# Patient Record
Sex: Male | Born: 1965 | Race: Black or African American | Hispanic: No | Marital: Married | Smoking: Former smoker
Health system: Southern US, Community
[De-identification: ages and names within clinical notes are randomized; demographics above are authoritative.]

## PROBLEM LIST (undated history)

## (undated) DIAGNOSIS — T7840XA Allergy, unspecified, initial encounter: Secondary | ICD-10-CM

## (undated) DIAGNOSIS — B351 Tinea unguium: Secondary | ICD-10-CM

## (undated) DIAGNOSIS — R220 Localized swelling, mass and lump, head: Secondary | ICD-10-CM

## (undated) DIAGNOSIS — E119 Type 2 diabetes mellitus without complications: Secondary | ICD-10-CM

## (undated) DIAGNOSIS — I10 Essential (primary) hypertension: Secondary | ICD-10-CM

## (undated) DIAGNOSIS — J309 Allergic rhinitis, unspecified: Secondary | ICD-10-CM

## (undated) DIAGNOSIS — K219 Gastro-esophageal reflux disease without esophagitis: Secondary | ICD-10-CM

## (undated) DIAGNOSIS — E785 Hyperlipidemia, unspecified: Secondary | ICD-10-CM

## (undated) HISTORY — PX: HERNIA REPAIR: SHX51

## (undated) HISTORY — DX: Type 2 diabetes mellitus without complications: E11.9

## (undated) HISTORY — DX: Tinea unguium: B35.1

## (undated) HISTORY — DX: Gastro-esophageal reflux disease without esophagitis: K21.9

## (undated) HISTORY — DX: Essential (primary) hypertension: I10

## (undated) HISTORY — DX: Hyperlipidemia, unspecified: E78.5

## (undated) HISTORY — DX: Allergic rhinitis, unspecified: J30.9

## (undated) HISTORY — DX: Localized swelling, mass and lump, head: R22.0

## (undated) HISTORY — DX: Allergy, unspecified, initial encounter: T78.40XA

---

## 2000-06-02 ENCOUNTER — Encounter: Admission: RE | Admit: 2000-06-02 | Discharge: 2000-06-02 | Payer: Self-pay | Admitting: Family Medicine

## 2000-06-02 ENCOUNTER — Encounter: Admission: RE | Admit: 2000-06-02 | Discharge: 2000-08-31 | Payer: Self-pay | Admitting: *Deleted

## 2000-07-08 ENCOUNTER — Encounter: Admission: RE | Admit: 2000-07-08 | Discharge: 2000-07-08 | Payer: Self-pay | Admitting: Sports Medicine

## 2000-07-22 ENCOUNTER — Encounter: Admission: RE | Admit: 2000-07-22 | Discharge: 2000-07-22 | Payer: Self-pay | Admitting: Family Medicine

## 2000-12-19 ENCOUNTER — Encounter: Admission: RE | Admit: 2000-12-19 | Discharge: 2000-12-19 | Payer: Self-pay | Admitting: Family Medicine

## 2001-03-23 ENCOUNTER — Encounter: Admission: RE | Admit: 2001-03-23 | Discharge: 2001-03-23 | Payer: Self-pay | Admitting: Family Medicine

## 2001-07-22 ENCOUNTER — Encounter: Admission: RE | Admit: 2001-07-22 | Discharge: 2001-07-22 | Payer: Self-pay | Admitting: Family Medicine

## 2001-07-31 ENCOUNTER — Encounter: Admission: RE | Admit: 2001-07-31 | Discharge: 2001-07-31 | Payer: Self-pay | Admitting: Family Medicine

## 2001-08-04 ENCOUNTER — Encounter: Admission: RE | Admit: 2001-08-04 | Discharge: 2001-08-04 | Payer: Self-pay | Admitting: Family Medicine

## 2002-02-08 ENCOUNTER — Encounter: Admission: RE | Admit: 2002-02-08 | Discharge: 2002-02-08 | Payer: Self-pay | Admitting: Family Medicine

## 2002-03-11 ENCOUNTER — Encounter: Admission: RE | Admit: 2002-03-11 | Discharge: 2002-03-11 | Payer: Self-pay | Admitting: Family Medicine

## 2002-03-24 ENCOUNTER — Encounter: Admission: RE | Admit: 2002-03-24 | Discharge: 2002-03-24 | Payer: Self-pay | Admitting: Family Medicine

## 2002-05-07 ENCOUNTER — Encounter: Admission: RE | Admit: 2002-05-07 | Discharge: 2002-05-07 | Payer: Self-pay | Admitting: Family Medicine

## 2002-05-10 ENCOUNTER — Encounter: Admission: RE | Admit: 2002-05-10 | Discharge: 2002-05-10 | Payer: Self-pay | Admitting: Family Medicine

## 2002-06-14 ENCOUNTER — Encounter: Admission: RE | Admit: 2002-06-14 | Discharge: 2002-06-14 | Payer: Self-pay | Admitting: Family Medicine

## 2002-10-21 ENCOUNTER — Encounter: Admission: RE | Admit: 2002-10-21 | Discharge: 2002-10-21 | Payer: Self-pay | Admitting: Family Medicine

## 2002-10-26 ENCOUNTER — Encounter: Admission: RE | Admit: 2002-10-26 | Discharge: 2002-10-26 | Payer: Self-pay | Admitting: Family Medicine

## 2003-03-02 ENCOUNTER — Encounter: Admission: RE | Admit: 2003-03-02 | Discharge: 2003-03-02 | Payer: Self-pay | Admitting: Family Medicine

## 2003-03-09 ENCOUNTER — Encounter: Admission: RE | Admit: 2003-03-09 | Discharge: 2003-03-09 | Payer: Self-pay | Admitting: Family Medicine

## 2003-03-28 ENCOUNTER — Encounter: Admission: RE | Admit: 2003-03-28 | Discharge: 2003-03-28 | Payer: Self-pay | Admitting: Family Medicine

## 2003-04-11 ENCOUNTER — Encounter: Admission: RE | Admit: 2003-04-11 | Discharge: 2003-04-11 | Payer: Self-pay | Admitting: Family Medicine

## 2003-07-21 ENCOUNTER — Encounter: Admission: RE | Admit: 2003-07-21 | Discharge: 2003-07-21 | Payer: Self-pay | Admitting: Family Medicine

## 2004-01-26 ENCOUNTER — Ambulatory Visit: Payer: Self-pay | Admitting: Family Medicine

## 2005-03-18 ENCOUNTER — Ambulatory Visit: Payer: Self-pay | Admitting: Sports Medicine

## 2005-04-29 ENCOUNTER — Ambulatory Visit: Payer: Self-pay | Admitting: Sports Medicine

## 2005-08-01 ENCOUNTER — Ambulatory Visit: Payer: Self-pay | Admitting: Family Medicine

## 2006-06-05 DIAGNOSIS — E669 Obesity, unspecified: Secondary | ICD-10-CM | POA: Insufficient documentation

## 2006-06-05 DIAGNOSIS — E7800 Pure hypercholesterolemia, unspecified: Secondary | ICD-10-CM

## 2006-06-05 DIAGNOSIS — E119 Type 2 diabetes mellitus without complications: Secondary | ICD-10-CM

## 2006-06-05 DIAGNOSIS — I10 Essential (primary) hypertension: Secondary | ICD-10-CM | POA: Insufficient documentation

## 2006-06-05 DIAGNOSIS — E66811 Obesity, class 1: Secondary | ICD-10-CM | POA: Insufficient documentation

## 2006-06-05 DIAGNOSIS — E1169 Type 2 diabetes mellitus with other specified complication: Secondary | ICD-10-CM | POA: Insufficient documentation

## 2008-03-08 ENCOUNTER — Encounter (INDEPENDENT_AMBULATORY_CARE_PROVIDER_SITE_OTHER): Payer: Self-pay | Admitting: *Deleted

## 2008-04-13 ENCOUNTER — Telehealth: Payer: Self-pay | Admitting: *Deleted

## 2008-04-13 ENCOUNTER — Encounter: Payer: Self-pay | Admitting: Family Medicine

## 2008-04-13 ENCOUNTER — Ambulatory Visit: Payer: Self-pay | Admitting: Family Medicine

## 2008-04-13 DIAGNOSIS — B351 Tinea unguium: Secondary | ICD-10-CM | POA: Insufficient documentation

## 2008-04-13 LAB — CONVERTED CEMR LAB
AST: 20 units/L (ref 0–37)
Albumin: 4.6 g/dL (ref 3.5–5.2)
Alkaline Phosphatase: 64 units/L (ref 39–117)
Hgb A1c MFr Bld: 6.1 %
LDL Cholesterol: 145 mg/dL — ABNORMAL HIGH (ref 0–99)
Potassium: 4.1 meq/L (ref 3.5–5.3)
Sodium: 143 meq/L (ref 135–145)
Total Protein: 7.4 g/dL (ref 6.0–8.3)
VLDL: 22 mg/dL (ref 0–40)

## 2008-05-10 ENCOUNTER — Telehealth: Payer: Self-pay | Admitting: *Deleted

## 2008-06-10 ENCOUNTER — Ambulatory Visit: Payer: Self-pay | Admitting: Family Medicine

## 2008-08-29 ENCOUNTER — Encounter: Payer: Self-pay | Admitting: *Deleted

## 2008-09-19 ENCOUNTER — Ambulatory Visit: Payer: Self-pay | Admitting: Family Medicine

## 2008-09-19 ENCOUNTER — Encounter: Payer: Self-pay | Admitting: Family Medicine

## 2008-09-19 LAB — CONVERTED CEMR LAB
ALT: 31 units/L (ref 0–53)
AST: 19 units/L (ref 0–37)
CO2: 22 meq/L (ref 19–32)
Calcium: 9.9 mg/dL (ref 8.4–10.5)
Chloride: 109 meq/L (ref 96–112)
Direct LDL: 70 mg/dL
Sodium: 140 meq/L (ref 135–145)
Total Bilirubin: 0.4 mg/dL (ref 0.3–1.2)
Total Protein: 6.6 g/dL (ref 6.0–8.3)

## 2009-01-02 ENCOUNTER — Ambulatory Visit: Payer: Self-pay | Admitting: Family Medicine

## 2009-06-13 ENCOUNTER — Encounter: Payer: Self-pay | Admitting: Family Medicine

## 2009-09-13 ENCOUNTER — Encounter: Payer: Self-pay | Admitting: Family Medicine

## 2009-11-07 ENCOUNTER — Telehealth: Payer: Self-pay | Admitting: Family Medicine

## 2009-11-07 ENCOUNTER — Ambulatory Visit: Payer: Self-pay | Admitting: Family Medicine

## 2009-11-07 DIAGNOSIS — R358 Other polyuria: Secondary | ICD-10-CM

## 2009-11-07 LAB — CONVERTED CEMR LAB
Ketones, urine, test strip: NEGATIVE
Nitrite: NEGATIVE
Urobilinogen, UA: 0.2

## 2009-11-09 ENCOUNTER — Encounter (INDEPENDENT_AMBULATORY_CARE_PROVIDER_SITE_OTHER): Payer: Self-pay | Admitting: *Deleted

## 2009-11-09 ENCOUNTER — Ambulatory Visit: Payer: Self-pay | Admitting: Family Medicine

## 2009-11-09 LAB — CONVERTED CEMR LAB
BUN: 23 mg/dL (ref 6–23)
Bilirubin, Direct: 0.1 mg/dL (ref 0.0–0.3)
Calcium: 10.4 mg/dL (ref 8.4–10.5)
Chloride: 105 meq/L (ref 96–112)
Cholesterol: 191 mg/dL (ref 0–200)
Creatinine, Ser: 1.1 mg/dL (ref 0.4–1.5)
Creatinine,U: 200.6 mg/dL
GFR calc non Af Amer: 97.58 mL/min (ref >60)
Hgb A1c MFr Bld: 6.1 % (ref 4.6–6.5)
LDL Cholesterol: 126 mg/dL — ABNORMAL HIGH (ref 0–99)
Microalb Creat Ratio: 0.3 mg/g (ref 0.0–30.0)
Microalb, Ur: 0.6 mg/dL (ref 0.0–1.9)
Total Bilirubin: 0.7 mg/dL (ref 0.3–1.2)
Total CHOL/HDL Ratio: 4
Triglycerides: 103 mg/dL (ref 0.0–149.0)
VLDL: 20.6 mg/dL (ref 0.0–40.0)

## 2009-12-12 ENCOUNTER — Ambulatory Visit: Payer: Self-pay | Admitting: Internal Medicine

## 2009-12-26 ENCOUNTER — Ambulatory Visit: Payer: Self-pay | Admitting: Internal Medicine

## 2010-02-07 LAB — CONVERTED CEMR LAB
Calcium: 10.3 mg/dL (ref 8.4–10.5)
GFR calc non Af Amer: 113.25 mL/min (ref >60)
Glucose, Bld: 91 mg/dL (ref 70–99)
PSA: 0.94 ng/mL (ref 0.10–4.00)
Potassium: 4.2 meq/L (ref 3.5–5.1)
Sodium: 136 meq/L (ref 135–145)

## 2010-05-08 NOTE — Progress Notes (Signed)
Summary: Medication update  Phone Note Outgoing Call   Summary of Call: I restarted patient on both lisinopril and amlodipine for blood pressure control and lipitor for chol.  can we call patient and ask him to only restart LISINOPRIL, not AMLIDOPINE for blood pressure?  and restart LIPITOR.  If he's able to keep track of BPs, if staying high after 1 wk (>140/90), to restart Amlodipine as well.  I just don't want him to drop too much (given he did have a 5lb weight loss since last visit) Initial call taken by: Eustaquio Boyden  MD,  November 07, 2009 3:08 PM  Follow-up for Phone Call        Patient notified and will restart Lisinopril and Lipitor. He will keep track of B/P's and restart Amplodipine if B/P remains higher than 140/90.  Follow-up by: Janee Morn CMA Duncan Dull),  November 07, 2009 3:18 PM

## 2010-05-08 NOTE — Letter (Signed)
Summary: Generic Letter  [facility]  [address]   [place], [place] [address]   Phone: [phone]  Fax: [phone]    09/13/2009  [name] [address] [place], [place]  [address]  Dear Mr. Boisselle,    We have not seen you in clinic since 2010.  It is important to receive regular medical care, especially with your diabetes and hypertension.  I will be leaving the practice at the end of this month.  Please call us for an appointment either with me or to meet your new doctor.    Call us with questions.    Sincerely,   Eustaquio Boyden  MD  Appended Document: Generic Letter letter mailed.

## 2010-05-08 NOTE — Miscellaneous (Signed)
Summary: prior auth lipitor   Clinical Lists Changes prior auth for Lipitor done & to pcp to sign.[place] RN  June 13, 2009 11:25 AM  have filled out to start coverage review by Vic Blackbird  MD  June 14, 2009 12:32 PM   Appended Document: prior auth lipitor [facility] faxed back that prior auth not needed. faxed to his pharmacy

## 2010-05-08 NOTE — Assessment & Plan Note (Signed)
Summary: TRANSFER FROM [facility]/CPX/CLE   Vital Signs:  Patient profile:   45 year old male Height:      74 inches Weight:      247 pounds BMI:     31.83 Temp:     98.9 degrees F oral Pulse rate:   74 / minute Pulse rhythm:   regular BP sitting:   152 / 90  (left arm) Cuff size:   large  Vitals Entered By: Selena Batten Dance CMA Duncan Dull) (November 07, 2009 2:06 PM) CC: CPx   History of Present Illness: CC: CPE and re establish  1. polyuria - increased frequency over last week.  No dysuria, urgency, no hesitancy or trouble with weak stream.  + increase in water.  No fevers, chills, n/v/ abd pain, flank pain.  has been drinking cranberry juice in case infection.  2. DM - last night check cbg was 124.  Last A1c 6.1%.  Previously on insulin and metformin.  No paresthesias.  Not currently on meds ( diet controlled)  3. HTN - Off meds x 3 wks (last sent 1 month scripts 09/2008 with 3 refills).  no HA, vision changes, chest pain, tightness, LE swelling.  was on amlodipine and lisinopril.  4. HLD - on lipitor.  No myalgias.  5. weight - working outside in lawn.  Stays away from salt.    6. preventative - 24 yo father with prostate issues, unclear exactly what it is because father private about health issues.  Wife with family with prostate cancer, so she wants him checked out today.  Not taking meds regularly.  Eating fried/greasy foods.  Current Medications (verified): 1)  Lisinopril 20 Mg Tabs (Lisinopril) .... Take One By Mouth Daily. 2)  Amlodipine Besylate 10 Mg Tabs (Amlodipine Besylate) .... Take One By Mouth Daily 3)  Cbg Test Strips .... Use As Needed 4)  Lipitor 40 Mg Tabs (Atorvastatin Calcium) .... Take One By Mouth Daily 5)  Claritin 10 Mg Tabs (Loratadine) .Marland Kitchen.. 1 Once Daily As Needed  Allergies (verified): No Known Drug Allergies  Past History:  Past medical, surgical, family and social histories (including risk factors) reviewed, and no changes noted (except as noted  below).  Past Medical History: Reviewed history from 04/13/2008 and no changes required. Allergies (numerous)- used to receive shots weekly Onychomycosis HTN DM HLD  Past Surgical History: Reviewed history from 04/13/2008 and no changes required. Allergy shots q week (Dr. Beaulah Dinning) - 10/21/2002 (not anymore, but interested in resuming)  Family History: Reviewed history from 06/10/2008 and no changes required. 3 children that are healthy (son with asthma), father 73-healthy as of `24, Mother 67-healthy as of `11,  Strong family history of DM 2, aunt on mother side and sister. Also HTN.  no FmHx of CA, heart dz, MI, CVA Thinks 78yo father with prostate issues but unsure what, uncle with bowel surgery 2/2 cancer.  Social History: Reviewed history from 01/02/2009 and no changes required. No tobacco or EtOH. Smokes cigars very ocassionally. Not illicit drug use. 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  To get AA degree.  Lives with wife and 3 children (16, 45 and 4(12/2008)). no pets. Younger son with some trouble with hyperactivity. Tries to exercise and follow a diabetic diet regularly.  Review of Systems  The patient denies anorexia, fever, weight loss, weight gain, hoarseness, chest pain,  syncope, dyspnea on exertion, peripheral edema, prolonged cough, headaches, hemoptysis, abdominal pain, melena, hematuria, incontinence, muscle weakness, and difficulty walking.         o/w per HPI  Physical Exam  General:  Well-developed,well-nourished,in no acute distress; alert,appropriate and cooperative throughout examination Eyes:  No corneal or conjunctival inflammation noted. EOMI. Perrla. Mouth:  Oral mucosa and oropharynx without lesions or exudates.  Teeth in good repair. Neck:  No deformities, masses, or tenderness noted. Lungs:  Normal  respiratory effort, chest expands symmetrically. Lungs are clear to auscultation, no crackles or wheezes. Heart:  Normal rate and regular rhythm. S1 and S2 normal without gallop, murmur, click, rub or other extra sounds. Rectal:  No external abnormalities noted. Normal sphincter tone. No rectal masses or tenderness.  small ext hemorrhoids Prostate:  Prostate gland firm and smooth, no enlargement, nodularity, tenderness, mass, asymmetry or induration. Pulses:  2+ periph pulses Extremities:  no edema Skin:  Intact without suspicious lesions or rashes  Diabetes Management Exam:    Foot Exam (with socks and/or shoes not present):       Sensory-Pinprick/Light touch:          Left medial foot (L-4): normal          Left dorsal foot (L-5): normal          Left lateral foot (S-1): normal          Right medial foot (L-4): normal          Right dorsal foot (L-5): normal          Right lateral foot (S-1): normal       Sensory-Monofilament:          Left foot: normal          Right foot: normal       Inspection:          Left foot: normal          Right foot: normal       Nails:          Left foot: thickened          Right foot: thickened   Impression & Recommendations:  Problem # 1:  POLYURIA (ICD-788.42) UA WNL, no sxs of infection or of BPH or of other systemic issue.  Will monitor for now, likely from increased fluid intake.  To return if red flags.  await blood work.  trace prot, but spgr >1.030  Problem # 2:  HYPERTENSION, BENIGN SYSTEMIC (ICD-401.1) check blood work this week (not today because not fasting).  restart meds today.  His updated medication list for this problem includes:    Lisinopril 20 Mg Tabs (Lisinopril) .Marland Kitchen... Take one by mouth daily.    Amlodipine Besylate 10 Mg Tabs (Amlodipine besylate) .Marland Kitchen... Take one by mouth daily  BP today: 152/90 Prior BP: 151/84 (01/02/2009)  Labs Reviewed: K+: 4.8 (09/19/2008) Creat: : 0.87 (09/19/2008)   Chol: 211 (04/13/2008)   HDL:  44 (04/13/2008)   LDL: 145 (04/13/2008)   TG: 110 (04/13/2008)  Problem # 3:  HYPERCHOLESTEROLEMIA (ICD-272.0) previously well controlled on lipitor, off for weeks.  restart.  check CMP, FLP.  His updated medication list for this problem includes:    Lipitor 40 Mg Tabs (Atorvastatin calcium) .Marland Kitchen... Take one by mouth daily  Labs Reviewed: SGOT: 19 (09/19/2008)   SGPT: 31 (09/19/2008)   HDL:44 (04/13/2008)  LDL:145 (04/13/2008)  Chol:211 (04/13/2008)  Trig:110 (04/13/2008)  Problem # 4:  DIABETES MELLITUS II, UNCOMPLICATED (ICD-250.00) always  been diet controlled.  5lb weight loss since last year.  encouraged to keep it up, watch diet closely, more fruits/vegetables, less fatty greasy and carbs.  check A1c when returns for blood work.  His updated medication list for this problem includes:    Lisinopril 20 Mg Tabs (Lisinopril) .Marland Kitchen... Take one by mouth daily.  Labs Reviewed: Creat: 0.87 (09/19/2008)    Reviewed HgBA1c results: 6.1 (01/02/2009)  6.1  (09/19/2008)  Problem # 5:  OBESITY, NOS (ICD-278.00)  see #4, discussed weight loss interventions including diet changes and increased activity.  Ht: 74 (11/07/2009)   Wt: 247 (11/07/2009)   BMI: 31.83 (11/07/2009)  Complete Medication List: 1)  Lisinopril 20 Mg Tabs (Lisinopril) .... Take one by mouth daily. 2)  Amlodipine Besylate 10 Mg Tabs (Amlodipine besylate) .... Take one by mouth daily 3)  Cbg Test Strips  .... Use as needed 4)  Lipitor 40 Mg Tabs (Atorvastatin calcium) .... Take one by mouth daily 5)  Claritin 10 Mg Tabs (Loratadine) .Marland Kitchen.. 1 once daily as needed  Patient Instructions: 1)  Please return in 1 month for blood pressure f/u. 2)  Please return fasting in the next week in the am for blood work (we open at 8am). 3)  [blood work: FLP (272.0), CMP (272.0), microalb (250.0), A1c (250.0)]. 4)  Blood work today.  Your urine looked normal today.  The increase in urination is probably from increased water intake.  If you  start having fevers, or burning when you void, or your sugars start going up, please return to be seen sooner. 5)  Restart lisinopirl and amlodipine for blood pressure control.  Restart lipitor daily for cholesterol. 6)  Keep watching diet. Prescriptions: LIPITOR 40 MG TABS (ATORVASTATIN CALCIUM) take one by mouth daily  #90 x 3   Entered and Authorized by:   Eustaquio Boyden  MD   Signed by:   Eustaquio Boyden  MD on 11/07/2009   Method used:   Electronically to        [facility] [phone]* (retail)       [address]       [place], [place]  [address]       Ph: [phone]       Fax: [phone]   RxID:   [id number] AMLODIPINE BESYLATE 10 MG TABS (AMLODIPINE BESYLATE) take one by mouth daily  #90 x 3   Entered and Authorized by:   Eustaquio Boyden  MD   Signed by:   Eustaquio Boyden  MD on 11/07/2009   Method used:   Electronically to        [facility] [phone]* (retail)       [address]       [place], [place]  [address]       Ph: [phone]       Fax: [phone]   RxID:   [id number] LISINOPRIL 20 MG TABS (LISINOPRIL) take one by mouth daily.  #90 x 3   Entered and Authorized by:   Eustaquio Boyden  MD   Signed by:   Eustaquio Boyden  MD on 11/07/2009   Method used:   Electronically to        [facility] [phone]* (retail)       [address]       [place], [place]  [address]       Ph: [phone]       Fax: [phone]   RxID:   [id number]   Current Allergies (reviewed  today): No known allergies   Laboratory Results   Urine Tests  Date/Time Received: November 07, 2009 2:38 PM  Date/Time Reported: November 07, 2009 2:38 PM   Routine Urinalysis   Color: yellow Appearance: Clear Glucose: negative   (Normal Range: Negative) Bilirubin: negative   (Normal Range: Negative) Ketone: negative   (Normal Range: Negative) Spec. Gravity: >=1.030   (Normal Range: 1.003-1.035) Blood: negative   (Normal Range: Negative) pH: 6.0   (Normal  Range: 5.0-8.0) Protein: trace   (Normal Range: Negative) Urobilinogen: 0.2   (Normal Range: 0-1) Nitrite: negative   (Normal Range: Negative) Leukocyte Esterace: negative   (Normal Range: Negative)        Prevention & Chronic Care Immunizations   Influenza vaccine: Not documented    Tetanus booster: 03/08/2001: Done.   Tetanus booster due: 03/09/2011    Pneumococcal vaccine: Done.  (03/08/2005)   Pneumococcal vaccine due: None  Other Screening   Smoking status: quit  (01/02/2009)  Diabetes Mellitus   HgbA1C: 6.1  (01/02/2009)   Hemoglobin A1C due: 07/12/2008    Eye exam: Not documented    Foot exam: yes  (11/07/2009)   High risk foot: Not documented   Foot care education: Not documented    Urine microalbumin/creatinine ratio: Not documented    Diabetes flowsheet reviewed?: Yes   Progress toward A1C goal: At goal  Lipids   Total Cholesterol: 211  (04/13/2008)   LDL: 145  (04/13/2008)   LDL Direct: 70  (09/19/2008)   HDL: 44  (04/13/2008)   Triglycerides: 110  (04/13/2008)   Lipid panel due: 04/13/2009    SGOT (AST): 19  (09/19/2008)   SGPT (ALT): 31  (09/19/2008)   Alkaline phosphatase: 51  (09/19/2008)   Total bilirubin: 0.4  (09/19/2008)   Liver panel due: 03/21/2009    Lipid flowsheet reviewed?: Yes   Progress toward LDL goal: At goal  Hypertension   Last Blood Pressure: 152 / 90  (11/07/2009)   Serum creatinine: 0.87  (09/19/2008)   Serum potassium 4.8  (09/19/2008)    Hypertension flowsheet reviewed?: Yes   Progress toward BP goal: Unchanged  Self-Management Support :   Personal Goals (by the next clinic visit) :     Personal A1C goal: 7  (01/02/2009)     Personal blood pressure goal: 130/80  (11/07/2009)     Personal LDL goal: 100  (01/02/2009)    Diabetes self-management support: CBG self-monitoring log, Written self-care plan  (01/02/2009)    Hypertension self-management support: BP self-monitoring log, Written self-care plan,  Education handout  (01/02/2009)    Lipid self-management support: Written self-care plan  (01/02/2009)

## 2010-05-08 NOTE — Assessment & Plan Note (Signed)
Summary: ONE MTH F/U FOR BP CHECK / LFW   Vital Signs:  Patient profile:   45 year old male Weight:      250.50 pounds Temp:     98.7 degrees F oral Pulse rate:   60 / minute Pulse rhythm:   regular BP sitting:   158 / 100  (right arm) Cuff size:   large  Vitals Entered By: Selena Batten Dance CMA (AAMA) (December 12, 2009 8:10 AM) CC: 1 month follow up   History of Present Illness: CC: f/u HTN  1. HTN - no HA, vision changes, chest pain, tightness, urinary changes, LE swelling.  Restarted lisinopril on 3d ago.  Hasn't restarted amlodipine yet.  unable to check BP at home, could go to store.  2. DM borderline - checks sugars sporadically.  diet controlled.  3. HLD - on lipitor 40mg  daily.  4. weight - 3lb up since last visit.  planning on meal changing.  has friend who runs gym, pt planning on going M and W.  5. prostate - discussed last visit.  Father in law passed away last year from Pr CA, father with some prostate issues and catheter currently.  Allergies: No Known Drug Allergies  Past History:  Past Medical History: Allergies (numerous)- used to receive shots weekly Onychomycosis HTN borderline DM HLD PMH-FH-SH reviewed for relevance  Physical Exam  General:  Well-developed,well-nourished,in no acute distress; alert,appropriate and cooperative throughout examination Lungs:  Normal respiratory effort, chest expands symmetrically. Lungs are clear to auscultation, no crackles or wheezes. Heart:  Normal rate and regular rhythm. S1 and S2 normal without gallop, murmur, click, rub or other extra sounds. Extremities:  no edema Skin:  Intact without suspicious lesions or rashes   Impression & Recommendations:  Problem # 1:  HYPERTENSION, BENIGN SYSTEMIC (ICD-401.1) restart amlodipine today.  likely will need it for optimal control.  encourage weight loss and exercising.  check Cr in 2-3 wks. The following medications were removed from the medication list:    Amlodipine  Besylate 10 Mg Tabs (Amlodipine besylate) .Marland Kitchen... Take one by mouth daily His updated medication list for this problem includes:    Amlodipine Besylate 10 Mg Tabs (Amlodipine besylate) ..... One by mouth daily    Lisinopril 20 Mg Tabs (Lisinopril) .Marland Kitchen... Take one by mouth daily.  BP today: 158/100 Prior BP: 152/90 (11/07/2009)  Labs Reviewed: K+: 4.2 (11/09/2009) Creat: : 1.1 (11/09/2009)   Chol: 191 (11/09/2009)   HDL: 44.20 (11/09/2009)   LDL: 126 (11/09/2009)   TG: 103.0 (11/09/2009)  Problem # 2:  SPECIAL SCREENING MALIGNANT NEOPLASM OF PROSTATE (ICD-V76.44) check PSA next visit given family history.  DRE reassuring last visit.  Problem # 3:  OBESITY, NOS (ICD-278.00) encouraged weight loss, discussed plan for increased exercising. Ht: 74 (11/07/2009)   Wt: 250.50 (12/12/2009)   BMI: 31.83 (11/07/2009)  Problem # 4:  HYPERCHOLESTEROLEMIA (ICD-272.0) LDL too high - just restarted lipitor.  recheck chol next time. His updated medication list for this problem includes:    Lipitor 40 Mg Tabs (Atorvastatin calcium) .Marland Kitchen... Take one by mouth daily  Labs Reviewed: SGOT: 24 (11/09/2009)   SGPT: 32 (11/09/2009)   HDL:44.20 (11/09/2009), 44 (04/13/2008)  LDL:126 (11/09/2009), 145 (04/13/2008)  Chol:191 (11/09/2009), 211 (04/13/2008)  Trig:103.0 (11/09/2009), 110 (04/13/2008)  Problem # 5:  DIABETES MELLITUS II, UNCOMPLICATED (ICD-250.00) diet controlled. His updated medication list for this problem includes:    Lisinopril 20 Mg Tabs (Lisinopril) .Marland Kitchen... Take one by mouth daily.  Labs Reviewed: Creat: 1.1 (  11/09/2009)    Reviewed HgBA1c results: 6.1 (11/09/2009)  6.1 (01/02/2009)  Complete Medication List: 1)  Amlodipine Besylate 10 Mg Tabs (Amlodipine besylate) .... One by mouth daily 2)  Lisinopril 20 Mg Tabs (Lisinopril) .... Take one by mouth daily. 3)  Cbg Test Strips  .... Use as needed 4)  Lipitor 40 Mg Tabs (Atorvastatin calcium) .... Take one by mouth daily 5)  Claritin 10 Mg  Tabs (Loratadine) .Marland Kitchen.. 1 once daily as needed  Patient Instructions: 1)  return in 2 weeks for blood work [PSA and BMP, 401.1, V76.44] 2)  Keep an eye on your blood pressure, if staying above 135/85, return in 1 month.  If doing well, return in 2-3 months.   3)  Continue to work on diet changes and exercise changes. 4)  Call clinic with questions. Prescriptions: AMLODIPINE BESYLATE 10 MG TABS (AMLODIPINE BESYLATE) one by mouth daily  #30 x 3   Entered and Authorized by:   Eustaquio Boyden  MD   Signed by:   Eustaquio Boyden  MD on 12/12/2009   Method used:   Electronically to        [facility] [phone]* (retail)       [address]       [place], [place]  [address]       Ph: [phone]       Fax: [phone]   RxID:   [id number]   Current Allergies (reviewed today): No known allergies    Prevention & Chronic Care Immunizations   Influenza vaccine: Not documented    Tetanus booster: 03/08/2001: Done.   Tetanus booster due: 03/09/2011    Pneumococcal vaccine: Done.  (03/08/2005)   Pneumococcal vaccine due: None  Other Screening   Smoking status: quit  (01/02/2009)  Diabetes Mellitus   HgbA1C: 6.1  (11/09/2009)   Hemoglobin A1C due: 07/12/2008    Eye exam: Not documented    Foot exam: yes  (11/07/2009)   High risk foot: Not documented   Foot care education: Not documented    Urine microalbumin/creatinine ratio: 0.3  (11/09/2009)  Lipids   Total Cholesterol: 191  (11/09/2009)   LDL: 126  (11/09/2009)   LDL Direct: 70  (09/19/2008)   HDL: 44.20  (11/09/2009)   Triglycerides: 103.0  (11/09/2009)   Lipid panel due: 04/13/2009    SGOT (AST): 24  (11/09/2009)   SGPT (ALT): 32  (11/09/2009)   Alkaline phosphatase: 57  (11/09/2009)   Total bilirubin: 0.7  (11/09/2009)   Liver panel due: 03/21/2009  Hypertension   Last Blood Pressure: 158 / 100  (12/12/2009)   Serum creatinine: 1.1  (11/09/2009)   Serum potassium 4.2   (11/09/2009)  Self-Management Support :   Personal Goals (by the next clinic visit) :     Personal A1C goal: 7  (01/02/2009)     Personal blood pressure goal: 130/80  (11/07/2009)     Personal LDL goal: 100  (01/02/2009)    Diabetes self-management support: CBG self-monitoring log, Written self-care plan  (01/02/2009)    Hypertension self-management support: BP self-monitoring log, Written self-care plan, Education handout  (01/02/2009)    Lipid self-management support: Written self-care plan  (01/02/2009)

## 2010-05-08 NOTE — Letter (Signed)
Summary: Generic Letter   at [facility]  [address] [place], [place] [address]   Phone: [phone]  Fax: [phone]    11/09/2009  Joseph Galloway [address] [place], [place]  [address]  Dear Joseph Galloway,   Dr. Sharen Hones asked me to inform you that overall your labs looked good. Your A1c (diabetes test) was 6.1%, your kidneys looked okay and so did your liver. Your LDL (bad cholesterol) is 126 which is too high for diabetics, but you just recently restarted the Lipitor, so hopefully with continued use these readings will come down by your next check. Please continue all medications as prescribed.   I have included the lab results for your review and records.Please don't hesitate to call should you have any questions or concerns.        Sincerely,      Selena Batten Dance CMA (AAMA)

## 2010-11-16 ENCOUNTER — Other Ambulatory Visit: Payer: Self-pay | Admitting: *Deleted

## 2010-11-16 MED ORDER — ATORVASTATIN CALCIUM 40 MG PO TABS: 40.0000 mg | ORAL_TABLET | Freq: Every day | ORAL | Status: DC

## 2011-07-18 ENCOUNTER — Other Ambulatory Visit: Payer: Self-pay | Admitting: Family Medicine

## 2011-07-18 DIAGNOSIS — E7800 Pure hypercholesterolemia, unspecified: Secondary | ICD-10-CM

## 2011-07-18 DIAGNOSIS — I10 Essential (primary) hypertension: Secondary | ICD-10-CM

## 2011-07-18 DIAGNOSIS — E119 Type 2 diabetes mellitus without complications: Secondary | ICD-10-CM

## 2011-07-22 ENCOUNTER — Encounter: Payer: Self-pay | Admitting: Family Medicine

## 2011-07-23 ENCOUNTER — Encounter: Payer: Self-pay | Admitting: Family Medicine

## 2011-07-23 ENCOUNTER — Other Ambulatory Visit (INDEPENDENT_AMBULATORY_CARE_PROVIDER_SITE_OTHER): Payer: 59

## 2011-07-23 ENCOUNTER — Ambulatory Visit (INDEPENDENT_AMBULATORY_CARE_PROVIDER_SITE_OTHER): Payer: 59 | Admitting: Family Medicine

## 2011-07-23 VITALS — BP 170/108 | HR 80 | Temp 99.0°F | Ht 73.25 in | Wt 259.8 lb

## 2011-07-23 DIAGNOSIS — E119 Type 2 diabetes mellitus without complications: Secondary | ICD-10-CM

## 2011-07-23 DIAGNOSIS — I10 Essential (primary) hypertension: Secondary | ICD-10-CM

## 2011-07-23 DIAGNOSIS — E7800 Pure hypercholesterolemia, unspecified: Secondary | ICD-10-CM

## 2011-07-23 DIAGNOSIS — Z0000 Encounter for general adult medical examination without abnormal findings: Secondary | ICD-10-CM

## 2011-07-23 LAB — COMPREHENSIVE METABOLIC PANEL
ALT: 53 U/L (ref 0–53)
AST: 27 U/L (ref 0–37)
Albumin: 4.3 g/dL (ref 3.5–5.2)
Calcium: 9.7 mg/dL (ref 8.4–10.5)
Chloride: 105 mEq/L (ref 96–112)
Creatinine, Ser: 1.1 mg/dL (ref 0.4–1.5)
Potassium: 4 mEq/L (ref 3.5–5.1)

## 2011-07-23 LAB — LIPID PANEL
LDL Cholesterol: 119 mg/dL — ABNORMAL HIGH (ref 0–99)
Total CHOL/HDL Ratio: 4

## 2011-07-23 LAB — HEMOGLOBIN A1C: Hgb A1c MFr Bld: 7.7 % — ABNORMAL HIGH (ref 4.6–6.5)

## 2011-07-23 LAB — MICROALBUMIN / CREATININE URINE RATIO: Creatinine,U: 106.8 mg/dL

## 2011-07-23 LAB — TSH: TSH: 2.11 u[IU]/mL (ref 0.35–5.50)

## 2011-07-23 MED ORDER — ATORVASTATIN CALCIUM 40 MG PO TABS: 40.0000 mg | ORAL_TABLET | Freq: Every day | ORAL | Status: DC

## 2011-07-23 MED ORDER — ONETOUCH ULTRA SYSTEM W/DEVICE KIT: 1.0000 | PACK | Freq: Once | Status: DC

## 2011-07-23 MED ORDER — LISINOPRIL-HYDROCHLOROTHIAZIDE 20-12.5 MG PO TABS: 1.0000 | ORAL_TABLET | Freq: Every day | ORAL | Status: DC

## 2011-07-23 MED ORDER — GLUCOSE BLOOD VI STRP: ORAL_STRIP | Status: DC

## 2011-07-23 NOTE — Assessment & Plan Note (Signed)
Some ED to Amlodipine. Start combo pill ACEI/HCTZ. rtc 2 wks for blood work, 1-2 mo for OV. pending blood work rec buy cuff.

## 2011-07-23 NOTE — Assessment & Plan Note (Signed)
Preventative protocols reviewed and updated unless pt declined. Requests prostate exam/PSA next year, deferred this year.

## 2011-07-23 NOTE — Patient Instructions (Signed)
Keep eye on blood pressure.  Goal blood pressure is <130/80.   Start lipitor daily for cholesterol. Start combo pill for blood pressure (lisinopril HCTZ). Return in 2 weeks for labwork, in 1-2 months for blood pressure follow up. Good to see you today, call us with questions.

## 2011-07-23 NOTE — Assessment & Plan Note (Signed)
rec restart lipitor - sent 1 yr supply to pharmacy.

## 2011-07-23 NOTE — Progress Notes (Signed)
Subjective:    Patient ID: Joseph Galloway, male    DOB: 09-29-1965, 46 y.o.   MRN: [id number]  HPI CC: CPE  Off HTN, HLD meds for last several months to year.  Side effect to amlodipine - caused some ED.  Wants different one.  No HA, vision changes, CP/tightness, SOB, leg swelling.   HLD - wants to restart lipitor.  Legs hurting some, swelling some.  "feel different".  Works in Aeronautical engineer.  Lab Results  Component Value Date   HGBA1C 6.1 11/09/2009    Preventative: Last cpe 12/2009. Colon - too young. Prostate - father with prostate cancer at age 64s.  Would like prostate exam but wants to postpone.  Caffeine: rare. Lives with wife and 3 kids; no pets; youngest son has some hyperactivity issues Occupation: Self employed, Youth worker. Edu: AA from [employer], applications) Activity: active at work. Diet: watches carbs and starches, lots of water throughout the day, some fruits/vegetables.  Medications and allergies reviewed and updated in chart.  Past histories reviewed and updated if relevant as below. Patient Active Problem List  Diagnoses  . ONYCHOMYCOSIS, TOENAILS  . DIABETES MELLITUS II, UNCOMPLICATED  . HYPERCHOLESTEROLEMIA  . OBESITY, NOS  . HYPERTENSION, BENIGN SYSTEMIC  . POLYURIA   Past Medical History  Diagnosis Date  . Multiple allergies     Used to receive weekly injections  . Onychomycosis   . HTN (hypertension)   . HLD (hyperlipidemia)   . Diabetes mellitus     borderline   No past surgical history on file. History  Substance Use Topics  . Smoking status: Former Smoker    Types: Cigars  . Smokeless tobacco: Never Used   Comment: Very occasional cigar/had not smoked one in 10 years  . Alcohol Use: No   Family History  Problem Relation Age of Onset  . Asthma Son   . Healthy Father     ? prostate issue  . Healthy Mother   . Diabetes Maternal Aunt   . Diabetes Sister   . Hypertension      family history  . Colon cancer      uncle   No  Known Allergies Current Outpatient Prescriptions on File Prior to Visit  Medication Sig Dispense Refill  . loratadine (CLARITIN) 10 MG tablet Take 10 mg by mouth daily.      Marland Kitchen amLODipine (NORVASC) 10 MG tablet Take 10 mg by mouth daily.      Marland Kitchen atorvastatin (LIPITOR) 40 MG tablet Take 1 tablet (40 mg total) by mouth daily.  30 tablet  0  . lisinopril (PRINIVIL,ZESTRIL) 20 MG tablet Take 20 mg by mouth daily.         Review of Systems  Constitutional: Negative for fever, chills, activity change, appetite change, fatigue and unexpected weight change.  HENT: Negative for hearing loss and neck pain.   Eyes: Negative for visual disturbance.  Respiratory: Positive for cough. Negative for chest tightness, shortness of breath and wheezing.   Cardiovascular: Negative for chest pain, palpitations and leg swelling.  Gastrointestinal: Negative for nausea, vomiting, abdominal pain, diarrhea, constipation, blood in stool and abdominal distention.  Genitourinary: Negative for hematuria and difficulty urinating.  Musculoskeletal: Negative for myalgias and arthralgias.  Skin: Negative for rash.  Neurological: Negative for dizziness, seizures, syncope and headaches.  Hematological: Does not bruise/bleed easily.  Psychiatric/Behavioral: Negative for dysphoric mood. The patient is not nervous/anxious.        Objective:   Physical Exam  Nursing note and  vitals reviewed. Constitutional: He is oriented to person, place, and time. He appears well-developed and well-nourished. No distress.  HENT:  Head: Normocephalic and atraumatic.  Right Ear: External ear normal.  Left Ear: External ear normal.  Nose: Nose normal.  Mouth/Throat: Oropharynx is clear and moist. No oropharyngeal exudate.  Eyes: Conjunctivae and EOM are normal. Pupils are equal, round, and reactive to light. No scleral icterus.  Neck: Normal range of motion. Neck supple. Carotid bruit is not present.  Cardiovascular: Normal rate, regular  rhythm, normal heart sounds and intact distal pulses.   No murmur heard. Pulses:      Radial pulses are 2+ on the right side, and 2+ on the left side.       Dorsalis pedis pulses are 2+ on the right side, and 2+ on the left side.  Pulmonary/Chest: Effort normal and breath sounds normal. No respiratory distress. He has no wheezes. He has no rales.  Abdominal: Soft. Bowel sounds are normal. He exhibits no distension and no mass. There is no tenderness. There is no rebound and no guarding.  Musculoskeletal: Normal range of motion. He exhibits no edema.  Lymphadenopathy:    He has no cervical adenopathy.  Neurological: He is alert and oriented to person, place, and time.       CN grossly intact, station and gait intact  Skin: Skin is warm and dry. No rash noted.  Psychiatric: He has a normal mood and affect. His behavior is normal. Judgment and thought content normal.      Assessment & Plan:

## 2011-07-23 NOTE — Assessment & Plan Note (Signed)
Checking blood work today.  H/o borderline DM in past, always diet controlled.

## 2011-07-31 ENCOUNTER — Encounter: Payer: Self-pay | Admitting: Family Medicine

## 2011-08-04 ENCOUNTER — Other Ambulatory Visit: Payer: Self-pay | Admitting: Family Medicine

## 2011-08-04 DIAGNOSIS — I10 Essential (primary) hypertension: Secondary | ICD-10-CM

## 2011-08-04 DIAGNOSIS — E119 Type 2 diabetes mellitus without complications: Secondary | ICD-10-CM

## 2011-08-06 ENCOUNTER — Other Ambulatory Visit (INDEPENDENT_AMBULATORY_CARE_PROVIDER_SITE_OTHER): Payer: 59

## 2011-08-06 DIAGNOSIS — E119 Type 2 diabetes mellitus without complications: Secondary | ICD-10-CM

## 2011-08-06 DIAGNOSIS — I10 Essential (primary) hypertension: Secondary | ICD-10-CM

## 2011-08-06 LAB — BASIC METABOLIC PANEL
BUN: 21 mg/dL (ref 6–23)
CO2: 28 mEq/L (ref 19–32)
Chloride: 104 mEq/L (ref 96–112)
Creatinine, Ser: 1.1 mg/dL (ref 0.4–1.5)

## 2011-08-27 ENCOUNTER — Telehealth: Payer: Self-pay

## 2011-08-27 ENCOUNTER — Ambulatory Visit (INDEPENDENT_AMBULATORY_CARE_PROVIDER_SITE_OTHER): Payer: 59 | Admitting: Family Medicine

## 2011-08-27 ENCOUNTER — Encounter: Payer: Self-pay | Admitting: Family Medicine

## 2011-08-27 VITALS — BP 132/96 | HR 74 | Temp 98.4°F | Wt 258.0 lb

## 2011-08-27 DIAGNOSIS — I10 Essential (primary) hypertension: Secondary | ICD-10-CM

## 2011-08-27 DIAGNOSIS — B351 Tinea unguium: Secondary | ICD-10-CM

## 2011-08-27 DIAGNOSIS — Z23 Encounter for immunization: Secondary | ICD-10-CM

## 2011-08-27 DIAGNOSIS — E1165 Type 2 diabetes mellitus with hyperglycemia: Secondary | ICD-10-CM

## 2011-08-27 DIAGNOSIS — E7800 Pure hypercholesterolemia, unspecified: Secondary | ICD-10-CM

## 2011-08-27 NOTE — Patient Instructions (Signed)
Good to see you today, call us with uqestions. Return in 2 months for next visit, prior fasting for blood work. Keep an eye on your fasting sugars and let me know next visit how they are running.  If staying elevated, we will start you on diabetes medicines.

## 2011-08-27 NOTE — Telephone Encounter (Signed)
Yes I forgot to provide.  May come in at his convenience.  Routed immunization orderset to [facility].

## 2011-08-27 NOTE — Assessment & Plan Note (Signed)
Chronic, improved control but not yet at goal. rec diet changes, weight loss - 2 mo trial of this. If bp staying elevated, will increase HCTZ in combo pill. H/o ED to amlodipine. Not checking sugars at home.

## 2011-08-27 NOTE — Progress Notes (Signed)
  Subjective:    Patient ID: Joseph Galloway, male    DOB: 10-Jul-1965, 46 y.o.   MRN: [id number]  HPI CC: 1 mo f/u  Seen here last month with uncontrolled HTN, HLD, DM.  Had been off meds.  Stated motivated to restart.  HTN - No HA, vision changes, CP/tightness, SOB, leg swelling.  doesnt check at home.  bp improved.  Compliant and tolerating combo pill well (lisinopril hctz 20/12.5).    DM - not checking sugars.  No meter at home, to pick up at store today.  Vision exam 03/2011.  Foot exam today.  No paresthesias.  HLD - tolerating lipitor nightly, no myalgias.  Issue with toenail fungus bilateral great toes.  Not tender.  Requests Tdap.  Wt Readings from Last 3 Encounters:  08/27/11 258 lb (117.028 kg)  07/23/11 259 lb 12 oz (117.822 kg)  12/12/09 250 lb 8 oz (113.626 kg)    BP Readings from Last 3 Encounters:  08/27/11 132/96  07/23/11 170/108  12/12/09 158/100    Review of Systems Per HPI    Objective:   Physical Exam  Nursing note and vitals reviewed. Constitutional: He appears well-developed and well-nourished. No distress.  HENT:  Head: Normocephalic and atraumatic.  Nose: Nose normal.  Mouth/Throat: Oropharynx is clear and moist. No oropharyngeal exudate.  Neck: Normal range of motion. Neck supple. Carotid bruit is not present.  Cardiovascular: Normal rate, regular rhythm, normal heart sounds and intact distal pulses.   No murmur heard. Pulmonary/Chest: Effort normal and breath sounds normal. No respiratory distress. He has no wheezes. He has no rales.  Musculoskeletal: He exhibits no edema.       Diabetic foot exam: Normal inspection No skin breakdown No calluses  Normal DP/PT pulses Normal sensation to light touch and monofilament Mild onychomycosis bilateral great toes  Lymphadenopathy:    He has no cervical adenopathy.  Skin: Skin is warm and dry. No rash noted.  Psychiatric: He has a normal mood and affect.      Assessment & Plan:

## 2011-08-27 NOTE — Telephone Encounter (Signed)
Pt seen today and thought was going to get Tdap. Last Td 03/08/2001. Please advise.Pt can be reached [phone].

## 2011-08-27 NOTE — Telephone Encounter (Signed)
Patient notified. He will come in tomorrow morning and ask to see me for the injection.

## 2011-08-27 NOTE — Assessment & Plan Note (Signed)
Chronic. Reviewed A1c - deteriorated.  States not compliant with diabetic diet. Encouraged compliance. Will return in 2 mo for recheck after stricter adherence.   If A1c remains elevated, start metformin. Pt agrees with plan.

## 2011-08-27 NOTE — Assessment & Plan Note (Signed)
Chronic, anticipate improvement on lipitor. Recheck in 5-6 mo.

## 2011-08-27 NOTE — Assessment & Plan Note (Signed)
Discussed options. Pt would like to try funginail. If not improved, treat with oral antifungal (terbinafine)

## 2011-08-28 NOTE — Progress Notes (Signed)
Addended by: Josph Macho A on: 08/28/2011 09:46 AM   Modules accepted: Orders

## 2011-10-28 ENCOUNTER — Ambulatory Visit (INDEPENDENT_AMBULATORY_CARE_PROVIDER_SITE_OTHER): Payer: 59 | Admitting: Family Medicine

## 2011-10-28 ENCOUNTER — Other Ambulatory Visit: Payer: Self-pay | Admitting: Family Medicine

## 2011-10-28 ENCOUNTER — Encounter: Payer: Self-pay | Admitting: Family Medicine

## 2011-10-28 ENCOUNTER — Telehealth: Payer: Self-pay

## 2011-10-28 VITALS — BP 140/90 | HR 49 | Temp 98.4°F | Ht 73.0 in | Wt 255.0 lb

## 2011-10-28 DIAGNOSIS — E1165 Type 2 diabetes mellitus with hyperglycemia: Secondary | ICD-10-CM

## 2011-10-28 DIAGNOSIS — E7800 Pure hypercholesterolemia, unspecified: Secondary | ICD-10-CM

## 2011-10-28 DIAGNOSIS — I10 Essential (primary) hypertension: Secondary | ICD-10-CM

## 2011-10-28 MED ORDER — ONETOUCH ULTRA SYSTEM W/DEVICE KIT: 1.0000 | PACK | Freq: Once | Status: DC

## 2011-10-28 MED ORDER — METFORMIN HCL 500 MG PO TABS: 500.0000 mg | ORAL_TABLET | Freq: Every day | ORAL | Status: DC

## 2011-10-28 NOTE — Patient Instructions (Addendum)
Blood work today.  If A1c >7%, we will start new medicine called metformin.   Continue to keep an eye on blood sugars and blood pressure. Use tylenol for muscle strain of neck.  Let me know if worsening.

## 2011-10-28 NOTE — Telephone Encounter (Signed)
[facility] [place] said there is more than one type of one touch ultra glucometer. When pt arrives pharmacist will discuss with pt and then decide on meter.

## 2011-10-28 NOTE — Assessment & Plan Note (Signed)
Compliant with lipitor, consider rechecking FLP in 3 mo.

## 2011-10-28 NOTE — Assessment & Plan Note (Signed)
Chronic, overall stable.  A bit high for h/o DM. Recheck next visit, if remaining elevated, will increase ACEI/HCTZ combo.

## 2011-10-28 NOTE — Progress Notes (Signed)
  Subjective:    Patient ID: Joseph Galloway, male    DOB: October 02, 1965, 46 y.o.   MRN: [id number]  HPI CC: 2 mo f/u  Seen here 07/2011 with uncontrolled HTN, HLD, DM. Had been off meds.  Has restarted.  HTN - bp slightly elevated but improved.  Somewhat bradycardic today as well - but not on any meds to lower rate.  More stressed over last 3 days, attributes elevated bp to that today.  Doesn't check at home. BP Readings from Last 3 Encounters:  10/28/11 140/90  08/27/11 132/96  07/23/11 170/108    DM - has started checking sugars more, yesterday fasting 109, today 120.  No lows.  No paresthesias.  Vision screen was earlier this year.  Foot exam 08/27/2011. Lab Results  Component Value Date   HGBA1C 7.7* 07/23/2011    HLD - compliant with lipitor, no myalgias.  Wt Readings from Last 3 Encounters:  10/28/11 255 lb (115.667 kg)  08/27/11 258 lb (117.028 kg)  07/23/11 259 lb 12 oz (117.822 kg)   weight down 3 lbs since last visit.  Review of Systems Per HPI    Objective:   Physical Exam  Nursing note and vitals reviewed. Constitutional: He appears well-developed and well-nourished. No distress.  HENT:  Head: Normocephalic and atraumatic.  Mouth/Throat: Oropharynx is clear and moist. No oropharyngeal exudate.  Eyes: Conjunctivae and EOM are normal. Pupils are equal, round, and reactive to light. No scleral icterus.  Cardiovascular: Normal rate, regular rhythm, normal heart sounds and intact distal pulses.   No murmur heard. Pulmonary/Chest: Effort normal and breath sounds normal. No respiratory distress. He has no wheezes. He has no rales.  Skin: Skin is warm and dry. No rash noted.  Psychiatric: He has a normal mood and affect.       Assessment & Plan:

## 2011-10-28 NOTE — Assessment & Plan Note (Signed)
Recheck A1c today.  Anticipate improved control, however did discuss if A1c remains >7%, will start metformin.  Discussed common side effects of med. rtc 3 mo.

## 2012-01-28 ENCOUNTER — Ambulatory Visit (INDEPENDENT_AMBULATORY_CARE_PROVIDER_SITE_OTHER): Payer: 59 | Admitting: Family Medicine

## 2012-01-28 ENCOUNTER — Encounter: Payer: Self-pay | Admitting: Family Medicine

## 2012-01-28 VITALS — BP 138/96 | HR 60 | Temp 98.3°F | Ht 73.5 in | Wt 255.8 lb

## 2012-01-28 DIAGNOSIS — I10 Essential (primary) hypertension: Secondary | ICD-10-CM

## 2012-01-28 DIAGNOSIS — E7800 Pure hypercholesterolemia, unspecified: Secondary | ICD-10-CM

## 2012-01-28 DIAGNOSIS — E1165 Type 2 diabetes mellitus with hyperglycemia: Secondary | ICD-10-CM

## 2012-01-28 DIAGNOSIS — E669 Obesity, unspecified: Secondary | ICD-10-CM

## 2012-01-28 LAB — BASIC METABOLIC PANEL
CO2: 31 mEq/L (ref 19–32)
Chloride: 103 mEq/L (ref 96–112)
Creatinine, Ser: 1.1 mg/dL (ref 0.4–1.5)
Potassium: 4.2 mEq/L (ref 3.5–5.1)
Sodium: 137 mEq/L (ref 135–145)

## 2012-01-28 MED ORDER — GLUCOSE BLOOD VI STRP: ORAL_STRIP | Status: DC

## 2012-01-28 MED ORDER — LISINOPRIL-HYDROCHLOROTHIAZIDE 20-25 MG PO TABS: 1.0000 | ORAL_TABLET | Freq: Every day | ORAL | Status: DC

## 2012-01-28 MED ORDER — HYDROCHLOROTHIAZIDE 12.5 MG PO CAPS: 12.5000 mg | ORAL_CAPSULE | Freq: Every day | ORAL | Status: DC

## 2012-01-28 MED ORDER — ONETOUCH ULTRASOFT LANCETS MISC: Status: DC

## 2012-01-28 NOTE — Progress Notes (Signed)
  Subjective:    Patient ID: Joseph Galloway, male    DOB: 10-Aug-1965, 46 y.o.   MRN: [id number]  HPI CC: 79mo f/u HTN, DM  Prior uncontrolled chronic issues, had been off meds.  Restarted on meds earlier this year, improved control noted.  DM - doesn't check sugars regularly.  This am was 95.  No lows. No paresthesias. Vision screen was earlier this year. Foot exam 08/27/2011.  Last visit metformin started. Lab Results  Component Value Date   HGBA1C 7.3* 10/28/2011    HTN - No HA, vision changes, CP/tightness, SOB, leg swelling.  Compliant with lisinopril/hctz 20/12.5mg  daily. BP Readings from Last 3 Encounters:  01/28/12 138/96  10/28/11 140/90  08/27/11 132/96    HLD - compliant with lipitor, no myalgias. Wt Readings from Last 3 Encounters:  01/28/12 255 lb 12 oz (116.007 kg)  10/28/11 255 lb (115.667 kg)  08/27/11 258 lb (117.028 kg)  wife on diet, has lost 22 lbs.  Pt starting to eat better.  Review of Systems Per HPI    Objective:   Physical Exam  Nursing note and vitals reviewed. Constitutional: He appears well-developed and well-nourished. No distress.  HENT:  Head: Normocephalic and atraumatic.  Right Ear: External ear normal.  Left Ear: External ear normal.  Nose: Nose normal.  Mouth/Throat: Oropharynx is clear and moist. No oropharyngeal exudate.  Eyes: Conjunctivae normal and EOM are normal. Pupils are equal, round, and reactive to light. No scleral icterus.  Neck: Normal range of motion. Neck supple.  Cardiovascular: Normal rate, regular rhythm, normal heart sounds and intact distal pulses.   No murmur heard. Pulmonary/Chest: Effort normal and breath sounds normal. No respiratory distress. He has no wheezes. He has no rales.  Musculoskeletal: He exhibits no edema.       Diabetic foot exam: Normal inspection No skin breakdown No calluses  Normal DP/PT pulses Normal sensation to light tough and monofilament Nails normal  Lymphadenopathy:    He has no  cervical adenopathy.  Skin: Skin is warm and dry. No rash noted.  Psychiatric: He has a normal mood and affect.       Assessment & Plan:

## 2012-01-28 NOTE — Assessment & Plan Note (Signed)
Chronic, stable. Continue lipitor.  

## 2012-01-28 NOTE — Patient Instructions (Signed)
Good to see you. Increased hctz component to 25mg  total (take 12.5mg  daily for 3 months then fill new combo pill prescription for 20/25mg ). Call us with questions. Flu shot today. Keep working on weight loss.

## 2012-01-28 NOTE — Assessment & Plan Note (Signed)
Chronic, stable. Increased hctz component today.

## 2012-01-28 NOTE — Assessment & Plan Note (Signed)
Body mass index is 33.28 kg/(m^2). Encouraged weight loss by increased activity and watching diet.

## 2012-01-28 NOTE — Assessment & Plan Note (Signed)
Chronic, anticipate improved control. Sent in test strips and lancets. Tolerating metformin well.

## 2012-01-28 NOTE — Addendum Note (Signed)
Addended by: Eustaquio Boyden on: 01/28/2012 08:29 AM   Modules accepted: Orders

## 2012-07-28 ENCOUNTER — Ambulatory Visit: Payer: 59 | Admitting: Family Medicine

## 2012-07-29 ENCOUNTER — Other Ambulatory Visit: Payer: Self-pay | Admitting: Family Medicine

## 2012-07-29 ENCOUNTER — Encounter: Payer: Self-pay | Admitting: Family Medicine

## 2012-07-29 ENCOUNTER — Ambulatory Visit (INDEPENDENT_AMBULATORY_CARE_PROVIDER_SITE_OTHER): Payer: 59 | Admitting: Family Medicine

## 2012-07-29 VITALS — BP 126/82 | HR 60 | Temp 98.3°F | Wt 263.5 lb

## 2012-07-29 DIAGNOSIS — R0789 Other chest pain: Secondary | ICD-10-CM

## 2012-07-29 DIAGNOSIS — I10 Essential (primary) hypertension: Secondary | ICD-10-CM

## 2012-07-29 DIAGNOSIS — J309 Allergic rhinitis, unspecified: Secondary | ICD-10-CM

## 2012-07-29 DIAGNOSIS — E7800 Pure hypercholesterolemia, unspecified: Secondary | ICD-10-CM

## 2012-07-29 DIAGNOSIS — E119 Type 2 diabetes mellitus without complications: Secondary | ICD-10-CM

## 2012-07-29 LAB — HEMOGLOBIN A1C: Hgb A1c MFr Bld: 8 % — ABNORMAL HIGH (ref 4.6–6.5)

## 2012-07-29 LAB — BASIC METABOLIC PANEL
CO2: 28 mEq/L (ref 19–32)
Chloride: 101 mEq/L (ref 96–112)
Glucose, Bld: 136 mg/dL — ABNORMAL HIGH (ref 70–99)
Potassium: 4.2 mEq/L (ref 3.5–5.1)
Sodium: 136 mEq/L (ref 135–145)

## 2012-07-29 MED ORDER — GLUCOSE BLOOD VI STRP: ORAL_STRIP | Status: DC

## 2012-07-29 MED ORDER — METFORMIN HCL 500 MG PO TABS: 500.0000 mg | ORAL_TABLET | Freq: Two times a day (BID) | ORAL | Status: DC

## 2012-07-29 MED ORDER — ONETOUCH ULTRASOFT LANCETS MISC: Status: DC

## 2012-07-29 MED ORDER — FLUTICASONE PROPIONATE 50 MCG/ACT NA SUSP: 2.0000 | Freq: Every day | NASAL | Status: DC

## 2012-07-29 NOTE — Assessment & Plan Note (Signed)
Sounds atypical, ?GERD related.  Trial of prilosec - samples provided today.  Recommended start zantac or pepcid otc.  If persistent or exertional component, to return for further eval vs referral to cards for risk stratification given risk factors. Baseline EKG today.

## 2012-07-29 NOTE — Assessment & Plan Note (Signed)
Chronic, stable. Continue med. 

## 2012-07-29 NOTE — Assessment & Plan Note (Signed)
Chronic, stable. Recheck today. If stable, rtc 6 mo for physical.

## 2012-07-29 NOTE — Progress Notes (Signed)
  Subjective:    Patient ID: Joseph Galloway, male    DOB: 24-Aug-1965, 47 y.o.   MRN: [id number]  HPI CC: 6 mo f/u  DM - sugars slightly elevated.  Not checking regularly.  This morning fasting 121.  Foot exam today. Lab Results  Component Value Date   HGBA1C 6.4 01/28/2012     HTN - No HA, vision changes, tightness, SOB, leg swelling. Compliant with lisinopril hctz daily.  Some ED.  Allergies - acting up.  Congestion, rhinorrhea. Alternates zyrtec and claritin.  Has never tried INS.  Uses nasal saline.  Endorses occasional chest discomfort, thought due to heartburn.  Describes as burning ache in mid chest and left chest worse with certain foods but unsure which.  No jaw pain, nausea, diaphoresis or dyspnea, not exertional, not relieved with rest.  Not reproducible.  BP Readings from Last 3 Encounters:  07/29/12 126/82  01/28/12 138/96  10/28/11 140/90     Past Medical History  Diagnosis Date  . Multiple allergies     Used to receive weekly injections  . Onychomycosis   . HTN (hypertension)   . HLD (hyperlipidemia)   . Diabetes mellitus     diet controlled   Family History  Problem Relation Age of Onset  . Asthma Son   . Healthy Father     ? prostate issue  . Healthy Mother   . Diabetes Maternal Aunt   . Diabetes Sister   . Hypertension      family history  . Colon cancer      uncle    Review of Systems Per HPI    Objective:   Physical Exam  Nursing note and vitals reviewed. Constitutional: He appears well-developed and well-nourished. No distress.  HENT:  Head: Normocephalic and atraumatic.  Nose: Nose normal.  Mouth/Throat: Oropharynx is clear and moist. No oropharyngeal exudate.  Eyes: Conjunctivae and EOM are normal. Pupils are equal, round, and reactive to light. No scleral icterus.  Neck: Normal range of motion. Neck supple. Carotid bruit is not present.  Cardiovascular: Normal rate, regular rhythm, normal heart sounds and intact distal pulses.   No  murmur heard. Pulmonary/Chest: Effort normal and breath sounds normal. No respiratory distress. He has no wheezes. He has no rales. He exhibits no tenderness.  Musculoskeletal: He exhibits no edema.  Diabetic foot exam: Normal inspection No skin breakdown No calluses  Normal DP/PT pulses Normal sensation to light touch and monofilament Nails normal  Skin: Skin is warm and dry. No rash noted.  Psychiatric: He has a normal mood and affect.       Assessment & Plan:

## 2012-07-29 NOTE — Assessment & Plan Note (Signed)
Chronic, stable. Continue lipitor.  

## 2012-07-29 NOTE — Patient Instructions (Signed)
Continue meds as up to now. I've sent in flonase for allergies - 2 sprays into each nostril daily.  If nosebleeds, stop Chest discomfort sounds like heartburn - try over the counter zantac or pepcid (prilosec samples provided today as well). If persistent, return to see me. EKG today. Return in 6 months for physical, prior fasting for blood work.

## 2012-07-29 NOTE — Assessment & Plan Note (Signed)
Alternates zyrtec/claritin. Start flonase. Continue nasal saline.

## 2012-07-30 ENCOUNTER — Other Ambulatory Visit: Payer: Self-pay | Admitting: *Deleted

## 2012-07-30 ENCOUNTER — Encounter: Payer: Self-pay | Admitting: *Deleted

## 2012-07-30 MED ORDER — METFORMIN HCL 500 MG PO TABS: 500.0000 mg | ORAL_TABLET | Freq: Two times a day (BID) | ORAL | Status: DC

## 2012-08-03 ENCOUNTER — Other Ambulatory Visit: Payer: Self-pay | Admitting: *Deleted

## 2012-08-03 MED ORDER — ONETOUCH DELICA LANCETS 33G MISC: 1.0000 | Freq: Every day | Status: DC

## 2012-08-07 ENCOUNTER — Other Ambulatory Visit: Payer: Self-pay | Admitting: Family Medicine

## 2012-10-15 ENCOUNTER — Other Ambulatory Visit: Payer: Self-pay

## 2012-10-26 ENCOUNTER — Ambulatory Visit
Admission: RE | Admit: 2012-10-26 | Discharge: 2012-10-26 | Disposition: A | Discharge status: Home / Self Care | Payer: 59 | Source: Ambulatory Visit | Attending: Family Medicine | Admitting: Family Medicine

## 2012-10-26 ENCOUNTER — Ambulatory Visit (INDEPENDENT_AMBULATORY_CARE_PROVIDER_SITE_OTHER): Payer: 59 | Admitting: Family Medicine

## 2012-10-26 DIAGNOSIS — R590 Localized enlarged lymph nodes: Secondary | ICD-10-CM

## 2012-10-26 DIAGNOSIS — R599 Enlarged lymph nodes, unspecified: Secondary | ICD-10-CM

## 2012-10-26 DIAGNOSIS — R221 Localized swelling, mass and lump, neck: Secondary | ICD-10-CM | POA: Insufficient documentation

## 2012-10-26 DIAGNOSIS — E1165 Type 2 diabetes mellitus with hyperglycemia: Secondary | ICD-10-CM

## 2012-10-26 DIAGNOSIS — I10 Essential (primary) hypertension: Secondary | ICD-10-CM

## 2012-10-26 DIAGNOSIS — K219 Gastro-esophageal reflux disease without esophagitis: Secondary | ICD-10-CM

## 2012-10-26 DIAGNOSIS — E119 Type 2 diabetes mellitus without complications: Secondary | ICD-10-CM

## 2012-10-26 NOTE — Progress Notes (Signed)
See scanned document.

## 2012-11-02 ENCOUNTER — Other Ambulatory Visit (INDEPENDENT_AMBULATORY_CARE_PROVIDER_SITE_OTHER): Payer: 59

## 2012-11-02 DIAGNOSIS — R590 Localized enlarged lymph nodes: Secondary | ICD-10-CM

## 2012-11-02 DIAGNOSIS — E119 Type 2 diabetes mellitus without complications: Secondary | ICD-10-CM

## 2012-11-02 DIAGNOSIS — R599 Enlarged lymph nodes, unspecified: Secondary | ICD-10-CM

## 2012-11-02 LAB — CBC WITH DIFFERENTIAL/PLATELET
Basophils Absolute: 0 10*3/uL (ref 0.0–0.1)
Basophils Relative: 0.5 % (ref 0.0–3.0)
Eosinophils Relative: 3.4 % (ref 0.0–5.0)
Hemoglobin: 13.5 g/dL (ref 13.0–17.0)
Lymphocytes Relative: 29.4 % (ref 12.0–46.0)
Monocytes Relative: 9.3 % (ref 3.0–12.0)
Neutro Abs: 4 10*3/uL (ref 1.4–7.7)
RBC: 4.16 Mil/uL — ABNORMAL LOW (ref 4.22–5.81)
WBC: 7.1 10*3/uL (ref 4.5–10.5)

## 2012-11-02 LAB — BASIC METABOLIC PANEL
Calcium: 10.2 mg/dL (ref 8.4–10.5)
GFR: 95.2 mL/min (ref >60.00)
Potassium: 4.1 mEq/L (ref 3.5–5.1)
Sodium: 138 mEq/L (ref 135–145)

## 2012-11-02 LAB — HEMOGLOBIN A1C: Hgb A1c MFr Bld: 7.3 % — ABNORMAL HIGH (ref 4.6–6.5)

## 2012-11-03 ENCOUNTER — Other Ambulatory Visit: Payer: Self-pay | Admitting: Family Medicine

## 2012-11-03 MED ORDER — METFORMIN HCL 500 MG PO TABS: ORAL_TABLET | ORAL | Status: DC

## 2012-11-06 DIAGNOSIS — R220 Localized swelling, mass and lump, head: Secondary | ICD-10-CM

## 2012-11-06 HISTORY — DX: Localized swelling, mass and lump, head: R22.0

## 2012-11-30 ENCOUNTER — Ambulatory Visit (INDEPENDENT_AMBULATORY_CARE_PROVIDER_SITE_OTHER): Payer: 59 | Admitting: Family Medicine

## 2012-11-30 ENCOUNTER — Encounter: Payer: Self-pay | Admitting: Family Medicine

## 2012-11-30 VITALS — BP 138/70 | HR 56 | Temp 98.1°F | Wt 256.8 lb

## 2012-11-30 DIAGNOSIS — R599 Enlarged lymph nodes, unspecified: Secondary | ICD-10-CM

## 2012-11-30 DIAGNOSIS — R590 Localized enlarged lymph nodes: Secondary | ICD-10-CM

## 2012-11-30 NOTE — Assessment & Plan Note (Signed)
Persistent isolated R AC LAD.  Normal CBC, no constitutional sxs.  Normal neck US from 10/2012. Will refer to ENT for further evaluation. Pt agrees with plan.

## 2012-11-30 NOTE — Progress Notes (Signed)
  Subjective:    Patient ID: Joseph Galloway, male    DOB: 1966-02-21, 47 y.o.   MRN: [id number]  HPI CC: f/u throat  Seen here 7/21 (see scanned document) with isolated R AC LAD.  Asked to return in 1 mo for f/u - to recheck neck.  Remains persistent.  Denies significant viral URI sxs.  Some nasal congestion over the last week attributed to allergies. Neck ultrasound 10/2012: THYROID ULTRASOUND  Technique: Ultrasound examination of the thyroid gland and adjacent soft tissues was performed.  Comparison: None.  Findings:  Right thyroid lobe: 48 x 19 x 25 mm, homogeneous echotexture  Left thyroid lobe: 46 x 14 x 19 mm  Isthmus: 2 mm thickness  Focal nodules: None  Lymphadenopathy: None visualized. Bilateral sub centimeter cervical nodes are incidentally noted.  IMPRESSION:  1. Unremarkable thyroid, without focal lesion.  Denies night sweats, fevers,chills, weight loss, fatigue. Increasing heartburn.  Taking OTC nexium which helps control sxs. Wt Readings from Last 3 Encounters:  11/30/12 256 lb 12 oz (116.461 kg)  07/29/12 263 lb 8 oz (119.523 kg)  01/28/12 255 lb 12 oz (116.007 kg)    Lab Results  Component Value Date   WBC 7.1 11/02/2012   HGB 13.5 11/02/2012   HCT 40.1 11/02/2012   MCV 96.3 11/02/2012   PLT 260.0 11/02/2012    Past Medical History  Diagnosis Date  . Allergic rhinitis     Used to receive weekly injections  . Onychomycosis   . HTN (hypertension)   . HLD (hyperlipidemia)   . Diabetes type 2, controlled     Review of Systems   per HPI  Objective:   Physical Exam  Nursing note and vitals reviewed. Constitutional: He appears well-developed and well-nourished. No distress.  HENT:  Head: Normocephalic and atraumatic.  Right Ear: Hearing, tympanic membrane, external ear and ear canal normal.  Left Ear: Hearing, tympanic membrane, external ear and ear canal normal.  Nose: Nose normal. Right sinus exhibits no maxillary sinus tenderness and no frontal sinus  tenderness. Left sinus exhibits no maxillary sinus tenderness and no frontal sinus tenderness.  Mouth/Throat: Uvula is midline, oropharynx is clear and moist and mucous membranes are normal. No oropharyngeal exudate, posterior oropharyngeal edema, posterior oropharyngeal erythema or tonsillar abscesses.  Eyes: Conjunctivae and EOM are normal. Pupils are equal, round, and reactive to light. No scleral icterus.  Neck: Normal range of motion. Neck supple. No thyromegaly present.  Lymphadenopathy:    He has cervical adenopathy (R AC nontender firm LAD about 1.5cm).       Assessment & Plan:

## 2012-11-30 NOTE — Patient Instructions (Signed)
Pass by Marion's office for referral to ENT to further evaluate swollen gland on right side of neck.

## 2012-12-02 ENCOUNTER — Encounter: Payer: Self-pay | Admitting: Family Medicine

## 2013-01-17 ENCOUNTER — Other Ambulatory Visit: Payer: Self-pay | Admitting: Family Medicine

## 2013-01-17 DIAGNOSIS — I10 Essential (primary) hypertension: Secondary | ICD-10-CM

## 2013-01-17 DIAGNOSIS — E119 Type 2 diabetes mellitus without complications: Secondary | ICD-10-CM

## 2013-01-17 DIAGNOSIS — E7800 Pure hypercholesterolemia, unspecified: Secondary | ICD-10-CM

## 2013-01-25 ENCOUNTER — Other Ambulatory Visit (INDEPENDENT_AMBULATORY_CARE_PROVIDER_SITE_OTHER): Payer: 59

## 2013-01-25 DIAGNOSIS — E119 Type 2 diabetes mellitus without complications: Secondary | ICD-10-CM

## 2013-01-25 DIAGNOSIS — I10 Essential (primary) hypertension: Secondary | ICD-10-CM

## 2013-01-25 DIAGNOSIS — E7800 Pure hypercholesterolemia, unspecified: Secondary | ICD-10-CM

## 2013-01-25 LAB — BASIC METABOLIC PANEL
BUN: 21 mg/dL (ref 6–23)
CO2: 31 mEq/L (ref 19–32)
Chloride: 104 mEq/L (ref 96–112)
Glucose, Bld: 107 mg/dL — ABNORMAL HIGH (ref 70–99)
Potassium: 4.2 mEq/L (ref 3.5–5.1)

## 2013-01-25 LAB — LIPID PANEL: VLDL: 17.2 mg/dL (ref 0.0–40.0)

## 2013-01-25 LAB — MICROALBUMIN / CREATININE URINE RATIO
Creatinine,U: 152.3 mg/dL
Microalb, Ur: 0.8 mg/dL (ref 0.0–1.9)

## 2013-02-01 ENCOUNTER — Encounter: Payer: Self-pay | Admitting: Family Medicine

## 2013-02-01 ENCOUNTER — Other Ambulatory Visit: Payer: Self-pay | Admitting: Family Medicine

## 2013-02-01 ENCOUNTER — Ambulatory Visit (INDEPENDENT_AMBULATORY_CARE_PROVIDER_SITE_OTHER): Payer: 59 | Admitting: Family Medicine

## 2013-02-01 VITALS — BP 152/100 | HR 60 | Temp 98.5°F | Ht 73.25 in | Wt 261.5 lb

## 2013-02-01 DIAGNOSIS — R590 Localized enlarged lymph nodes: Secondary | ICD-10-CM

## 2013-02-01 DIAGNOSIS — Z0000 Encounter for general adult medical examination without abnormal findings: Secondary | ICD-10-CM

## 2013-02-01 DIAGNOSIS — E669 Obesity, unspecified: Secondary | ICD-10-CM

## 2013-02-01 DIAGNOSIS — I10 Essential (primary) hypertension: Secondary | ICD-10-CM

## 2013-02-01 DIAGNOSIS — Z23 Encounter for immunization: Secondary | ICD-10-CM

## 2013-02-01 DIAGNOSIS — E119 Type 2 diabetes mellitus without complications: Secondary | ICD-10-CM

## 2013-02-01 DIAGNOSIS — E7800 Pure hypercholesterolemia, unspecified: Secondary | ICD-10-CM

## 2013-02-01 MED ORDER — BLOOD PRESSURE MONITOR/L CUFF MISC: Status: AC

## 2013-02-01 NOTE — Addendum Note (Signed)
Addended by: Josph Macho A on: 02/01/2013 03:47 PM   Modules accepted: Orders

## 2013-02-01 NOTE — Assessment & Plan Note (Signed)
Preventative protocols reviewed and updated unless pt declined. Discussed healthy diet and lifestyle.  

## 2013-02-01 NOTE — Patient Instructions (Addendum)
Flu shot today. Start checking blood pressure at home and let me know if consistently <140/90 Good to see you today, call us with questions. Return in 4 months for follow up, sooner if needed. We will call you with appointment for nutritionist  DASH Diet The DASH diet stands for "Dietary Approaches to Stop Hypertension." It is a healthy eating plan that has been shown to reduce high blood pressure (hypertension) in as little as 14 days, while also possibly providing other significant health benefits. These other health benefits include reducing the risk of breast cancer after menopause and reducing the risk of type 2 diabetes, heart disease, colon cancer, and stroke. Health benefits also include weight loss and slowing kidney failure in patients with chronic kidney disease.  DIET GUIDELINES  Limit salt (sodium). Your diet should contain less than 1500 mg of sodium daily.  Limit refined or processed carbohydrates. Your diet should include mostly whole grains. Desserts and added sugars should be used sparingly.  Include small amounts of heart-healthy fats. These types of fats include nuts, oils, and tub margarine. Limit saturated and trans fats. These fats have been shown to be harmful in the body. CHOOSING FOODS  The following food groups are based on a 2000 calorie diet. See your Registered Dietitian for individual calorie needs. Grains and Grain Products (6 to 8 servings daily)  Eat More Often: Whole-wheat bread, brown rice, whole-grain or wheat pasta, quinoa, popcorn without added fat or salt (air popped).  Eat Less Often: White bread, white pasta, white rice, cornbread. Vegetables (4 to 5 servings daily)  Eat More Often: Fresh, frozen, and canned vegetables. Vegetables may be raw, steamed, roasted, or grilled with a minimal amount of fat.  Eat Less Often/Avoid: Creamed or fried vegetables. Vegetables in a cheese sauce. Fruit (4 to 5 servings daily)  Eat More Often: All fresh, canned  (in natural juice), or frozen fruits. Dried fruits without added sugar. One hundred percent fruit juice ( cup [237 mL] daily).  Eat Less Often: Dried fruits with added sugar. Canned fruit in light or heavy syrup. [employer], Fish, and Poultry (2 servings or less daily. One serving is 3 to 4 oz [85-114 g]).  Eat More Often: Ninety percent or leaner ground beef, tenderloin, sirloin. Round cuts of beef, chicken breast, [place] breast. All fish. Grill, bake, or broil your meat. Nothing should be fried.  Eat Less Often/Avoid: Fatty cuts of meat, [place], or chicken leg, thigh, or wing. Fried cuts of meat or fish. Dairy (2 to 3 servings)  Eat More Often: Low-fat or fat-free milk, low-fat plain or light yogurt, reduced-fat or part-skim cheese.  Eat Less Often/Avoid: Milk (whole, 2%).Whole milk yogurt. Full-fat cheeses. Nuts, Seeds, and Legumes (4 to 5 servings per week)  Eat More Often: All without added salt.  Eat Less Often/Avoid: Salted nuts and seeds, canned beans with added salt. Fats and Sweets (limited)  Eat More Often: Vegetable oils, tub margarines without trans fats, sugar-free gelatin. Mayonnaise and salad dressings.  Eat Less Often/Avoid: Coconut oils, palm oils, butter, stick margarine, cream, half and half, cookies, candy, pie. FOR MORE INFORMATION The Dash Diet Eating Plan: www.dashdiet.org Document Released: 03/14/2011 Document Revised: 06/17/2011 Document Reviewed: 03/14/2011 [facility] Patient Information 2014 [place], [place].

## 2013-02-01 NOTE — Assessment & Plan Note (Signed)
Chronic - elevated today, but did not take meds.   Will monitor at home and notify me if persistently high for change.

## 2013-02-01 NOTE — Assessment & Plan Note (Signed)
Body mass index is 34.25 kg/(m^2).  Pt interested in nutritionist referral - placed today. Encouraged increased activity - incorporating into routine outside of work.

## 2013-02-01 NOTE — Progress Notes (Signed)
Subjective:    Patient ID: Joseph Galloway, male    DOB: 1965-08-07, 47 y.o.   MRN: [id number]  HPI CC: CPE  HTN - elevated BP today but has not taken meds yet.  CP at home not regularly checked.  Feels bp elevated - noticing leg swelling. BP Readings from Last 3 Encounters:  02/01/13 152/100  11/30/12 138/70  07/29/12 126/82    DM - tolerant of metformin .  Recent readings fasting 110s.  No low sugars.  Eye exam 10/2012.  Preventative: fmhx prostate cancer - will start screening next year. No nocturia, no changes in stream. Flu shot today. Pneumovax 2006.  Seat belt use discussed  Wt Readings from Last 3 Encounters:  02/01/13 261 lb 8 oz (118.616 kg)  11/30/12 256 lb 12 oz (116.461 kg)  07/29/12 263 lb 8 oz (119.523 kg)  Body mass index is 34.25 kg/(m^2).  Caffeine: rare. Lives with wife and 3 kids; no pets; youngest son has some hyperactivity issues Occupation: Self employed, Youth worker. Edu: AA from [employer] applications) Activity: active at work. Diet: watches carbs and starches, lots of water throughout the day, some fruits/vegetables  Medications and allergies reviewed and updated in chart.  Past histories reviewed and updated if relevant as below. Patient Active Problem List   Diagnosis Date Noted  . Lymphadenopathy of right cervical region 10/26/2012  . GERD (gastroesophageal reflux disease) 10/26/2012  . Chest discomfort 07/29/2012  . Allergic rhinitis   . Healthcare maintenance 07/23/2011  . ONYCHOMYCOSIS, TOENAILS 04/13/2008  . Diabetes type 2, controlled 06/05/2006  . HYPERCHOLESTEROLEMIA 06/05/2006  . OBESITY, NOS 06/05/2006  . HYPERTENSION, BENIGN SYSTEMIC 06/05/2006   Past Medical History  Diagnosis Date  . Allergic rhinitis     Used to receive weekly injections  . Onychomycosis   . HTN (hypertension)   . HLD (hyperlipidemia)   . Diabetes type 2, controlled   . Mass of right submandibular region 11/2012    eval by ENT - prominent R  submandibular gland, no further w/u   History reviewed. No pertinent past surgical history. History  Substance Use Topics  . Smoking status: Former Smoker    Types: Cigars  . Smokeless tobacco: Never Used     Comment: Very occasional cigar/had not smoked one in 10 years  . Alcohol Use: No   Family History  Problem Relation Age of Onset  . Asthma Son   . Healthy Mother   . Diabetes Maternal Aunt   . Diabetes Sister   . Hypertension      family history  . Cancer Father 13    prostate  . Cancer Paternal Uncle     prostate  . Stroke Neg Hx   . CAD Neg Hx    No Known Allergies Current Outpatient Prescriptions on File Prior to Visit  Medication Sig Dispense Refill  . Ascorbic Acid (VITAMIN C) 1000 MG tablet Take 1,000 mg by mouth daily.      Marland Kitchen atorvastatin (LIPITOR) 40 MG tablet TAKE ONE TABLET BY MOUTH EVERY DAY  90 tablet  1  . Blood Glucose Monitoring Suppl (ONE TOUCH ULTRA SYSTEM KIT) W/DEVICE KIT 1 kit by Does not apply route once.  1 each  11  . cetirizine (ZYRTEC) 10 MG tablet Take 10 mg by mouth daily.      . fluticasone (FLONASE) 50 MCG/ACT nasal spray Place 2 sprays into the nose daily.  16 g  3  . glucose blood test strip Use as instructed, to  check blood sugars daily. 250.00  100 each  3  . lisinopril-hydrochlorothiazide (PRINZIDE,ZESTORETIC) 20-25 MG per tablet Take 1 tablet by mouth daily.  90 tablet  3  . metFORMIN (GLUCOPHAGE) 500 MG tablet Take two pills in the morning and one pill at night  270 tablet  3  . ONETOUCH DELICA LANCETS 33G MISC 1 each by Does not apply route daily. 250.00  100 each  3  . loratadine (CLARITIN) 10 MG tablet Take 10 mg by mouth daily.      . Multiple Vitamin (MULTIVITAMIN) tablet Take 1 tablet by mouth daily.       No current facility-administered medications on file prior to visit.     Review of Systems  Constitutional: Negative for fever, chills, activity change, appetite change, fatigue and unexpected weight change.  HENT:  Negative for hearing loss.   Eyes: Negative for visual disturbance.  Respiratory: Negative for cough, chest tightness, shortness of breath and wheezing.   Cardiovascular: Negative for chest pain, palpitations and leg swelling.  Gastrointestinal: Negative for nausea, vomiting, abdominal pain, diarrhea, constipation, blood in stool and abdominal distention.  Genitourinary: Negative for hematuria and difficulty urinating.  Musculoskeletal: Negative for arthralgias, myalgias and neck pain.  Skin: Negative for rash.  Neurological: Negative for dizziness, seizures, syncope and headaches.  Hematological: Negative for adenopathy. Does not bruise/bleed easily.  Psychiatric/Behavioral: Negative for dysphoric mood. The patient is not nervous/anxious.        Objective:   Physical Exam  Nursing note and vitals reviewed. Constitutional: He is oriented to person, place, and time. He appears well-developed and well-nourished. No distress.  HENT:  Head: Normocephalic and atraumatic.  Right Ear: External ear normal.  Left Ear: External ear normal.  Nose: Nose normal.  Mouth/Throat: Oropharynx is clear and moist. No oropharyngeal exudate.  Eyes: Conjunctivae and EOM are normal. Pupils are equal, round, and reactive to light. No scleral icterus.  Neck: Normal range of motion. Neck supple. No thyromegaly present.  persistent swelling of R submandibular gland, stable  Cardiovascular: Normal rate, regular rhythm, normal heart sounds and intact distal pulses.   No murmur heard. Pulses:      Radial pulses are 2+ on the right side, and 2+ on the left side.  Pulmonary/Chest: Effort normal and breath sounds normal. No respiratory distress. He has no wheezes. He has no rales.  Abdominal: Soft. Bowel sounds are normal. He exhibits no distension and no mass. There is no tenderness. There is no rebound and no guarding.  Musculoskeletal: Normal range of motion. He exhibits no edema.  Lymphadenopathy:    He has no  cervical adenopathy.  Neurological: He is alert and oriented to person, place, and time.  CN grossly intact, station and gait intact  Skin: Skin is warm and dry. No rash noted.  Psychiatric: He has a normal mood and affect.       Assessment & Plan:

## 2013-02-01 NOTE — Assessment & Plan Note (Signed)
Chronic, stable. Great control of #s on lipitor. Continue.

## 2013-02-01 NOTE — Assessment & Plan Note (Signed)
Chronic, stable. Continue metformin.  rtc 4 mo f/u.

## 2013-02-01 NOTE — Assessment & Plan Note (Signed)
Per ENT eval - stable. Will continue to monitor.

## 2013-02-26 ENCOUNTER — Other Ambulatory Visit: Payer: Self-pay | Admitting: Family Medicine

## 2013-03-23 ENCOUNTER — Encounter: Payer: 59 | Attending: Family Medicine | Admitting: Dietician

## 2013-03-23 ENCOUNTER — Encounter: Payer: Self-pay | Admitting: Dietician

## 2013-03-23 VITALS — Ht 73.25 in | Wt 258.0 lb

## 2013-03-23 DIAGNOSIS — Z713 Dietary counseling and surveillance: Secondary | ICD-10-CM | POA: Insufficient documentation

## 2013-03-23 DIAGNOSIS — I10 Essential (primary) hypertension: Secondary | ICD-10-CM

## 2013-03-23 DIAGNOSIS — E119 Type 2 diabetes mellitus without complications: Secondary | ICD-10-CM | POA: Insufficient documentation

## 2013-03-23 NOTE — Progress Notes (Signed)
Appt start time: 0800 end time:  0900.  Assessment:  Patient was seen on 03/23/13 for individual diabetes education. Pt is more concerned about his BP at this point than his DM. He feels very comfortable controlling his BGs, and his HgbA1c reflects this. Also, his cholesterol profile is very good, with TC/HDL ratio of ~3.   Current HbA1c: 6.8  Preferred Learning Style:   Auditory  Visual  Learning Readiness:   Ready  MEDICATIONS: see list.  DIETARY INTAKE: Usual eating pattern includes 3 meals and 1-3 snacks per day. Everyday foods include fast food regularly.  Avoided foods include sodas.    24-hr recall:  B ( AM): [place] bacon, egg, and cheese biscuit. Or at home [place] bacon, eggs, grits (with some cheese and unsalted butter). Water. Sometimes a diet drink or cranberry or cherry juice. Very rarely OJ.  Snk ( AM): when at home, he might- pack of cracker or piece of fruit. Or yogurt. L ( PM): burger and fries when eating out. Lately he is trying to eat more in the way of meat, two veg with piece of cornbread. Water, diet drinks, or unsweet tea.  Snk ( PM): chips, crackers, fruit. D ( PM): chix, green beans, corn, broccoli, cabbage, sometimes rice, sometimes mac and cheese, avoids bred with dinner. He regularly has foods prepared to tastes of child- hot dogs, manwich, etc.  Snk ( PM): been cutting back on sweets and snacks. He is replacing these with fruits mostly.  Beverages: water, diet drinks, occasionally juice.   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 He wants to begin doing gym routines with a friend of his who also recently took a nutrition class.   Progress Towards Goal(s):  In progress.   Nutritional Diagnosis:  NB-1.1 Food and nutrition-related knowledge deficit As related to nutrition for HTN.  As evidenced by pt statements of same, regular intake of very high sodium foods.    Intervention:  Nutrition  counseling provided.  Discussed diabetes disease process and treatment options.  Discussed physiology of diabetes and role of obesity on insulin resistance.  Encouraged moderate weight reduction to improve glucose levels.  Discussed role of medications and diet in glucose control  Provided education on macronutrients on glucose levels.  Provided education on carb counting, importance of regularly scheduled meals/snacks, and meal planning  Discussed effects of physical activity on glucose levels and long-term glucose control.  Recommended 150 minutes of physical activity/week.  Reviewed patient medications.  Discussed role of medication on blood glucose and possible side effects  Discussed blood glucose monitoring and interpretation.  Discussed recommended target ranges and individual ranges.    Described short-term complications: hyper- and hypo-glycemia.  Discussed causes,symptoms, and treatment options.  Discussed prevention, detection, and treatment of long-term complications.  Discussed the role of prolonged elevated glucose levels on body systems.  Discussed role of stress on blood glucose levels and discussed strategies to manage psychosocial issues.  Discussed recommendations for long-term diabetes self-care.  Established checklist for medical, dental, and emotional self-care.  Pt requested brief refresher on DM type II nutrition ed. After covering much of that, RD emphasized aspects of the DASH diet, with particular emphasis on increases in heart healthful fats (fish, nuts, seeds, avocado, olive oil, etc.), reduction in animal fats (excluding fish), increases in dietary fiber by choosing more whole grain products and legumes as starch options plus nonstarchy vegetables. Additionally, RD highlighted foods in the pt's diet richest in  sodium, particularly fast foods, canned vegetables, and salty snack foods (chips, crackers).   RD encouraged pt to increase non-work physical activity, at  minimum of 150 minutes per week. Home workout provided at pt's request.   Teaching Method Utilized:  Visual Auditory  Handouts given during visit include:  Home Workout  Living Well with Diabetes  Carb Counting Handbook  The Plate Method  Barriers to learning/adherence to lifestyle change: Pt shows some resistance to making breakfast at home, packing lunches due to time concerns.   Diabetes self-care support plan:   [facility] support group  Demonstrated degree of understanding via:  Teach Back   Monitoring/Evaluation:  Dietary intake, exercise, portion control, and body weight. F/U in 3 Months.

## 2013-06-17 ENCOUNTER — Ambulatory Visit: Payer: 59 | Admitting: Dietician

## 2013-06-21 ENCOUNTER — Ambulatory Visit: Payer: 59 | Admitting: Dietician

## 2013-07-29 ENCOUNTER — Ambulatory Visit: Payer: 59 | Admitting: Dietician

## 2013-08-05 ENCOUNTER — Encounter: Payer: Self-pay | Admitting: Dietician

## 2013-08-05 ENCOUNTER — Encounter: Payer: 59 | Attending: Family Medicine | Admitting: Dietician

## 2013-08-05 VITALS — Wt 256.4 lb

## 2013-08-05 DIAGNOSIS — I10 Essential (primary) hypertension: Secondary | ICD-10-CM | POA: Insufficient documentation

## 2013-08-05 DIAGNOSIS — E119 Type 2 diabetes mellitus without complications: Secondary | ICD-10-CM | POA: Insufficient documentation

## 2013-08-05 DIAGNOSIS — Z713 Dietary counseling and surveillance: Secondary | ICD-10-CM | POA: Insufficient documentation

## 2013-08-05 NOTE — Progress Notes (Signed)
A: Pt reports for F/U on HTN and type 2 DM. He reports that his diet has been reasonably improved, but hampered but not having time to cook himself lately and his wife not cooking much.  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 Previously, he had been [facility] as the winter has coming to an end to keep his activity up.  For breakfast, pt has been having 2 eggs over easy with [place] bacon, and grits. For lunch, usually eats out at cafeteria to get some vegetables in. For dinner, he states he has been getting home late, and his wife has not been cooking much, so he has been eating out more than he would like. He has been snacking throughout the day on many bananas, 2-5 per day, and some reduced fat cheez-its.   He claims he also enjoys many nuts and seeds as a snack option, but simply hasn't thought to incorporate them much.  I: To fit pt's schedule, RD recommended incorporating some pre-made, calorie controlled meals like Healthy Choice, SmartOnes, and WellPointLean Cuisine. RD also recommended utilizing nuts and seeds as a snack option during the day, while reducing banana intake to no more than 2 per day. Because pt is not regularly eating fish, RD recommended a few potential options for fish oil, at a dose of 3000 mg per day (3 softgels).   M/E: Monitor diet, exercise, supplement and meds, labs, portion control. F/U PRN.

## 2013-10-18 ENCOUNTER — Ambulatory Visit (INDEPENDENT_AMBULATORY_CARE_PROVIDER_SITE_OTHER): Payer: 59 | Admitting: Family Medicine

## 2013-10-18 ENCOUNTER — Telehealth: Payer: Self-pay | Admitting: Family Medicine

## 2013-10-18 ENCOUNTER — Encounter: Payer: Self-pay | Admitting: Family Medicine

## 2013-10-18 ENCOUNTER — Encounter: Payer: Self-pay | Admitting: *Deleted

## 2013-10-18 ENCOUNTER — Encounter (INDEPENDENT_AMBULATORY_CARE_PROVIDER_SITE_OTHER): Payer: Self-pay

## 2013-10-18 VITALS — BP 130/90 | HR 68 | Temp 98.4°F | Wt 250.8 lb

## 2013-10-18 DIAGNOSIS — I10 Essential (primary) hypertension: Secondary | ICD-10-CM

## 2013-10-18 DIAGNOSIS — E119 Type 2 diabetes mellitus without complications: Secondary | ICD-10-CM

## 2013-10-18 DIAGNOSIS — M79609 Pain in unspecified limb: Secondary | ICD-10-CM | POA: Insufficient documentation

## 2013-10-18 DIAGNOSIS — E7800 Pure hypercholesterolemia, unspecified: Secondary | ICD-10-CM

## 2013-10-18 DIAGNOSIS — M79606 Pain in leg, unspecified: Secondary | ICD-10-CM

## 2013-10-18 LAB — HEMOGLOBIN A1C: Hgb A1c MFr Bld: 7 % — ABNORMAL HIGH (ref 4.6–6.5)

## 2013-10-18 LAB — BASIC METABOLIC PANEL
BUN: 27 mg/dL — AB (ref 6–23)
CALCIUM: 10.4 mg/dL (ref 8.4–10.5)
CO2: 28 mEq/L (ref 19–32)
CREATININE: 1.1 mg/dL (ref 0.4–1.5)
Chloride: 105 mEq/L (ref 96–112)
GFR: 90.89 mL/min (ref >60.00)
Glucose, Bld: 120 mg/dL — ABNORMAL HIGH (ref 70–99)
Potassium: 4.3 mEq/L (ref 3.5–5.1)
Sodium: 139 mEq/L (ref 135–145)

## 2013-10-18 LAB — CK: CK TOTAL: 166 U/L (ref 7–232)

## 2013-10-18 MED ORDER — ATORVASTATIN CALCIUM 40 MG PO TABS: ORAL_TABLET | ORAL | Status: DC

## 2013-10-18 MED ORDER — LISINOPRIL-HYDROCHLOROTHIAZIDE 20-25 MG PO TABS: ORAL_TABLET | ORAL | Status: DC

## 2013-10-18 MED ORDER — METFORMIN HCL 500 MG PO TABS: ORAL_TABLET | ORAL | Status: DC

## 2013-10-18 NOTE — Assessment & Plan Note (Signed)
Chronic, stable. Continue lipitor.  

## 2013-10-18 NOTE — Assessment & Plan Note (Signed)
Chronic, stable. Continue lisinopril hctz.  

## 2013-10-18 NOTE — Progress Notes (Signed)
BP 130/90  Pulse 68  Temp(Src) 98.4 F (36.9 C) (Oral)  Wt 250 lb 12 oz (113.739 kg)   CC: med refill visit  Subjective:    Patient ID: Joseph Galloway, male    DOB: Oct 11, 1965, 48 y.o.   MRN: [id number]  HPI: Joseph Galloway is a 48 y.o. male presenting on 10/18/2013 for Medication Refill   DM - regularly does check sugars 120 fasting.  Compliant with antihyperglycemic regimen which includes: metformin 1052m in am and 5052min pm.  Denies low sugars or hypoglycemic symptoms.  Denies paresthesias. Last diabetic eye exam 10/2012.  Pneumovax: 12/06.  Prevnar: not done.   HTN - Compliant with current antihypertensive regimen of lisinopril hctz once daily.  Does not check blood pressures at home.  No low blood pressure symptoms of dizziness/syncope.  Denies HA, vision changes, CP/tightness, SOB, leg swelling.    HLD - compliant with lipitor.  Some trouble noted with legs- endorses prominent veins mainly in R leg. Continues staying active with job.  Wt Readings from Last 3 Encounters:  10/18/13 250 lb 12 oz (113.739 kg)  08/05/13 256 lb 6.4 oz (116.302 kg)  03/23/13 258 lb (117.028 kg)  .Body mass index is 32.84 kg/(m^2).  Relevant past medical, surgical, family and social history reviewed and updated as indicated.  Allergies and medications reviewed and updated. Current Outpatient Prescriptions on File Prior to Visit  Medication Sig  . Blood Glucose Monitoring Suppl (ONE TOUCH ULTRA SYSTEM KIT) W/DEVICE KIT 1 kit by Does not apply route once.  . Blood Pressure Monitoring (BLOOD PRESSURE MONITOR/L CUFF) MISC Use to monitor blood pressure as directed.  . loratadine (CLARITIN) 10 MG tablet Take 10 mg by mouth daily.  . Glory RosebushELICA LANCETS 3373XISC 1 each by Does not apply route daily. 250.00  . Ascorbic Acid (VITAMIN C) 1000 MG tablet Take 1,000 mg by mouth daily.  . cetirizine (ZYRTEC) 10 MG tablet Take 10 mg by mouth daily.  . fluticasone (FLONASE) 50 MCG/ACT nasal spray Place 2  sprays into the nose daily.  . Multiple Vitamin (MULTIVITAMIN) tablet Take 1 tablet by mouth daily.   No current facility-administered medications on file prior to visit.    Review of Systems Per HPI unless specifically indicated above    Objective:    BP 130/90  Pulse 68  Temp(Src) 98.4 F (36.9 C) (Oral)  Wt 250 lb 12 oz (113.739 kg)  Physical Exam  Nursing note and vitals reviewed. Constitutional: He appears well-developed and well-nourished. No distress.  HENT:  Mouth/Throat: Oropharynx is clear and moist. No oropharyngeal exudate.  Cardiovascular: Normal rate, regular rhythm, normal heart sounds and intact distal pulses.   No murmur heard. Pulmonary/Chest: Effort normal and breath sounds normal. No respiratory distress. He has no wheezes. He has no rales.  Musculoskeletal: He exhibits no edema.  Diabetic foot exam: Normal inspection No skin breakdown No calluses  Normal DP pulses Normal sensation to light touch and monofilament Bilateral great toe nails onychomycotic R lat leg with varicose veins   Results for orders placed in visit on 02/01/13  [facility] DIABETES EYE EXAM      Result Value Ref Range   HM Diabetic Eye Exam per pt normal        Assessment & Plan:   Problem List Items Addressed This Visit   Pain in limb     Check CPK today. Discussed weight loss and elevation of legs. Declines compression for now.    Relevant  Orders      CK   HYPERTENSION, BENIGN SYSTEMIC     Chronic, stable. Continue lisinopril hctz.    Relevant Medications      atorvastatin (LIPITOR) tablet      lisinopril-hydrochlorothiazide (PRINZIDE,ZESTORETIC) 20-25 MG per tablet   HYPERCHOLESTEROLEMIA     Chronic, stable . Continue lipitor.    Relevant Medications      atorvastatin (LIPITOR) tablet      lisinopril-hydrochlorothiazide (PRINZIDE,ZESTORETIC) 20-25 MG per tablet   Diabetes type 2, controlled - Primary      Chronic, stable. Continue regimen. Check A1c today. Lab Results    Component Value Date   HGBA1C 6.8* 01/25/2013      Relevant Medications      atorvastatin (LIPITOR) tablet      lisinopril-hydrochlorothiazide (PRINZIDE,ZESTORETIC) 20-25 MG per tablet      metFORMIN (GLUCOPHAGE) tablet   Other Relevant Orders      [facility] DIABETES FOOT EXAM (Completed)      Hemoglobin A1c      Basic metabolic panel       Follow up plan: Return as needed, for previously scheduled appointment for physical.

## 2013-10-18 NOTE — Assessment & Plan Note (Signed)
Check CPK today. Discussed weight loss and elevation of legs. Declines compression for now.

## 2013-10-18 NOTE — Telephone Encounter (Signed)
Relevant patient education assigned to patient using Emmi. ° °

## 2013-10-18 NOTE — Progress Notes (Signed)
Pre visit review using our clinic review tool, if applicable. No additional management support is needed unless otherwise documented below in the visit note. 

## 2013-10-18 NOTE — Assessment & Plan Note (Signed)
Chronic, stable. Continue regimen. Check A1c today. Lab Results  Component Value Date   HGBA1C 6.8* 01/25/2013

## 2013-10-18 NOTE — Patient Instructions (Signed)
Blood work today. Good to see you today, call us with questions. Good job with weight loss! Keep it up. Elevate legs.

## 2013-11-09 ENCOUNTER — Other Ambulatory Visit: Payer: Self-pay | Admitting: *Deleted

## 2013-11-09 MED ORDER — METFORMIN HCL 500 MG PO TABS: ORAL_TABLET | ORAL | Status: DC

## 2013-12-21 ENCOUNTER — Telehealth: Payer: Self-pay | Admitting: *Deleted

## 2013-12-21 NOTE — Telephone Encounter (Signed)
Lm on pts vm requesting a call back to schedule BP check for DM bundle 

## 2014-01-12 ENCOUNTER — Encounter: Payer: Self-pay | Admitting: Family Medicine

## 2014-01-12 ENCOUNTER — Ambulatory Visit (INDEPENDENT_AMBULATORY_CARE_PROVIDER_SITE_OTHER): Payer: 59 | Admitting: Family Medicine

## 2014-01-12 VITALS — BP 124/82 | HR 68 | Temp 99.2°F | Wt 246.8 lb

## 2014-01-12 DIAGNOSIS — M25511 Pain in right shoulder: Secondary | ICD-10-CM | POA: Insufficient documentation

## 2014-01-12 DIAGNOSIS — J019 Acute sinusitis, unspecified: Secondary | ICD-10-CM | POA: Insufficient documentation

## 2014-01-12 MED ORDER — NAPROXEN 500 MG PO TABS: ORAL_TABLET | ORAL | Status: DC

## 2014-01-12 MED ORDER — AMOXICILLIN-POT CLAVULANATE 875-125 MG PO TABS: 1.0000 | ORAL_TABLET | Freq: Two times a day (BID) | ORAL | Status: AC

## 2014-01-12 NOTE — Assessment & Plan Note (Signed)
In h/o allergic rhinitis. rec restart flonase. Given duration and progression of mucous, will cover for sinusitis with augmentin course - rec take with food.

## 2014-01-12 NOTE — Progress Notes (Signed)
BP 124/82  Pulse 68  Temp(Src) 99.2 F (37.3 C) (Oral)  Wt 246 lb 12 oz (111.925 kg)   CC: chest wall pain, sinusitis?  Subjective:    Patient ID: Joseph Galloway, male    DOB: [birth year], 48 y.o.   MRN: [id number]  HPI: Joseph Galloway is a 48 y.o. male presenting on 01/12/2014 for Pulled muscle and Sinusitis   R shoulder and anterior chest wall pain - started after working 1 mo ago, pulling mower onto truck. It slipped and he caught it and since then starting to have progressively worsening R shoulder/chest wall pain. Trouble sleeping at night time. No dyspnea or chest pain otherwise. Started posterior shoulder, radiates to anterior chest. Hasn't tried anything for this other than heating pad which didn't help. + positional.   1 mo h/o nasal congestion, PNdrainage. Cough has resolved. Productive of mucous. Does smell mucous.  No fevers/chills, headache, ear or tooth pain.  + sick contacts at home. No smokers at home. No h/o asthma. + h/o allergies. Continues saline drops. Not currently taking flonase. Currently on claritin.  Wt Readings from Last 3 Encounters:  01/12/14 246 lb 12 oz (111.925 kg)  10/18/13 250 lb 12 oz (113.739 kg)  08/05/13 256 lb 6.4 oz (116.302 kg)  Body mass index is 32.32 kg/(m^2).  Relevant past medical, surgical, family and social history reviewed and updated as indicated.  Allergies and medications reviewed and updated. Current Outpatient Prescriptions on File Prior to Visit  Medication Sig  . Ascorbic Acid (VITAMIN C) 1000 MG tablet Take 1,000 mg by mouth daily.  Marland Kitchen atorvastatin (LIPITOR) 40 MG tablet TAKE ONE TABLET BY MOUTH ONCE DAILY  . Blood Glucose Monitoring Suppl (ONE TOUCH ULTRA SYSTEM KIT) W/DEVICE KIT 1 kit by Does not apply route once.  . Blood Pressure Monitoring (BLOOD PRESSURE MONITOR/L CUFF) MISC Use to monitor blood pressure as directed.  . cetirizine (ZYRTEC) 10 MG tablet Take 10 mg by mouth daily.  . fluticasone (FLONASE) 50 MCG/ACT  nasal spray Place 2 sprays into the nose daily.  Marland Kitchen lisinopril-hydrochlorothiazide (PRINZIDE,ZESTORETIC) 20-25 MG per tablet TAKE ONE TABLET BY MOUTH EVERY DAY  . loratadine (CLARITIN) 10 MG tablet Take 10 mg by mouth daily.  . metFORMIN (GLUCOPHAGE) 500 MG tablet Take two pills in the morning and one pill at night  . Multiple Vitamin (MULTIVITAMIN) tablet Take 1 tablet by mouth daily.  Glory Rosebush DELICA LANCETS 00F MISC 1 each by Does not apply route daily. 250.00   No current facility-administered medications on file prior to visit.    Review of Systems Per HPI unless specifically indicated above    Objective:    BP 124/82  Pulse 68  Temp(Src) 99.2 F (37.3 C) (Oral)  Wt 246 lb 12 oz (111.925 kg)  Physical Exam  Nursing note and vitals reviewed. Constitutional: He appears well-developed and well-nourished. No distress.  HENT:  Head: Normocephalic and atraumatic.  Right Ear: Hearing, tympanic membrane, external ear and ear canal normal.  Left Ear: Hearing, tympanic membrane, external ear and ear canal normal.  Nose: Mucosal edema present. No rhinorrhea. Right sinus exhibits no maxillary sinus tenderness and no frontal sinus tenderness. Left sinus exhibits no maxillary sinus tenderness and no frontal sinus tenderness.  Mouth/Throat: Uvula is midline, oropharynx is clear and moist and mucous membranes are normal. No oropharyngeal exudate, posterior oropharyngeal edema, posterior oropharyngeal erythema or tonsillar abscesses.  OP cobblestoning  Eyes: Conjunctivae and EOM are normal. Pupils are equal, round, and  reactive to light. No scleral icterus.  Neck: Normal range of motion. Neck supple.  Cardiovascular: Normal rate, regular rhythm, normal heart sounds and intact distal pulses.   No murmur heard. Pulmonary/Chest: Effort normal and breath sounds normal. No respiratory distress. He has no wheezes. He has no rales.  Musculoskeletal:  L shoulder WNL R Shoulder exam: No deformity  of shoulders on inspection. +pain with palpation of posterior shoulder FROM in abduction and forward flexion but with pain. + pain/weakness with testing SITS in ext rotation. + pain with empty can sign. + impingement.  Lymphadenopathy:    He has no cervical adenopathy.  Skin: Skin is warm and dry. No rash noted.   Results for orders placed in visit on 10/18/13  HEMOGLOBIN A1C      Result Value Ref Range   Hemoglobin A1C 7.0 (*) 4.6 - 6.5 %  BASIC METABOLIC PANEL      Result Value Ref Range   Sodium 139  135 - 145 mEq/L   Potassium 4.3  3.5 - 5.1 mEq/L   Chloride 105  96 - 112 mEq/L   CO2 28  19 - 32 mEq/L   Glucose, Bld 120 (*) 70 - 99 mg/dL   BUN 27 (*) 6 - 23 mg/dL   Creatinine, Ser 1.1  0.4 - 1.5 mg/dL   Calcium 10.4  8.4 - 10.5 mg/dL   GFR 90.89  >60.00 mL/min  CK      Result Value Ref Range   Total CK 166  7 - 232 U/L      Assessment & Plan:   Problem List Items Addressed This Visit   Right shoulder pain     Exam consistent with RTC injury , anticipate tendonitis and not tear given preserved ROM. Treat with naprosyn 525m bid, [facility] Update if not improved for ortho referral. Pt agrees with plan.    Acute sinusitis - Primary     In h/o allergic rhinitis. rec restart flonase. Given duration and progression of mucous, will cover for sinusitis with augmentin course - rec take with food.        Follow up plan: Return if symptoms worsen or fail to improve.

## 2014-01-12 NOTE — Assessment & Plan Note (Signed)
Exam consistent with RTC injury , anticipate tendonitis and not tear given preserved ROM. Treat with naprosyn 500mg  bid, [facility] Update if not improved for ortho referral. Pt agrees with plan.

## 2014-01-12 NOTE — Patient Instructions (Signed)
For sinuses - restart flonase. Antibiotic for 10 days (augmentin). Take with food, and start yogurt. For right shoulder - I think you have RTC injury - treat with naprosyn twice daily with food for 1 week then as needed , do rehab exercises provided today If not better with this, let us know for ortho referral. Good to see you today, call us with quesitons.

## 2014-01-12 NOTE — Progress Notes (Signed)
Pre visit review using our clinic review tool, if applicable. No additional management support is needed unless otherwise documented below in the visit note. 

## 2014-01-17 ENCOUNTER — Ambulatory Visit (INDEPENDENT_AMBULATORY_CARE_PROVIDER_SITE_OTHER): Payer: 59

## 2014-01-17 DIAGNOSIS — Z23 Encounter for immunization: Secondary | ICD-10-CM

## 2014-08-09 ENCOUNTER — Other Ambulatory Visit: Payer: Self-pay | Admitting: Family Medicine

## 2014-08-15 ENCOUNTER — Ambulatory Visit (INDEPENDENT_AMBULATORY_CARE_PROVIDER_SITE_OTHER): Payer: 59 | Admitting: Family Medicine

## 2014-08-15 ENCOUNTER — Encounter: Payer: Self-pay | Admitting: Family Medicine

## 2014-08-15 VITALS — BP 114/70 | HR 72 | Temp 98.5°F | Wt 245.2 lb

## 2014-08-15 DIAGNOSIS — S39012A Strain of muscle, fascia and tendon of lower back, initial encounter: Secondary | ICD-10-CM | POA: Insufficient documentation

## 2014-08-15 MED ORDER — METHOCARBAMOL 500 MG PO TABS: 500.0000 mg | ORAL_TABLET | Freq: Three times a day (TID) | ORAL | Status: DC | PRN

## 2014-08-15 MED ORDER — NAPROXEN 500 MG PO TABS: ORAL_TABLET | ORAL | Status: DC

## 2014-08-15 NOTE — Progress Notes (Signed)
Pre visit review using our clinic review tool, if applicable. No additional management support is needed unless otherwise documented below in the visit note. 

## 2014-08-15 NOTE — Assessment & Plan Note (Addendum)
Lumbar strain with spasm R>L - treat with robaxin, naprosyn, and lower back exercises provided today. Update if not improving as expected.

## 2014-08-15 NOTE — Patient Instructions (Addendum)
For back spasm and lumbar strain - treat with naprosyn anti inflammatory course + robaxin (methocarbamol) muscle relaxant (may make you sleepy). When feeling better start doing lower back strengthening. Stretching exercises provided today.

## 2014-08-15 NOTE — Progress Notes (Signed)
   BP 114/70 mmHg  Pulse 72  Temp(Src) 98.5 F (36.9 C) (Oral)  Wt 245 lb 4 oz (111.245 kg)   CC: back spasms Subjective:    Patient ID: Joseph Galloway, male    DOB: Aug 09, 1965, 49 y.o.   MRN: [id number]  HPI: Joseph Galloway is a 49 y.o. male presenting on 08/15/2014 for Back Pain   6 d h/o lower back spasm. No radiation. Did start after moving riding mower on Wednesday, next day helped put in water filter, under sink for several hours.   No numbness or weakness of legs, saddle anesthesia, bowel/bladder incontinence, fevers.  Self treated with tylenol 1031m which helped.  Went to work today - some trouble.   Relevant past medical, surgical, family and social history reviewed and updated as indicated. Interim medical history since our last visit reviewed. Allergies and medications reviewed and updated. Current Outpatient Prescriptions on File Prior to Visit  Medication Sig  . Ascorbic Acid (VITAMIN C) 1000 MG tablet Take 1,000 mg by mouth daily.  .Marland Kitchenatorvastatin (LIPITOR) 40 MG tablet TAKE ONE TABLET BY MOUTH ONCE DAILY  . Blood Glucose Monitoring Suppl (ONE TOUCH ULTRA SYSTEM KIT) W/DEVICE KIT 1 kit by Does not apply route once.  . Blood Pressure Monitoring (BLOOD PRESSURE MONITOR/L CUFF) MISC Use to monitor blood pressure as directed.  . cetirizine (ZYRTEC) 10 MG tablet Take 10 mg by mouth daily.  . fluticasone (FLONASE) 50 MCG/ACT nasal spray Place 2 sprays into the nose daily.  .Marland Kitchenlisinopril-hydrochlorothiazide (PRINZIDE,ZESTORETIC) 20-25 MG per tablet TAKE ONE TABLET BY MOUTH ONCE DAILY  . metFORMIN (GLUCOPHAGE) 500 MG tablet Take two pills in the morning and one pill at night  . Multiple Vitamin (MULTIVITAMIN) tablet Take 1 tablet by mouth daily.  .Glory RosebushDELICA LANCETS 389FMISC 1 each by Does not apply route daily. 250.00  . loratadine (CLARITIN) 10 MG tablet Take 10 mg by mouth daily.   No current facility-administered medications on file prior to visit.    Review of  Systems Per HPI unless specifically indicated above     Objective:    BP 114/70 mmHg  Pulse 72  Temp(Src) 98.5 F (36.9 C) (Oral)  Wt 245 lb 4 oz (111.245 kg)  Wt Readings from Last 3 Encounters:  08/15/14 245 lb 4 oz (111.245 kg)  01/12/14 246 lb 12 oz (111.925 kg)  10/18/13 250 lb 12 oz (113.739 kg)    Physical Exam  Constitutional: He is oriented to person, place, and time. He appears well-developed and well-nourished. No distress.  Musculoskeletal: He exhibits no edema.  + tender midline lower lumbar spine + paraspinous mm tenderness R>L Neg SLR bilaterally. Neg FABER. No pain at SIJ, GTB or sciatic notch bilaterally.  Neurological: He is alert and oriented to person, place, and time.  5/5 strength BLE  Nursing note and vitals reviewed.      Assessment & Plan:  Pt overdue for CPE - will return in 3 mo for this. Declined labwork today. Problem List Items Addressed This Visit    Lumbar strain - Primary    Lumbar strain with spasm R>L - treat with robaxin, naprosyn, and lower back exercises provided today. Update if not improving as expected.          Follow up plan: Return in about 3 months (around 11/15/2014), or if symptoms worsen or fail to improve, for annual exam, prior fasting for blood work.

## 2014-11-02 ENCOUNTER — Ambulatory Visit (INDEPENDENT_AMBULATORY_CARE_PROVIDER_SITE_OTHER)
Admission: RE | Admit: 2014-11-02 | Discharge: 2014-11-02 | Disposition: A | Discharge status: Home / Self Care | Payer: 59 | Source: Ambulatory Visit | Attending: Family Medicine | Admitting: Family Medicine

## 2014-11-02 ENCOUNTER — Ambulatory Visit (INDEPENDENT_AMBULATORY_CARE_PROVIDER_SITE_OTHER): Payer: 59 | Admitting: Family Medicine

## 2014-11-02 ENCOUNTER — Encounter: Payer: Self-pay | Admitting: Family Medicine

## 2014-11-02 VITALS — BP 126/78 | HR 76 | Temp 99.0°F | Ht 73.25 in | Wt 242.5 lb

## 2014-11-02 DIAGNOSIS — M5417 Radiculopathy, lumbosacral region: Secondary | ICD-10-CM

## 2014-11-02 DIAGNOSIS — M5416 Radiculopathy, lumbar region: Secondary | ICD-10-CM

## 2014-11-02 NOTE — Progress Notes (Signed)
Pre visit review using our clinic review tool, if applicable. No additional management support is needed unless otherwise documented below in the visit note. 

## 2014-11-02 NOTE — Patient Instructions (Signed)
I think your leg pain may be related to a back problem (most likely a spasm causing a pinched nerve)  Use caution with heavy lifting  Walking is good for back pain  Xray now  Use heat on your low back /buttock (painful area)  Continue naproxen as needed   Update if not starting to improve in a week or if worsening

## 2014-11-02 NOTE — Progress Notes (Signed)
Subjective:    Patient ID: Joseph Galloway, male    DOB: [birth year], 49 y.o.   MRN: [id number]  HPI Here for a week of L leg pain   Started in hip/lateral leg and now going down to ankle  Stiff in hip/buttock area  Dull ache   No tingling or numbness   Did have pain in hip/lateral leg when he lay on that side in bed   Some L low back /buttock pain (not in the middle)   Taking naproxen (had it from Dr Darnell Level) - it did not help last night   Patient Active Problem List   Diagnosis Date Noted  . Lumbar strain 08/15/2014  . Right shoulder pain 01/12/2014  . Pain in limb 10/18/2013  . Lymphadenopathy of right cervical region 10/26/2012  . GERD (gastroesophageal reflux disease) 10/26/2012  . Chest discomfort 07/29/2012  . Allergic rhinitis   . Healthcare maintenance 07/23/2011  . ONYCHOMYCOSIS, TOENAILS 04/13/2008  . Diabetes type 2, controlled 06/05/2006  . HYPERCHOLESTEROLEMIA 06/05/2006  . OBESITY, NOS 06/05/2006  . HYPERTENSION, BENIGN SYSTEMIC 06/05/2006   Past Medical History  Diagnosis Date  . Allergic rhinitis     Used to receive weekly injections  . Onychomycosis   . HTN (hypertension)   . HLD (hyperlipidemia)   . Diabetes type 2, controlled   . 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 History  Substance Use Topics  . Smoking status: Former Smoker    Types: Cigars  . Smokeless tobacco: Never Used     Comment: Very occasional cigar/had not smoked one in 10 years  . Alcohol Use: No   Family History  Problem Relation Age of Onset  . Asthma Son   . Healthy Mother   . Diabetes Maternal Aunt   . Diabetes Sister   . Hypertension      family history  . Cancer Father 24    prostate  . Cancer Paternal Uncle     prostate  . Stroke Neg Hx   . CAD Neg Hx    No Known Allergies Current Outpatient Prescriptions on File Prior to Visit  Medication Sig Dispense  Refill  . Ascorbic Acid (VITAMIN C) 1000 MG tablet Take 1,000 mg by mouth daily.    Marland Kitchen atorvastatin (LIPITOR) 40 MG tablet TAKE ONE TABLET BY MOUTH ONCE DAILY 90 tablet 3  . Blood Glucose Monitoring Suppl (ONE TOUCH ULTRA SYSTEM KIT) W/DEVICE KIT 1 kit by Does not apply route once. 1 each 11  . Blood Pressure Monitoring (BLOOD PRESSURE MONITOR/L CUFF) MISC Use to monitor blood pressure as directed. 1 each 0  . cetirizine (ZYRTEC) 10 MG tablet Take 10 mg by mouth daily.    . fluticasone (FLONASE) 50 MCG/ACT nasal spray Place 2 sprays into the nose daily. 16 g 3  . lisinopril-hydrochlorothiazide (PRINZIDE,ZESTORETIC) 20-25 MG per tablet TAKE ONE TABLET BY MOUTH ONCE DAILY 90 tablet 0  . loratadine (CLARITIN) 10 MG tablet Take 10 mg by mouth daily.    . metFORMIN (GLUCOPHAGE) 500 MG tablet Take two pills in the morning and one pill at night 270 tablet 3  . methocarbamol (ROBAXIN) 500 MG tablet Take 1-2 tablets (500-1,000 mg total) by mouth every 8 (eight) hours as needed for muscle spasms (sedation precautions). 40 tablet 0  . Multiple Vitamin (MULTIVITAMIN) tablet Take 1 tablet by mouth daily.    Marland Kitchen  naproxen (NAPROSYN) 500 MG tablet Take one po bid x 1 week then prn pain, take with food 60 tablet 0  . ONETOUCH DELICA LANCETS 59M MISC 1 each by Does not apply route daily. 250.00 100 each 3   No current facility-administered medications on file prior to visit.    Review of Systems Review of Systems  Constitutional: Negative for fever, appetite change, fatigue and unexpected weight change.  Eyes: Negative for pain and visual disturbance.  Respiratory: Negative for cough and shortness of breath.   Cardiovascular: Negative for cp or palpitations    Gastrointestinal: Negative for nausea, diarrhea and constipation.  Genitourinary: Negative for urgency and frequency.  Skin: Negative for pallor or rash   MSK pos for lateral L leg and buttock pain , neg for joint swelling  Neurological: Negative for  weakness, light-headedness, numbness and headaches.  Hematological: Negative for adenopathy. Does not bruise/bleed easily.  Psychiatric/Behavioral: Negative for dysphoric mood. The patient is not nervous/anxious.         Objective:   Physical Exam  Constitutional: He appears well-developed and well-nourished. No distress.  HENT:  Head: Normocephalic and atraumatic.  Neck: Normal range of motion. Neck supple.  Cardiovascular: Normal rate and regular rhythm.   Pulmonary/Chest: Effort normal and breath sounds normal.  Musculoskeletal: He exhibits tenderness. He exhibits no edema.  LS -no bony tenderness L lumbar muscular tenderness including piriformis tenderness  Nl rom hip  SLR pos for L buttock pain  LS flex- full, ext 10 deg with pain, nl lateral movement Nl gait w/o foot drop    Lymphadenopathy:    He has no cervical adenopathy.  Neurological: He is alert. He has normal reflexes. He displays no atrophy and no tremor. No cranial nerve deficit or sensory deficit. He exhibits normal muscle tone. Coordination normal.  3/5 L great toe dorsiflexion , otherwise nl neuro exam  Skin: Skin is warm and dry. No rash noted. No erythema. No pallor.  Psychiatric: He has a normal mood and affect.          Assessment & Plan:   Problem List Items Addressed This Visit    Lumbar back pain with radiculopathy affecting left lower extremity - Primary    XRay today of LS  Heat/stretches/walking Continue naproxen  Nl exam except some weakness of L great toe dorsiflexion - pt unsure if this is new  Plan from here       Relevant Orders   DG Lumbar Spine Complete (Completed)

## 2014-11-03 ENCOUNTER — Other Ambulatory Visit: Payer: Self-pay | Admitting: Family Medicine

## 2014-11-03 NOTE — Assessment & Plan Note (Signed)
XRay today of LS  Heat/stretches/walking Continue naproxen  Nl exam except some weakness of L great toe dorsiflexion - pt unsure if this is new  Plan from here

## 2014-11-04 ENCOUNTER — Telehealth: Payer: Self-pay | Admitting: Family Medicine

## 2014-11-04 MED ORDER — PREDNISONE 10 MG PO TABS: 10.0000 mg | ORAL_TABLET | Freq: Every day | ORAL | Status: DC

## 2014-11-04 NOTE — Telephone Encounter (Signed)
Addressed through result notes  

## 2014-11-04 NOTE — Telephone Encounter (Signed)
Spoke with pt's wife and advise her of Dr. Royden Purl instructions. Rx sent to pharmacy and pt will call back next week to schedule f/u

## 2014-11-04 NOTE — Telephone Encounter (Signed)
Let's try a prednisone taper  Stop naproxen while on this  It may make him hungry and hyper - warn please , it will also cause temporary increase in blood sugar - so he is aware  This should help inflammation and pain  F/u with Dr Reece Agar next week for a re check

## 2014-11-04 NOTE — Telephone Encounter (Signed)
Pt returned your call. Please call back at [phone]

## 2014-11-04 NOTE — Telephone Encounter (Signed)
-----   Message from Shon Millet, [place] sent at 11/04/2014  2:07 PM EDT ----- Pt notified of xray results and Dr. Royden Purl comments. Pt said his leg pain is worsening and it was so bad last night he couldn't sleep, pt wants to know what should he do, please advise

## 2014-11-15 ENCOUNTER — Other Ambulatory Visit: Payer: Self-pay | Admitting: Family Medicine

## 2014-11-15 DIAGNOSIS — E119 Type 2 diabetes mellitus without complications: Secondary | ICD-10-CM

## 2014-11-15 DIAGNOSIS — E7800 Pure hypercholesterolemia, unspecified: Secondary | ICD-10-CM

## 2014-11-15 DIAGNOSIS — I10 Essential (primary) hypertension: Secondary | ICD-10-CM

## 2014-11-16 ENCOUNTER — Other Ambulatory Visit (INDEPENDENT_AMBULATORY_CARE_PROVIDER_SITE_OTHER): Payer: 59

## 2014-11-16 ENCOUNTER — Other Ambulatory Visit: Payer: Self-pay

## 2014-11-16 DIAGNOSIS — E119 Type 2 diabetes mellitus without complications: Secondary | ICD-10-CM

## 2014-11-16 DIAGNOSIS — E7800 Pure hypercholesterolemia, unspecified: Secondary | ICD-10-CM

## 2014-11-16 LAB — BASIC METABOLIC PANEL
BUN: 27 mg/dL — ABNORMAL HIGH (ref 6–23)
CHLORIDE: 102 meq/L (ref 96–112)
CO2: 31 mEq/L (ref 19–32)
Calcium: 10.4 mg/dL (ref 8.4–10.5)
Creatinine, Ser: 1.18 mg/dL (ref 0.40–1.50)
GFR: 85.18 mL/min (ref >60.00)
Glucose, Bld: 123 mg/dL — ABNORMAL HIGH (ref 70–99)
POTASSIUM: 4.2 meq/L (ref 3.5–5.1)
SODIUM: 138 meq/L (ref 135–145)

## 2014-11-16 LAB — LIPID PANEL
CHOL/HDL RATIO: 4
Cholesterol: 123 mg/dL (ref 0–200)
HDL: 34.6 mg/dL — AB (ref >39.00)
LDL Cholesterol: 60 mg/dL (ref 0–99)
NonHDL: 88.49
Triglycerides: 142 mg/dL (ref 0.0–149.0)
VLDL: 28.4 mg/dL (ref 0.0–40.0)

## 2014-11-16 LAB — MICROALBUMIN / CREATININE URINE RATIO
CREATININE, U: 143.7 mg/dL
MICROALB/CREAT RATIO: 0.5 mg/g (ref 0.0–30.0)

## 2014-11-16 LAB — HEMOGLOBIN A1C: Hgb A1c MFr Bld: 6.4 % (ref 4.6–6.5)

## 2014-11-16 MED ORDER — METFORMIN HCL 500 MG PO TABS: ORAL_TABLET | ORAL | Status: DC

## 2014-11-16 NOTE — Telephone Encounter (Signed)
Pt left note requesting refill metformin to [facility] [place]. Pt had labs today and [facility] scheduled on 11/22/14. Left detailed message per DPR that one refill was done and can get further refill update at CPX on 11/22/14.

## 2014-11-22 ENCOUNTER — Ambulatory Visit (INDEPENDENT_AMBULATORY_CARE_PROVIDER_SITE_OTHER): Payer: 59 | Admitting: Family Medicine

## 2014-11-22 ENCOUNTER — Encounter: Payer: Self-pay | Admitting: Family Medicine

## 2014-11-22 VITALS — BP 112/80 | HR 62 | Temp 98.5°F | Ht 73.25 in | Wt 239.5 lb

## 2014-11-22 DIAGNOSIS — Z0000 Encounter for general adult medical examination without abnormal findings: Secondary | ICD-10-CM

## 2014-11-22 DIAGNOSIS — E669 Obesity, unspecified: Secondary | ICD-10-CM

## 2014-11-22 DIAGNOSIS — E7800 Pure hypercholesterolemia, unspecified: Secondary | ICD-10-CM

## 2014-11-22 DIAGNOSIS — I10 Essential (primary) hypertension: Secondary | ICD-10-CM

## 2014-11-22 DIAGNOSIS — E119 Type 2 diabetes mellitus without complications: Secondary | ICD-10-CM

## 2014-11-22 NOTE — Assessment & Plan Note (Signed)
Chronic, stable. Continue metformin. RTC 6 mo DM check.

## 2014-11-22 NOTE — Assessment & Plan Note (Signed)
Body mass index is 31.37 kg/(m^2).  Discussed healthy diet and lifestyle changes to affect sustainable weight loss

## 2014-11-22 NOTE — Patient Instructions (Addendum)
You are doing well today. Decrease added sugars, eliminate trans fats, increase fiber and limit alcohol.  All these changes together can drop triglycerides by almost 50%.  Return as needed or in 6 months for follow up diabetes.  Health Maintenance A healthy lifestyle and preventative care can promote health and wellness.  Maintain regular health, dental, and eye exams.  Eat a healthy diet. Foods like vegetables, fruits, whole grains, low-fat dairy products, and lean protein foods contain the nutrients you need and are low in calories. Decrease your intake of foods high in solid fats, added sugars, and salt. Get information about a proper diet from your health care provider, if necessary.  Regular physical exercise is one of the most important things you can do for your health. Most adults should get at least 150 minutes of moderate-intensity exercise (any activity that increases your heart rate and causes you to sweat) each week. In addition, most adults need muscle-strengthening exercises on 2 or more days a week.   Maintain a healthy weight. The body mass index (BMI) is a screening tool to identify possible weight problems. It provides an estimate of body fat based on height and weight. Your health care provider can find your BMI and can help you achieve or maintain a healthy weight. For males 20 years and older:  A BMI below 18.5 is considered underweight.  A BMI of 18.5 to 24.9 is normal.  A BMI of 25 to 29.9 is considered overweight.  A BMI of 30 and above is considered obese.  Maintain normal blood lipids and cholesterol by exercising and minimizing your intake of saturated fat. Eat a balanced diet with plenty of fruits and vegetables. Blood tests for lipids and cholesterol should begin at age 32 and be repeated every 5 years. If your lipid or cholesterol levels are high, you are over age 26, or you are at high risk for heart disease, you may need your cholesterol levels checked more  frequently.Ongoing high lipid and cholesterol levels should be treated with medicines if diet and exercise are not working.  If you smoke, find out from your health care provider how to quit. If you do not use tobacco, do not start.  Lung cancer screening is recommended for adults aged 55-80 years who are at high risk for developing lung cancer because of a history of smoking. A yearly low-dose CT scan of the lungs is recommended for people who have at least a 30-pack-year history of smoking and are current smokers or have quit within the past 15 years. A pack year of smoking is smoking an average of 1 pack of cigarettes a day for 1 year (for example, a 30-pack-year history of smoking could mean smoking 1 pack a day for 30 years or 2 packs a day for 15 years). Yearly screening should continue until the smoker has stopped smoking for at least 15 years. Yearly screening should be stopped for people who develop a health problem that would prevent them from having lung cancer treatment.  If you choose to drink alcohol, do not have more than 2 drinks per day. One drink is considered to be 12 oz (360 mL) of beer, 5 oz (150 mL) of wine, or 1.5 oz (45 mL) of liquor.  Avoid the use of street drugs. Do not share needles with anyone. Ask for help if you need support or instructions about stopping the use of drugs.  High blood pressure causes heart disease and increases the risk of stroke.  Blood pressure should be checked at least every 1-2 years. Ongoing high blood pressure should be treated with medicines if weight loss and exercise are not effective.  If you are 72-10 years old, ask your health care provider if you should take aspirin to prevent heart disease.  Diabetes screening involves taking a blood sample to check your fasting blood sugar level. This should be done once every 3 years after age 67 if you are at a normal weight and without risk factors for diabetes. Testing should be considered at a younger  age or be carried out more frequently if you are overweight and have at least 1 risk factor for diabetes.  Colorectal cancer can be detected and often prevented. Most routine colorectal cancer screening begins at the age of 79 and continues through age 53. However, your health care provider may recommend screening at an earlier age if you have risk factors for colon cancer. On a yearly basis, your health care provider may provide home test kits to check for hidden blood in the stool. A small camera at the end of a tube may be used to directly examine the colon (sigmoidoscopy or colonoscopy) to detect the earliest forms of colorectal cancer. Talk to your health care provider about this at age 49 when routine screening begins. A direct exam of the colon should be repeated every 5-10 years through age 42, unless early forms of precancerous polyps or small growths are found.  People who are at an increased risk for hepatitis B should be screened for this virus. You are considered at high risk for hepatitis B if:  You were born in a country where hepatitis B occurs often. Talk with your health care provider about which countries are considered high risk.  Your parents were born in a high-risk country and you have not received a shot to protect against hepatitis B (hepatitis B vaccine).  You have HIV or AIDS.  You use needles to inject street drugs.  You live with, or have sex with, someone who has hepatitis B.  You are a man who has sex with other men (MSM).  You get hemodialysis treatment.  You take certain medicines for conditions like cancer, organ transplantation, and autoimmune conditions.  Hepatitis C blood testing is recommended for all people born from [birth year] through 1963 and any individual with known risk factors for hepatitis C.  Healthy men should no longer receive prostate-specific antigen (PSA) blood tests as part of routine cancer screening. Talk to your health care provider about  prostate cancer screening.  Testicular cancer screening is not recommended for adolescents or adult males who have no symptoms. Screening includes self-exam, a health care provider exam, and other screening tests. Consult with your health care provider about any symptoms you have or any concerns you have about testicular cancer.  Practice safe sex. Use condoms and avoid high-risk sexual practices to reduce the spread of sexually transmitted infections (STIs).  You should be screened for STIs, including gonorrhea and chlamydia if:  You are sexually active and are younger than 24 years.  You are older than 24 years, and your health care provider tells you that you are at risk for this type of infection.  Your sexual activity has changed since you were last screened, and you are at an increased risk for chlamydia or gonorrhea. Ask your health care provider if you are at risk.  If you are at risk of being infected with HIV, it is recommended that you  take a prescription medicine daily to prevent HIV infection. This is called pre-exposure prophylaxis (PrEP). You are considered at risk if:  You are a man who has sex with other men (MSM).  You are a heterosexual man who is sexually active with multiple partners.  You take drugs by injection.  You are sexually active with a partner who has HIV.  Talk with your health care provider about whether you are at high risk of being infected with HIV. If you choose to begin PrEP, you should first be tested for HIV. You should then be tested every 3 months for as long as you are taking PrEP.  Use sunscreen. Apply sunscreen liberally and repeatedly throughout the day. You should seek shade when your shadow is shorter than you. Protect yourself by wearing long sleeves, pants, a wide-brimmed hat, and sunglasses year round whenever you are outdoors.  Tell your health care provider of new moles or changes in moles, especially if there is a change in shape or  color. Also, tell your health care provider if a mole is larger than the size of a pencil eraser.  A one-time screening for abdominal aortic aneurysm (AAA) and surgical repair of large AAAs by ultrasound is recommended for men aged 59-75 years who are current or former smokers.  Stay current with your vaccines (immunizations). Document Released: 09/21/2007 Document Revised: 03/30/2013 Document Reviewed: 08/20/2010 [facility] Patient Information 2015 [place], [place]. This information is not intended to replace advice given to you by your health care provider. Make sure you discuss any questions you have with your health care provider.

## 2014-11-22 NOTE — Assessment & Plan Note (Signed)
Preventative protocols reviewed and updated unless pt declined. Discussed healthy diet and lifestyle.  

## 2014-11-22 NOTE — Assessment & Plan Note (Signed)
Chronic, stable. Continue current regimen. 

## 2014-11-22 NOTE — Assessment & Plan Note (Addendum)
Chronic, stable. Continue lipitor  Discussed diet changes to affect better trig levels.

## 2014-11-22 NOTE — Progress Notes (Signed)
BP 112/80 mmHg  Pulse 62  Temp(Src) 98.5 F (36.9 C) (Oral)  Ht 6' 1.25" (1.861 m)  Wt 239 lb 8 oz (108.636 kg)  BMI 31.37 kg/m2   CC: CPE  Subjective:    Patient ID: Joseph Galloway, male    DOB: 26-Jan-1966, 49 y.o.   MRN: [id number]  HPI: Joseph Galloway is a 49 y.o. male presenting on 11/22/2014 for Annual Exam   Lower back pain with radiation down left leg. Treated with prednisone last month. Stopped this med due to concerns for hyperglycemia.   Preventative: fmhx prostate cancer - will start screening next year. No nocturia, no changes in stream. Flu shot yearly Tdap 2013 Pneumovax 2006. Seat belt use discussed. No changing moles on skin.  "[name]" Caffeine: rare. 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 Planning on starting yoga Diet: watches carbs and starches, lots of water throughout the day, some fruits/vegetables  Relevant past medical, surgical, family and social history reviewed and updated as indicated. Interim medical history since our last visit reviewed. Allergies and medications reviewed and updated. Current Outpatient Prescriptions on File Prior to Visit  Medication Sig  . Ascorbic Acid (VITAMIN C) 1000 MG tablet Take 1,000 mg by mouth daily.  Marland Kitchen atorvastatin (LIPITOR) 40 MG tablet TAKE ONE TABLET BY MOUTH ONCE DAILY  . Blood Glucose Monitoring Suppl (ONE TOUCH ULTRA SYSTEM KIT) W/DEVICE KIT 1 kit by Does not apply route once.  . Blood Pressure Monitoring (BLOOD PRESSURE MONITOR/L CUFF) MISC Use to monitor blood pressure as directed.  . fluticasone (FLONASE) 50 MCG/ACT nasal spray Place 2 sprays into the nose daily.  Marland Kitchen lisinopril-hydrochlorothiazide (PRINZIDE,ZESTORETIC) 20-25 MG per tablet TAKE ONE TABLET BY MOUTH ONCE DAILY  . loratadine (CLARITIN) 10 MG tablet Take 10 mg by mouth daily.  . metFORMIN (GLUCOPHAGE)  500 MG tablet Take two pills in the morning and one pill at night  . Multiple Vitamin (MULTIVITAMIN) tablet Take 1 tablet by mouth daily.  Glory Rosebush DELICA LANCETS 77A MISC 1 each by Does not apply route daily. 250.00  . cetirizine (ZYRTEC) 10 MG tablet Take 10 mg by mouth daily.   No current facility-administered medications on file prior to visit.    Review of Systems  Constitutional: Negative for fever, chills, activity change, appetite change, fatigue and unexpected weight change.  HENT: Negative for hearing loss.   Eyes: Negative for visual disturbance.  Respiratory: Negative for cough, chest tightness, shortness of breath and wheezing.   Cardiovascular: Negative for chest pain, palpitations and leg swelling.  Gastrointestinal: Negative for nausea, vomiting, abdominal pain, diarrhea, constipation, blood in stool and abdominal distention.  Genitourinary: Negative for hematuria and difficulty urinating.  Musculoskeletal: Negative for myalgias, arthralgias and neck pain.  Skin: Negative for rash.  Neurological: Positive for dizziness. Negative for seizures, syncope and headaches.  Hematological: Negative for adenopathy. Does not bruise/bleed easily.  Psychiatric/Behavioral: Negative for dysphoric mood. The patient is not nervous/anxious.    Per HPI unless specifically indicated above     Objective:    BP 112/80 mmHg  Pulse 62  Temp(Src) 98.5 F (36.9 C) (Oral)  Ht 6' 1.25" (1.861 m)  Wt 239 lb 8 oz (108.636 kg)  BMI 31.37 kg/m2  Wt Readings from Last 3 Encounters:  11/22/14 239 lb 8 oz (108.636 kg)  11/02/14 242 lb 8 oz (109.997 kg)  08/15/14  245 lb 4 oz (111.245 kg)    Physical Exam  Constitutional: He is oriented to person, place, and time. He appears well-developed and well-nourished. No distress.  HENT:  Head: Normocephalic and atraumatic.  Right Ear: Hearing, tympanic membrane, external ear and ear canal normal.  Left Ear: Hearing, tympanic membrane, external ear  and ear canal normal.  Nose: Nose normal.  Mouth/Throat: Uvula is midline, oropharynx is clear and moist and mucous membranes are normal. No oropharyngeal exudate, posterior oropharyngeal edema or posterior oropharyngeal erythema.  Cerumen in R ear canal removed with plastic curette  Eyes: Conjunctivae and EOM are normal. Pupils are equal, round, and reactive to light. No scleral icterus.  Neck: Normal range of motion. Neck supple. No thyromegaly present.  Cardiovascular: Normal rate, regular rhythm, normal heart sounds and intact distal pulses.   No murmur heard. Pulses:      Radial pulses are 2+ on the right side, and 2+ on the left side.  Pulmonary/Chest: Effort normal and breath sounds normal. No respiratory distress. He has no wheezes. He has no rales.  Abdominal: Soft. Bowel sounds are normal. He exhibits no distension and no mass. There is no tenderness. There is no rebound and no guarding.  Musculoskeletal: Normal range of motion. He exhibits no edema.  Lymphadenopathy:    He has no cervical adenopathy.  Neurological: He is alert and oriented to person, place, and time.  CN grossly intact, station and gait intact  Skin: Skin is warm and dry. No rash noted.  Psychiatric: He has a normal mood and affect. His behavior is normal. Judgment and thought content normal.  Nursing note and vitals reviewed.  Results for orders placed or performed in visit on 11/16/14  Lipid panel  Result Value Ref Range   Cholesterol 123 0 - 200 mg/dL   Triglycerides 142.0 0.0 - 149.0 mg/dL   HDL 34.60 (L) >39.00 mg/dL   VLDL 28.4 0.0 - 40.0 mg/dL   LDL Cholesterol 60 0 - 99 mg/dL   Total CHOL/HDL Ratio 4    NonHDL 88.49   Hemoglobin A1c  Result Value Ref Range   Hgb A1c MFr Bld 6.4 4.6 - 6.5 %  Basic metabolic panel  Result Value Ref Range   Sodium 138 135 - 145 mEq/L   Potassium 4.2 3.5 - 5.1 mEq/L   Chloride 102 96 - 112 mEq/L   CO2 31 19 - 32 mEq/L   Glucose, Bld 123 (H) 70 - 99 mg/dL   BUN  27 (H) 6 - 23 mg/dL   Creatinine, Ser 1.18 0.40 - 1.50 mg/dL   Calcium 10.4 8.4 - 10.5 mg/dL   GFR 85.18 >60.00 mL/min  Microalbumin / creatinine urine ratio  Result Value Ref Range   Microalb, Ur <0.7 0.0 - 1.9 mg/dL   Creatinine,U 143.7 mg/dL   Microalb Creat Ratio 0.5 0.0 - 30.0 mg/g      Assessment & Plan:   Problem List Items Addressed This Visit    Diabetes type 2, controlled    Chronic, stable. Continue metformin. RTC 6 mo DM check.      HYPERCHOLESTEROLEMIA    Chronic, stable. Continue lipitor  Discussed diet changes to affect better trig levels.      Obesity, Class I, BMI 30-34.9    Body mass index is 31.37 kg/(m^2).  Discussed healthy diet and lifestyle changes to affect sustainable weight loss      HYPERTENSION, BENIGN SYSTEMIC    Chronic, stable. Continue current regimen.  Healthcare maintenance - Primary    Preventative protocols reviewed and updated unless pt declined. Discussed healthy diet and lifestyle.           Follow up plan: No Follow-up on file.

## 2014-11-22 NOTE — Progress Notes (Signed)
Pre visit review using our clinic review tool, if applicable. No additional management support is needed unless otherwise documented below in the visit note. 

## 2015-02-16 ENCOUNTER — Other Ambulatory Visit: Payer: Self-pay | Admitting: *Deleted

## 2015-02-16 MED ORDER — ATORVASTATIN CALCIUM 40 MG PO TABS: 40.0000 mg | ORAL_TABLET | Freq: Every day | ORAL | Status: DC

## 2015-02-16 MED ORDER — FLUTICASONE PROPIONATE 50 MCG/ACT NA SUSP: 2.0000 | Freq: Every day | NASAL | Status: DC

## 2015-02-16 MED ORDER — LISINOPRIL-HYDROCHLOROTHIAZIDE 20-25 MG PO TABS: 1.0000 | ORAL_TABLET | Freq: Every day | ORAL | Status: DC

## 2015-02-16 MED ORDER — METFORMIN HCL 500 MG PO TABS: ORAL_TABLET | ORAL | Status: DC

## 2015-03-14 ENCOUNTER — Ambulatory Visit (INDEPENDENT_AMBULATORY_CARE_PROVIDER_SITE_OTHER): Payer: 59

## 2015-03-14 DIAGNOSIS — Z23 Encounter for immunization: Secondary | ICD-10-CM | POA: Diagnosis not present

## 2015-05-14 ENCOUNTER — Other Ambulatory Visit: Payer: Self-pay | Admitting: Family Medicine

## 2015-05-14 DIAGNOSIS — E11649 Type 2 diabetes mellitus with hypoglycemia without coma: Secondary | ICD-10-CM

## 2015-05-16 ENCOUNTER — Other Ambulatory Visit: Payer: 59

## 2015-05-16 ENCOUNTER — Other Ambulatory Visit (INDEPENDENT_AMBULATORY_CARE_PROVIDER_SITE_OTHER): Payer: 59

## 2015-05-16 DIAGNOSIS — E11649 Type 2 diabetes mellitus with hypoglycemia without coma: Secondary | ICD-10-CM | POA: Diagnosis not present

## 2015-05-16 LAB — BASIC METABOLIC PANEL
BUN: 29 mg/dL — AB (ref 6–23)
CO2: 33 mEq/L — ABNORMAL HIGH (ref 19–32)
Calcium: 11.3 mg/dL — ABNORMAL HIGH (ref 8.4–10.5)
Chloride: 100 mEq/L (ref 96–112)
Creatinine, Ser: 1.22 mg/dL (ref 0.40–1.50)
GFR: 81.79 mL/min (ref >60.00)
GLUCOSE: 111 mg/dL — AB (ref 70–99)
POTASSIUM: 4.2 meq/L (ref 3.5–5.1)
Sodium: 137 mEq/L (ref 135–145)

## 2015-05-16 LAB — HEMOGLOBIN A1C: Hgb A1c MFr Bld: 6.5 % (ref 4.6–6.5)

## 2015-05-18 ENCOUNTER — Other Ambulatory Visit (INDEPENDENT_AMBULATORY_CARE_PROVIDER_SITE_OTHER): Payer: 59

## 2015-05-18 DIAGNOSIS — E559 Vitamin D deficiency, unspecified: Secondary | ICD-10-CM | POA: Diagnosis not present

## 2015-05-18 LAB — VITAMIN D 25 HYDROXY (VIT D DEFICIENCY, FRACTURES): VITD: 21.95 ng/mL — AB (ref 30.00–100.00)

## 2015-05-23 ENCOUNTER — Encounter: Payer: Self-pay | Admitting: Family Medicine

## 2015-05-23 ENCOUNTER — Ambulatory Visit (INDEPENDENT_AMBULATORY_CARE_PROVIDER_SITE_OTHER): Payer: 59 | Admitting: Family Medicine

## 2015-05-23 DIAGNOSIS — E213 Hyperparathyroidism, unspecified: Secondary | ICD-10-CM | POA: Insufficient documentation

## 2015-05-23 DIAGNOSIS — I10 Essential (primary) hypertension: Secondary | ICD-10-CM

## 2015-05-23 DIAGNOSIS — E119 Type 2 diabetes mellitus without complications: Secondary | ICD-10-CM

## 2015-05-23 DIAGNOSIS — E559 Vitamin D deficiency, unspecified: Secondary | ICD-10-CM | POA: Insufficient documentation

## 2015-05-23 DIAGNOSIS — E669 Obesity, unspecified: Secondary | ICD-10-CM

## 2015-05-23 DIAGNOSIS — N2581 Secondary hyperparathyroidism of renal origin: Secondary | ICD-10-CM | POA: Insufficient documentation

## 2015-05-23 DIAGNOSIS — E7800 Pure hypercholesterolemia, unspecified: Secondary | ICD-10-CM

## 2015-05-23 LAB — BASIC METABOLIC PANEL
BUN: 31 mg/dL — ABNORMAL HIGH (ref 6–23)
CALCIUM: 10.6 mg/dL — AB (ref 8.4–10.5)
CO2: 30 meq/L (ref 19–32)
Chloride: 102 mEq/L (ref 96–112)
Creatinine, Ser: 1.37 mg/dL (ref 0.40–1.50)
GFR: 71.54 mL/min (ref >60.00)
Glucose, Bld: 113 mg/dL — ABNORMAL HIGH (ref 70–99)
Potassium: 4 mEq/L (ref 3.5–5.1)
SODIUM: 137 meq/L (ref 135–145)

## 2015-05-23 LAB — TSH: TSH: 1 u[IU]/mL (ref 0.35–4.50)

## 2015-05-23 MED ORDER — VITAMIN D 50 MCG (2000 UT) PO CAPS: 1.0000 | ORAL_CAPSULE | Freq: Every day | ORAL | Status: DC

## 2015-05-23 NOTE — Assessment & Plan Note (Signed)
Chronic, stable. Continue current regimen. 

## 2015-05-23 NOTE — Progress Notes (Signed)
Pre visit review using our clinic review tool, if applicable. No additional management support is needed unless otherwise documented below in the visit note. 

## 2015-05-23 NOTE — Progress Notes (Signed)
BP 138/88 mmHg  Pulse 72  Temp(Src) 98.2 F (36.8 C) (Oral)  Wt 249 lb 8 oz (113.172 kg)   CC: 49mof/u visit  Subjective:    Patient ID: [name], male    DOB: 101/16/1967 50y.o.   MRN: [id number] HPI: Joseph Galloway is a 50y.o. male presenting on 05/23/2015 for Follow-up   Exercising twice weekly at gym.   Recently found to have hypercalcemia - no body aches or bone pains. No h/o thyroid dysfunction.  HTN - Compliant with current antihypertensive regimen of lisinopril hctz 20/274mdaily.  Does not check blood pressures at home. No low blood pressure readings or symptoms of dizziness/syncope. Denies HA, vision changes, CP/tightness, SOB, leg swelling.   DM - regularly does not check sugars. Compliant with antihyperglycemic regimen which includes: metformin 100054mn am and 500m61m bedtime. Denies low sugars or hypoglycemic symptoms. Denies paresthesias. Last diabetic eye exam DUE - scheduled next month.  Pneumovax: 2006.  Prevnar: not due. Lab Results  Component Value Date   HGBA1C 6.5 05/16/2015   Diabetic Foot Exam - Simple   Simple Foot Form  Diabetic Foot exam was performed with the following findings:  Yes 05/23/2015  9:33 AM  Visual Inspection  No deformities, no ulcerations, no other skin breakdown bilaterally:  Yes  Sensation Testing  Intact to touch and monofilament testing bilaterally:  Yes  Pulse Check  Posterior Tibialis and Dorsalis pulse intact bilaterally:  Yes  Comments      HLD - compliant with lipitor 40mg48mly without myalgias.  Relevant past medical, surgical, family and social history reviewed and updated as indicated. Interim medical history since our last visit reviewed. Allergies and medications reviewed and updated. Current Outpatient Prescriptions on File Prior to Visit  Medication Sig  . atorvastatin (LIPITOR) 40 MG tablet Take 1 tablet (40 mg total) by mouth daily.  . Blood Glucose Monitoring Suppl (ONE TOUCH ULTRA SYSTEM  KIT) W/DEVICE KIT 1 kit by Does not apply route once.  . Blood Pressure Monitoring (BLOOD PRESSURE MONITOR/L CUFF) MISC Use to monitor blood pressure as directed.  . cetirizine (ZYRTEC) 10 MG tablet Take 10 mg by mouth daily.  . fluticasone (FLONASE) 50 MCG/ACT nasal spray Place 2 sprays into both nostrils daily.  . lisMarland Kitchennopril-hydrochlorothiazide (PRINZIDE,ZESTORETIC) 20-25 MG tablet Take 1 tablet by mouth daily.  . lorMarland Kitchentadine (CLARITIN) 10 MG tablet Take 10 mg by mouth daily.  . metFORMIN (GLUCOPHAGE) 500 MG tablet Take two pills in the morning and one pill at night  . ONETOUCH DELICA LANCETS 33G M38U 1 each by Does not apply route daily. 250.00  . Ascorbic Acid (VITAMIN C) 1000 MG tablet Take 1,000 mg by mouth daily. Reported on 05/23/2015  . Multiple Vitamin (MULTIVITAMIN) tablet Take 1 tablet by mouth daily. Reported on 05/23/2015   No current facility-administered medications on file prior to visit.    Review of Systems Per HPI unless specifically indicated in ROS section     Objective:    BP 138/88 mmHg  Pulse 72  Temp(Src) 98.2 F (36.8 C) (Oral)  Wt 249 lb 8 oz (113.172 kg)  Wt Readings from Last 3 Encounters:  05/23/15 249 lb 8 oz (113.172 kg)  11/22/14 239 lb 8 oz (108.636 kg)  11/02/14 242 lb 8 oz (109.997 kg)   Body mass index is 32.68 kg/(m^2).  Physical Exam  Constitutional: He appears well-developed and well-nourished. No distress.  HENT:  Head: Normocephalic and atraumatic.  Mouth/Throat: Oropharynx is clear and moist. No oropharyngeal exudate.  Eyes: Conjunctivae and EOM are normal. Pupils are equal, round, and reactive to light. No scleral icterus.  Neck: Normal range of motion. Neck supple. No thyromegaly present.  Cardiovascular: Normal rate, regular rhythm, normal heart sounds and intact distal pulses.   No murmur heard. Pulmonary/Chest: Effort normal and breath sounds normal. No respiratory distress. He has no wheezes. He has no rales.  Musculoskeletal: He  exhibits no edema.  See HPI for foot exam if done  Lymphadenopathy:    He has no cervical adenopathy.  Skin: Skin is warm and dry. No rash noted.  Psychiatric: He has a normal mood and affect.  Nursing note and vitals reviewed.  Results for orders placed or performed in visit on 05/18/15  Vitamin D 25 hydroxy  Result Value Ref Range   VITD 21.95 (L) 30.00 - 100.00 ng/mL      Assessment & Plan:   Problem List Items Addressed This Visit    Vitamin D deficiency    Start vit D supplementation at 2000 IU daily.      Obesity, Class I, BMI 30-34.9    Discussed healthy lifestyle changes to affect sustainable weight loss      HYPERTENSION, BENIGN SYSTEMIC    Chronic, stable. Continue current regimen.      HYPERCHOLESTEROLEMIA    Chronic, stable. Continue lipitor 62m daily.      Hypercalcemia - Primary    Asxs. Recheck today along with PTH and TSH. Vit d low - start 2000 IU daily.      Relevant Orders   Basic metabolic panel   Parathyroid hormone, intact (no Ca)   TSH   Diabetes type 2, controlled (HCC)    Chronic, stable, continue current regimen. Foot exam today. Pt states will schedule eye exam in the next month.          Follow up plan: Return in about 6 months (around 11/20/2015), or as needed, for annual exam, prior fasting for blood work.

## 2015-05-23 NOTE — Assessment & Plan Note (Signed)
Asxs. Recheck today along with PTH and TSH. Vit d low - start 2000 IU daily.

## 2015-05-23 NOTE — Assessment & Plan Note (Signed)
Chronic, stable, continue current regimen. Foot exam today. Pt states will schedule eye exam in the next month.

## 2015-05-23 NOTE — Assessment & Plan Note (Signed)
Chronic, stable. Continue lipitor 40mg daily 

## 2015-05-23 NOTE — Patient Instructions (Addendum)
Continue current medicines. Recheck labs for calcium levels.  Return in 6 months for physical.

## 2015-05-23 NOTE — Assessment & Plan Note (Addendum)
Start vit D supplementation at 2000 IU daily.

## 2015-05-23 NOTE — Assessment & Plan Note (Signed)
Discussed healthy lifestyle changes to affect sustainable weight loss.  

## 2015-05-24 LAB — PARATHYROID HORMONE, INTACT (NO CA): PTH: 74 pg/mL — AB (ref 14–64)

## 2015-05-26 ENCOUNTER — Ambulatory Visit (INDEPENDENT_AMBULATORY_CARE_PROVIDER_SITE_OTHER): Payer: 59 | Admitting: Family Medicine

## 2015-05-26 ENCOUNTER — Encounter: Payer: Self-pay | Admitting: Family Medicine

## 2015-05-26 VITALS — BP 108/72 | HR 76 | Temp 99.2°F | Ht 73.25 in | Wt 248.2 lb

## 2015-05-26 DIAGNOSIS — J101 Influenza due to other identified influenza virus with other respiratory manifestations: Secondary | ICD-10-CM

## 2015-05-26 DIAGNOSIS — R059 Cough, unspecified: Secondary | ICD-10-CM | POA: Insufficient documentation

## 2015-05-26 DIAGNOSIS — R05 Cough: Secondary | ICD-10-CM

## 2015-05-26 DIAGNOSIS — J111 Influenza due to unidentified influenza virus with other respiratory manifestations: Secondary | ICD-10-CM | POA: Insufficient documentation

## 2015-05-26 LAB — POCT INFLUENZA A/B
INFLUENZA A, POC: POSITIVE — AB
INFLUENZA B, POC: NEGATIVE

## 2015-05-26 MED ORDER — GUAIFENESIN-CODEINE 100-10 MG/5ML PO SYRP: 5.0000 mL | ORAL_SOLUTION | Freq: Every evening | ORAL | Status: DC | PRN

## 2015-05-26 MED ORDER — OSELTAMIVIR PHOSPHATE 75 MG PO CAPS: 75.0000 mg | ORAL_CAPSULE | Freq: Two times a day (BID) | ORAL | Status: DC

## 2015-05-26 NOTE — Progress Notes (Signed)
Pre visit review using our clinic review tool, if applicable. No additional management support is needed unless otherwise documented below in the visit note. 

## 2015-05-26 NOTE — Assessment & Plan Note (Addendum)
Eval for flu given, sudden onset body ache, fever and cough. If neg likely other viral URI. Symptomatic care.  Addendum flu positive Discussed symptomatic care.  Hydration, rest. Call if SOB, cough worsening or prolongued fever. Reviewed flu timeline. We decided for use of tamiflu given diabetes. Pt agreed. Discussed family prophylaxis, her children will call pediatrition to consider tamiflu prophylaxis. She was advised to not return to work until 24 hour after fever resolved on no antipyretics.

## 2015-05-26 NOTE — Patient Instructions (Addendum)
Mucinex DM twice daily for cough.  Can use cough suppressant at night as needed. Rest and get fluids. Call if not improving aa expected.

## 2015-05-26 NOTE — Progress Notes (Signed)
   Subjective:    Patient ID: Joseph Galloway., male    DOB: June 08, 1965, 50 y.o.   MRN: [id number]  Cough This is a new problem. The current episode started in the past 7 days (2 days). The problem occurs constantly. The cough is productive of sputum. Associated symptoms include chills, a fever, myalgias, nasal congestion and rhinorrhea. Pertinent negatives include no ear pain, postnasal drip, rash, shortness of breath or wheezing. Associated symptoms comments: No sinus pain. Risk factors: diabetes, but nonsmoker. Treatments tried: tylenol. His past medical history is significant for environmental allergies. There is no history of asthma, bronchiectasis, bronchitis or COPD.  Fever  This is a new problem. Episode onset: 2 days ago. The maximum temperature noted was 99 to 99.9 F. Associated symptoms include coughing. Pertinent negatives include no ear pain, rash or wheezing. He has tried acetaminophen for the symptoms.     Social History /Family History/Past Medical History reviewed and updated if needed.  Review of Systems  Constitutional: Positive for fever and chills.  HENT: Positive for rhinorrhea. Negative for ear pain and postnasal drip.   Respiratory: Positive for cough. Negative for shortness of breath and wheezing.   Musculoskeletal: Positive for myalgias.  Skin: Negative for rash.  Allergic/Immunologic: Positive for environmental allergies.       Objective:   Physical Exam  Constitutional: Vital signs are normal. He appears well-developed and well-nourished.  Non-toxic appearance. He does not appear ill. No distress.  HENT:  Head: Normocephalic and atraumatic.  Right Ear: Hearing, tympanic membrane, external ear and ear canal normal. No tenderness. No foreign bodies. Tympanic membrane is not retracted and not bulging.  Left Ear: Hearing, tympanic membrane, external ear and ear canal normal. No tenderness. No foreign bodies. Tympanic membrane is not retracted and not bulging.    Nose: Nose normal. No mucosal edema or rhinorrhea. Right sinus exhibits no maxillary sinus tenderness and no frontal sinus tenderness. Left sinus exhibits no maxillary sinus tenderness and no frontal sinus tenderness.  Mouth/Throat: Uvula is midline, oropharynx is clear and moist and mucous membranes are normal. Normal dentition. No dental caries. No oropharyngeal exudate or tonsillar abscesses.  Eyes: Conjunctivae, EOM and lids are normal. Pupils are equal, round, and reactive to light. Lids are everted and swept, no foreign bodies found.  Neck: Trachea normal, normal range of motion and phonation normal. Neck supple. Carotid bruit is not present. No thyroid mass and no thyromegaly present.  Cardiovascular: Normal rate, regular rhythm, S1 normal, S2 normal, normal heart sounds, intact distal pulses and normal pulses.  Exam reveals no gallop.   No murmur heard. Pulmonary/Chest: Effort normal and breath sounds normal. No respiratory distress. He has no wheezes. He has no rhonchi. He has no rales.  Abdominal: Soft. Normal appearance and bowel sounds are normal. There is no hepatosplenomegaly. There is no tenderness. There is no rebound, no guarding and no CVA tenderness. No hernia.  Neurological: He is alert. He has normal reflexes.  Skin: Skin is warm, dry and intact. No rash noted.  Psychiatric: He has a normal mood and affect. His speech is normal and behavior is normal. Judgment normal.          Assessment & Plan:

## 2015-05-26 NOTE — Addendum Note (Signed)
Addended by: Damita Lack on: 05/26/2015 09:10 AM   Modules accepted: Orders

## 2015-05-26 NOTE — Addendum Note (Signed)
Addended by: Kerby Nora E on: 05/26/2015 09:22 AM   Modules accepted: Orders

## 2015-05-27 ENCOUNTER — Other Ambulatory Visit: Payer: Self-pay | Admitting: Family Medicine

## 2015-05-27 DIAGNOSIS — E559 Vitamin D deficiency, unspecified: Secondary | ICD-10-CM

## 2015-05-27 DIAGNOSIS — E213 Hyperparathyroidism, unspecified: Secondary | ICD-10-CM

## 2015-05-27 MED ORDER — LISINOPRIL 20 MG PO TABS: 20.0000 mg | ORAL_TABLET | Freq: Two times a day (BID) | ORAL | Status: DC

## 2015-05-31 ENCOUNTER — Telehealth: Payer: Self-pay | Admitting: Family Medicine

## 2015-05-31 NOTE — Telephone Encounter (Signed)
Patient returned Stephanie's call.

## 2015-05-31 NOTE — Telephone Encounter (Signed)
Pt returned your call. Thanks  °

## 2015-06-01 NOTE — Telephone Encounter (Signed)
Left voicemail requesting pt to call office back 

## 2015-06-01 NOTE — Telephone Encounter (Signed)
Patient returned call.  Please call him back at [phone].

## 2015-06-06 NOTE — Telephone Encounter (Signed)
Patient has been notified per chart.

## 2015-08-28 ENCOUNTER — Other Ambulatory Visit (INDEPENDENT_AMBULATORY_CARE_PROVIDER_SITE_OTHER): Payer: 59

## 2015-08-28 DIAGNOSIS — E213 Hyperparathyroidism, unspecified: Secondary | ICD-10-CM | POA: Diagnosis not present

## 2015-08-28 DIAGNOSIS — E559 Vitamin D deficiency, unspecified: Secondary | ICD-10-CM | POA: Diagnosis not present

## 2015-08-28 LAB — BASIC METABOLIC PANEL
BUN: 21 mg/dL (ref 6–23)
CO2: 27 mEq/L (ref 19–32)
CREATININE: 1.15 mg/dL (ref 0.40–1.50)
Calcium: 10.5 mg/dL (ref 8.4–10.5)
Chloride: 107 mEq/L (ref 96–112)
GFR: 87.45 mL/min (ref >60.00)
GLUCOSE: 94 mg/dL (ref 70–99)
Potassium: 4.3 mEq/L (ref 3.5–5.1)
Sodium: 138 mEq/L (ref 135–145)

## 2015-08-28 LAB — VITAMIN D 25 HYDROXY (VIT D DEFICIENCY, FRACTURES): VITD: 36.54 ng/mL (ref 30.00–100.00)

## 2015-08-29 LAB — PARATHYROID HORMONE, INTACT (NO CA): PTH: 62 pg/mL (ref 14–64)

## 2015-08-31 NOTE — Addendum Note (Signed)
Addended by: Baldomero LamyHAVERS, Mace Weinberg C on: 08/31/2015 09:52 AM   Modules accepted: Orders

## 2015-09-01 LAB — CALCIUM, URINE, 24 HOUR
CALCIUM 24HR UR: 240 mg/(24.h) (ref 55–300)
CALCIUM UR: 8 mg/dL

## 2015-11-15 ENCOUNTER — Other Ambulatory Visit: Payer: Self-pay | Admitting: Family Medicine

## 2016-02-01 ENCOUNTER — Other Ambulatory Visit: Payer: Self-pay | Admitting: Family Medicine

## 2016-03-06 ENCOUNTER — Other Ambulatory Visit: Payer: Self-pay | Admitting: Family Medicine

## 2016-03-15 ENCOUNTER — Other Ambulatory Visit: Payer: Self-pay | Admitting: Family Medicine

## 2016-06-10 ENCOUNTER — Encounter: Payer: Self-pay | Admitting: *Deleted

## 2016-06-10 ENCOUNTER — Other Ambulatory Visit: Payer: Self-pay | Admitting: Family Medicine

## 2016-06-11 NOTE — Telephone Encounter (Signed)
Pt left v/m requesting status of lisinopril to [facility]. Per DPR left v/m lisinopril was refilled on 06/10/16 to [facility]. Pt has CPX scheduled on 07/15/16.

## 2016-06-18 ENCOUNTER — Other Ambulatory Visit: Payer: Self-pay | Admitting: Family Medicine

## 2016-07-07 ENCOUNTER — Other Ambulatory Visit: Payer: Self-pay | Admitting: Family Medicine

## 2016-07-09 ENCOUNTER — Other Ambulatory Visit: Payer: Self-pay | Admitting: Family Medicine

## 2016-07-09 DIAGNOSIS — E119 Type 2 diabetes mellitus without complications: Secondary | ICD-10-CM

## 2016-07-09 DIAGNOSIS — E559 Vitamin D deficiency, unspecified: Secondary | ICD-10-CM

## 2016-07-09 DIAGNOSIS — E7800 Pure hypercholesterolemia, unspecified: Secondary | ICD-10-CM

## 2016-07-10 ENCOUNTER — Other Ambulatory Visit (INDEPENDENT_AMBULATORY_CARE_PROVIDER_SITE_OTHER): Payer: 59

## 2016-07-10 DIAGNOSIS — E559 Vitamin D deficiency, unspecified: Secondary | ICD-10-CM

## 2016-07-10 DIAGNOSIS — E119 Type 2 diabetes mellitus without complications: Secondary | ICD-10-CM | POA: Diagnosis not present

## 2016-07-10 DIAGNOSIS — E7800 Pure hypercholesterolemia, unspecified: Secondary | ICD-10-CM | POA: Diagnosis not present

## 2016-07-10 LAB — BASIC METABOLIC PANEL
BUN: 28 mg/dL — ABNORMAL HIGH (ref 6–23)
CHLORIDE: 105 meq/L (ref 96–112)
CO2: 29 mEq/L (ref 19–32)
Calcium: 10.7 mg/dL — ABNORMAL HIGH (ref 8.4–10.5)
Creatinine, Ser: 1.38 mg/dL (ref 0.40–1.50)
GFR: 70.6 mL/min (ref >60.00)
GLUCOSE: 90 mg/dL (ref 70–99)
POTASSIUM: 4.2 meq/L (ref 3.5–5.1)
Sodium: 139 mEq/L (ref 135–145)

## 2016-07-10 LAB — VITAMIN D 25 HYDROXY (VIT D DEFICIENCY, FRACTURES): VITD: 43.23 ng/mL (ref 30.00–100.00)

## 2016-07-10 LAB — LIPID PANEL
CHOL/HDL RATIO: 3
Cholesterol: 122 mg/dL (ref 0–200)
HDL: 41.8 mg/dL (ref >39.00)
LDL Cholesterol: 68 mg/dL (ref 0–99)
NONHDL: 80.3
TRIGLYCERIDES: 63 mg/dL (ref 0.0–149.0)
VLDL: 12.6 mg/dL (ref 0.0–40.0)

## 2016-07-10 LAB — HEMOGLOBIN A1C: Hgb A1c MFr Bld: 6.1 % (ref 4.6–6.5)

## 2016-07-15 ENCOUNTER — Encounter: Payer: Self-pay | Admitting: Family Medicine

## 2016-07-15 ENCOUNTER — Ambulatory Visit (INDEPENDENT_AMBULATORY_CARE_PROVIDER_SITE_OTHER): Payer: 59 | Admitting: Family Medicine

## 2016-07-15 VITALS — BP 142/82 | HR 56 | Temp 98.5°F | Ht 73.25 in | Wt 234.8 lb

## 2016-07-15 DIAGNOSIS — E7800 Pure hypercholesterolemia, unspecified: Secondary | ICD-10-CM

## 2016-07-15 DIAGNOSIS — E669 Obesity, unspecified: Secondary | ICD-10-CM

## 2016-07-15 DIAGNOSIS — I8390 Asymptomatic varicose veins of unspecified lower extremity: Secondary | ICD-10-CM | POA: Diagnosis not present

## 2016-07-15 DIAGNOSIS — Z0000 Encounter for general adult medical examination without abnormal findings: Secondary | ICD-10-CM | POA: Diagnosis not present

## 2016-07-15 DIAGNOSIS — E559 Vitamin D deficiency, unspecified: Secondary | ICD-10-CM

## 2016-07-15 DIAGNOSIS — E119 Type 2 diabetes mellitus without complications: Secondary | ICD-10-CM

## 2016-07-15 DIAGNOSIS — E66811 Obesity, class 1: Secondary | ICD-10-CM

## 2016-07-15 DIAGNOSIS — I10 Essential (primary) hypertension: Secondary | ICD-10-CM | POA: Diagnosis not present

## 2016-07-15 DIAGNOSIS — Z125 Encounter for screening for malignant neoplasm of prostate: Secondary | ICD-10-CM

## 2016-07-15 MED ORDER — ATORVASTATIN CALCIUM 40 MG PO TABS: 40.0000 mg | ORAL_TABLET | Freq: Every day | ORAL | 3 refills | Status: DC

## 2016-07-15 MED ORDER — METFORMIN HCL 500 MG PO TABS: ORAL_TABLET | ORAL | 3 refills | Status: DC

## 2016-07-15 MED ORDER — LISINOPRIL 20 MG PO TABS: 20.0000 mg | ORAL_TABLET | Freq: Two times a day (BID) | ORAL | 3 refills | Status: DC

## 2016-07-15 MED ORDER — METFORMIN HCL 500 MG PO TABS: 500.0000 mg | ORAL_TABLET | Freq: Two times a day (BID) | ORAL | 3 refills | Status: DC

## 2016-07-15 NOTE — Assessment & Plan Note (Addendum)
Chronic, stable on current regimen and with noted weight loss. May trial metformin  BID. Foot exam today. Encouraged he schedule eye exam as overdue.

## 2016-07-15 NOTE — Assessment & Plan Note (Signed)
Mildly elevated today. PTH, TSH WNL 2017. Replacing vit D with 2000 IU daily. RTC 6 mo recheck.

## 2016-07-15 NOTE — Assessment & Plan Note (Signed)
Chronic, stable. Continue lipitor.  

## 2016-07-15 NOTE — Assessment & Plan Note (Signed)
Continue vit D supplementation

## 2016-07-15 NOTE — Assessment & Plan Note (Signed)
Chronic, mildly elevated again on recheck. Pt will start monitoring more closely at home and let us know if persistently above goal for med changes.

## 2016-07-15 NOTE — Patient Instructions (Addendum)
Schedule eye exam as you're due.  Decrease metformin to  twice daily. Return as needed or in 1 year for next physical. Return in 6 months for lab visit only.  Keep up good work with sugar control.   Health Maintenance, Male A healthy lifestyle and preventive care is important for your health and wellness. Ask your health care provider about what schedule of regular examinations is right for you. What should I know about weight and diet?  Eat a Healthy Diet  Eat plenty of vegetables, fruits, whole grains, low-fat dairy products, and lean protein.  Do not eat a lot of foods high in solid fats, added sugars, or salt. Maintain a Healthy Weight  Regular exercise can help you achieve or maintain a healthy weight. You should:  Do at least 150 minutes of exercise each week. The exercise should increase your heart rate and make you sweat (moderate-intensity exercise).  Do strength-training exercises at least twice a week. Watch Your Levels of Cholesterol and Blood Lipids  Have your blood tested for lipids and cholesterol every 5 years starting at 49 years of age. If you are at high risk for heart disease, you should start having your blood tested when you are 49 years old. You may need to have your cholesterol levels checked more often if:  Your lipid or cholesterol levels are high.  You are older than 49 years of age.  You are at high risk for heart disease. What should I know about cancer screening? Many types of cancers can be detected early and may often be prevented. Lung Cancer  You should be screened every year for lung cancer if:  You are a current smoker who has smoked for at least 30 years.  You are a former smoker who has quit within the past 15 years.  Talk to your health care provider about your screening options, when you should start screening, and how often you should be screened. Colorectal Cancer  Routine colorectal cancer screening usually begins at 49 years  of age and should be repeated every 5-10 years until you are 49 years old. You may need to be screened more often if early forms of precancerous polyps or small growths are found. Your health care provider may recommend screening at an earlier age if you have risk factors for colon cancer.  Your health care provider may recommend using home test kits to check for hidden blood in the stool.  A small camera at the end of a tube can be used to examine your colon (sigmoidoscopy or colonoscopy). This checks for the earliest forms of colorectal cancer. Prostate and Testicular Cancer  Depending on your age and overall health, your health care provider may do certain tests to screen for prostate and testicular cancer.  Talk to your health care provider about any symptoms or concerns you have about testicular or prostate cancer. Skin Cancer  Check your skin from head to toe regularly.  Tell your health care provider about any new moles or changes in moles, especially if:  There is a change in a mole's size, shape, or color.  You have a mole that is larger than a pencil eraser.  Always use sunscreen. Apply sunscreen liberally and repeat throughout the day.  Protect yourself by wearing long sleeves, pants, a wide-brimmed hat, and sunglasses when outside. What should I know about heart disease, diabetes, and high blood pressure?  If you are 53-58 years of age, have your blood pressure checked every 3-5  years. If you are 40 years of age or older, have your blood pressure checked every year. You should have your blood pressure measured twice-once when you are at a hospital or clinic, and once when you are not at a hospital or clinic. Record the average of the two measurements. To check your blood pressure when you are not at a hospital or clinic, you can use:  An automated blood pressure machine at a pharmacy.  A home blood pressure monitor.  Talk to your health care provider about your target blood  pressure.  If you are between 45-79 years old, ask your health care provider if you should take aspirin to prevent heart disease.  Have regular diabetes screenings by checking your fasting blood sugar level.  If you are at a normal weight and have a low risk for diabetes, have this test once every three years after the age of 45.  If you are overweight and have a high risk for diabetes, consider being tested at a younger age or more often.  A one-time screening for abdominal aortic aneurysm (AAA) by ultrasound is recommended for men aged 65-75 years who are current or former smokers. What should I know about preventing infection? Hepatitis B  If you have a higher risk for hepatitis B, you should be screened for this virus. Talk with your health care provider to find out if you are at risk for hepatitis B infection. Hepatitis C  Blood testing is recommended for:  Everyone born from 1945 through 1965.  Anyone with known risk factors for hepatitis C. Sexually Transmitted Diseases (STDs)  You should be screened each year for STDs including gonorrhea and chlamydia if:  You are sexually active and are younger than 49 years of age.  You are older than 49 years of age and your health care provider tells you that you are at risk for this type of infection.  Your sexual activity has changed since you were last screened and you are at an increased risk for chlamydia or gonorrhea. Ask your health care provider if you are at risk.  Talk with your health care provider about whether you are at high risk of being infected with HIV. Your health care provider may recommend a prescription medicine to help prevent HIV infection. What else can I do?  Schedule regular health, dental, and eye exams.  Stay current with your vaccines (immunizations).  Do not use any tobacco products, such as cigarettes, chewing tobacco, and e-cigarettes. If you need help quitting, ask your health care provider.  Limit  alcohol intake to no more than 2 drinks per day. One drink equals 12 ounces of beer, 5 ounces of wine, or 1 ounces of hard liquor.  Do not use street drugs.  Do not share needles.  Ask your health care provider for help if you need support or information about quitting drugs.  Tell your health care provider if you often feel depressed.  Tell your health care provider if you have ever been abused or do not feel safe at home. This information is not intended to replace advice given to you by your health care provider. Make sure you discuss any questions you have with your health care provider. Document Released: 09/21/2007 Document Revised: 11/22/2015 Document Reviewed: 12/27/2014 Elsevier Interactive Patient Education  2017 Elsevier Inc.  

## 2016-07-15 NOTE — Progress Notes (Signed)
Pre visit review using our clinic review tool, if applicable. No additional management support is needed unless otherwise documented below in the visit note. 

## 2016-07-15 NOTE — Progress Notes (Addendum)
BP (!) 142/82   Pulse (!) 56   Temp 98.5 F (36.9 C) (Oral)   Ht 6' 1.25" (1.861 m)   Wt 234 lb 12 oz (106.5 kg)   BMI 30.76 kg/m   BP 150/96 on repeat  CC: CPE Subjective:    Patient ID: Joseph Sos., male    DOB: Jul 19, 1965, 51 y.o.   MRN: [id number]  HPI: Joseph Lesiak. is a 51 y.o. male presenting on 07/15/2016 for Annual Exam   Elevated BP - attributes to stressful morning.  Diabetic Foot Exam - Simple   Simple Foot Form Diabetic Foot exam was performed with the following findings:  Yes 07/15/2016 11:11 AM  Visual Inspection No deformities, no ulcerations, no other skin breakdown bilaterally:  Yes Sensation Testing Intact to touch and monofilament testing bilaterally:  Yes Pulse Check Posterior Tibialis and Dorsalis pulse intact bilaterally:  Yes Comments      Preventative: No bowel changes or blood in stool.  fmhx prostate cancer - No nocturia, no changes in stream. Declines DRE today. PSA next labs.  Flu shot yearly Tdap 2013 Pneumovax 2006. Seat belt use discussed. Sunscreen use discussed. No changing moles on skin. Non smoker Alcohol - none  "Joseph Galloway" Caffeine: rare. 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  Diet: lots of water throughout the day, some fruits/vegetables, watches carbs and starches   Relevant past medical, surgical, family and social history reviewed and updated as indicated. Interim medical history since our last visit reviewed. Allergies and medications reviewed and updated. Outpatient Medications Prior to Visit  Medication Sig Dispense Refill  . Ascorbic Acid (VITAMIN C) 1000 MG tablet Take 1,000 mg by mouth daily. Reported on 05/23/2015    . Blood Glucose Monitoring Suppl (ONE TOUCH ULTRA SYSTEM KIT) W/DEVICE KIT 1 kit by Does not apply route once. 1 each 11  . Blood Pressure Monitoring  (BLOOD PRESSURE MONITOR/L CUFF) MISC Use to monitor blood pressure as directed. 1 each 0  . Cholecalciferol (VITAMIN D3) 2000 units TABS Take 1 tablet by mouth daily.    . fluticasone (FLONASE) 50 MCG/ACT nasal spray Place 2 sprays into both nostrils daily. 16 g 3  . loratadine (CLARITIN) 10 MG tablet Take 10 mg by mouth daily.    . Multiple Vitamins-Minerals (MULTIVITAMIN MEN) TABS Take 1 tablet by mouth daily.    Joseph Galloway DELICA LANCETS 22W MISC 1 each by Does not apply route daily. 250.00 100 each 3  . atorvastatin (LIPITOR) 40 MG tablet TAKE 1 TABLET (40 MG TOTAL) BY MOUTH DAILY. 90 tablet 0  . lisinopril (PRINIVIL,ZESTRIL) 20 MG tablet TAKE 1 TABLET (20 MG TOTAL) BY MOUTH 2 (TWO) TIMES DAILY. 60 tablet 0  . metFORMIN (GLUCOPHAGE) 500 MG tablet TAKE 2 TABLETS BY MOUTH IN THE MORNING AND TAKE 1 TABLET BY MOUTH AT NIGHT 270 tablet 1  . cetirizine (ZYRTEC) 10 MG tablet Take 10 mg by mouth daily.    Marland Kitchen guaiFENesin-codeine (ROBITUSSIN AC) 100-10 MG/5ML syrup Take 5-10 mLs by mouth at bedtime as needed for cough. 180 mL 0  . oseltamivir (TAMIFLU) 75 MG capsule Take 1 capsule (75 mg total) by mouth 2 (two) times daily. 10 capsule 0   No facility-administered medications prior to visit.      Per HPI unless specifically indicated in ROS section below Review of Systems  Constitutional: Negative  for activity change, appetite change, chills, fatigue, fever and unexpected weight change.  HENT: Negative for hearing loss.   Eyes: Negative for visual disturbance.  Respiratory: Negative for cough, chest tightness, shortness of breath and wheezing.   Cardiovascular: Negative for chest pain, palpitations and leg swelling.  Gastrointestinal: Negative for abdominal distention, abdominal pain, blood in stool, constipation, diarrhea, nausea and vomiting.  Genitourinary: Negative for difficulty urinating and hematuria.  Musculoskeletal: Negative for arthralgias, myalgias and neck pain.  Skin: Negative for  rash.  Neurological: Negative for dizziness, seizures, syncope and headaches.  Hematological: Negative for adenopathy. Does not bruise/bleed easily.  Psychiatric/Behavioral: Negative for dysphoric mood. The patient is not nervous/anxious.        Objective:    BP (!) 142/82   Pulse (!) 56   Temp 98.5 F (36.9 C) (Oral)   Ht 6' 1.25" (1.861 m)   Wt 234 lb 12 oz (106.5 kg)   BMI 30.76 kg/m   Wt Readings from Last 3 Encounters:  07/15/16 234 lb 12 oz (106.5 kg)  05/26/15 248 lb 4 oz (112.6 kg)  05/23/15 249 lb 8 oz (113.2 kg)    Physical Exam  Constitutional: He is oriented to person, place, and time. He appears well-developed and well-nourished. No distress.  HENT:  Head: Normocephalic and atraumatic.  Right Ear: Hearing, tympanic membrane, external ear and ear canal normal.  Left Ear: Hearing, tympanic membrane, external ear and ear canal normal.  Nose: Nose normal.  Mouth/Throat: Uvula is midline, oropharynx is clear and moist and mucous membranes are normal. No oropharyngeal exudate, posterior oropharyngeal edema or posterior oropharyngeal erythema.  Eyes: Conjunctivae and EOM are normal. Pupils are equal, round, and reactive to light. No scleral icterus.  Neck: Normal range of motion. Neck supple. No thyromegaly present.  Cardiovascular: Normal rate, regular rhythm, normal heart sounds and intact distal pulses.   No murmur heard. Pulses:      Radial pulses are 2+ on the right side, and 2+ on the left side.  Pulmonary/Chest: Effort normal and breath sounds normal. No respiratory distress. He has no wheezes. He has no rales.  Abdominal: Soft. Bowel sounds are normal. He exhibits no distension and no mass. There is no tenderness. There is no rebound and no guarding.  Musculoskeletal: Normal range of motion. He exhibits no edema.  See HPI for foot exam if done Mild non inflamed varicosities R lateral leg  Lymphadenopathy:    He has no cervical adenopathy.  Neurological: He is  alert and oriented to person, place, and time.  CN grossly intact, station and gait intact  Skin: Skin is warm and dry. No rash noted.  Psychiatric: He has a normal mood and affect. His behavior is normal. Judgment and thought content normal.  Nursing note and vitals reviewed.  Results for orders placed or performed in visit on 07/10/16  Lipid panel  Result Value Ref Range   Cholesterol 122 0 - 200 mg/dL   Triglycerides 63.0 0.0 - 149.0 mg/dL   HDL 41.80 >39.00 mg/dL   VLDL 12.6 0.0 - 40.0 mg/dL   LDL Cholesterol 68 0 - 99 mg/dL   Total CHOL/HDL Ratio 3    NonHDL 80.30   Hemoglobin A1c  Result Value Ref Range   Hgb A1c MFr Bld 6.1 4.6 - 6.5 %  Basic metabolic panel  Result Value Ref Range   Sodium 139 135 - 145 mEq/L   Potassium 4.2 3.5 - 5.1 mEq/L   Chloride 105 96 - 112  mEq/L   CO2 29 19 - 32 mEq/L   Glucose, Bld 90 70 - 99 mg/dL   BUN 28 (H) 6 - 23 mg/dL   Creatinine, Ser 1.38 0.40 - 1.50 mg/dL   Calcium 10.7 (H) 8.4 - 10.5 mg/dL   GFR 70.60 >60.00 mL/min  VITAMIN D 25 Hydroxy (Vit-D Deficiency, Fractures)  Result Value Ref Range   VITD 43.23 30.00 - 100.00 ng/mL   Lab Results  Component Value Date   PSA 0.94 12/26/2009       Assessment & Plan:   Problem List Items Addressed This Visit    Diabetes type 2, controlled ([place])    Chronic, stable on current regimen and with noted weight loss. May trial metformin 52m BID. Foot exam today. Encouraged he schedule eye exam as overdue.       Relevant Medications   lisinopril (PRINIVIL,ZESTRIL) 20 MG tablet   atorvastatin (LIPITOR) 40 MG tablet   metFORMIN (GLUCOPHAGE) 500 MG tablet   Other Relevant Orders   Hemoglobin A1c   Healthcare maintenance - Primary    Preventative protocols reviewed and updated unless pt declined. Discussed healthy diet and lifestyle.       Hypercalcemia    Mildly elevated today. PTH, TSH WNL 2017. Replacing vit D with 2000 IU daily. RTC 6 mo recheck.       Relevant Orders   PTH, Intact  and Calcium   HYPERCHOLESTEROLEMIA    Chronic, stable. Continue lipitor.       Relevant Medications   lisinopril (PRINIVIL,ZESTRIL) 20 MG tablet   atorvastatin (LIPITOR) 40 MG tablet   HYPERTENSION, BENIGN SYSTEMIC    Chronic, mildly elevated again on recheck. Pt will start monitoring more closely at home and let [place] if persistently above goal for med changes.       Relevant Medications   lisinopril (PRINIVIL,ZESTRIL) 20 MG tablet   atorvastatin (LIPITOR) 40 MG tablet   Obesity, Class I, BMI 30-34.9    Congratulated on weight loss! - reviewed healthy diet and lifestyle changes to continue healthy       Relevant Medications   metFORMIN (GLUCOPHAGE) 500 MG tablet   Varicose vein of leg    Mild, no inflammation. Discussed conservative management with elevation of legs, avoiding salt, good water intake and offered compression stockings - pt will try this and update [place]ith effect.      Relevant Medications   lisinopril (PRINIVIL,ZESTRIL) 20 MG tablet   atorvastatin (LIPITOR) 40 MG tablet   Vitamin D deficiency    Continue vit D supplementation.        Other Visit Diagnoses    Special screening for malignant neoplasm of prostate       Relevant Orders   PSA       Follow up plan: Return in about 1 year (around 07/15/2017) for annual exam, prior fasting for blood work.  JRia Bush MD

## 2016-07-15 NOTE — Assessment & Plan Note (Signed)
Mild, no inflammation. Discussed conservative management with elevation of legs, avoiding salt, good water intake and offered compression stockings - pt will try this and update [place] with effect.

## 2016-07-15 NOTE — Assessment & Plan Note (Signed)
Congratulated on weight loss! - reviewed healthy diet and lifestyle changes to continue healthy

## 2016-07-15 NOTE — Assessment & Plan Note (Signed)
Preventative protocols reviewed and updated unless pt declined. Discussed healthy diet and lifestyle.  

## 2016-11-05 LAB — HM DIABETES EYE EXAM

## 2017-01-14 ENCOUNTER — Other Ambulatory Visit (INDEPENDENT_AMBULATORY_CARE_PROVIDER_SITE_OTHER): Payer: 59

## 2017-01-14 DIAGNOSIS — Z125 Encounter for screening for malignant neoplasm of prostate: Secondary | ICD-10-CM | POA: Diagnosis not present

## 2017-01-14 DIAGNOSIS — E119 Type 2 diabetes mellitus without complications: Secondary | ICD-10-CM

## 2017-01-14 LAB — PSA: PSA: 1.5 ng/mL (ref 0.10–4.00)

## 2017-01-14 LAB — HEMOGLOBIN A1C: HEMOGLOBIN A1C: 6.2 % (ref 4.6–6.5)

## 2017-01-14 NOTE — Addendum Note (Signed)
Addended by: Alvina Chou on: 01/14/2017 07:58 AM   Modules accepted: Orders

## 2017-01-15 LAB — PTH, INTACT AND CALCIUM
Calcium: 10.4 mg/dL — ABNORMAL HIGH (ref 8.6–10.3)
PTH: 88 pg/mL — ABNORMAL HIGH (ref 14–64)

## 2017-01-19 ENCOUNTER — Other Ambulatory Visit: Payer: Self-pay | Admitting: Family Medicine

## 2017-01-30 ENCOUNTER — Telehealth: Payer: Self-pay | Admitting: *Deleted

## 2017-01-30 NOTE — Telephone Encounter (Signed)
Sorry but what is refride?

## 2017-01-30 NOTE — Telephone Encounter (Signed)
Copied from CRM #[id number]. Topic: General - Other >> Jan 30, 2017  8:09 AM Eston Mouldavis, Shahida Schnackenberg B wrote: Reason for CRM: Pt asking to speak with Dr Sharen HonesGutierrez concerning testing he was to be doing but can not because his refride is not working

## 2017-02-11 IMAGING — CR DG LUMBAR SPINE COMPLETE 4+V
5 series · 5 of 5 positions shown · non-contrast
Comparison: None.

CLINICAL DATA: Left-sided radicular symptoms and low back pain

EXAM:
LUMBAR SPINE - COMPLETE 4+ VIEW

[view not recorded (1 of 5)]
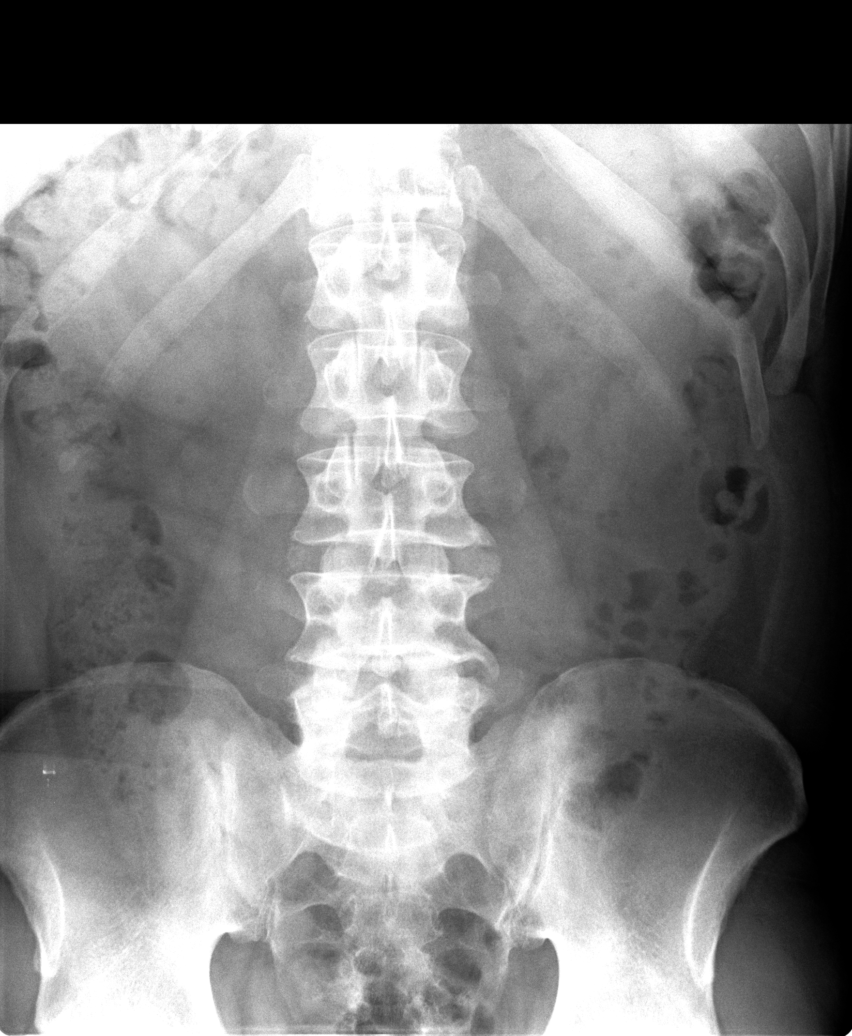

[view not recorded (2 of 5)]
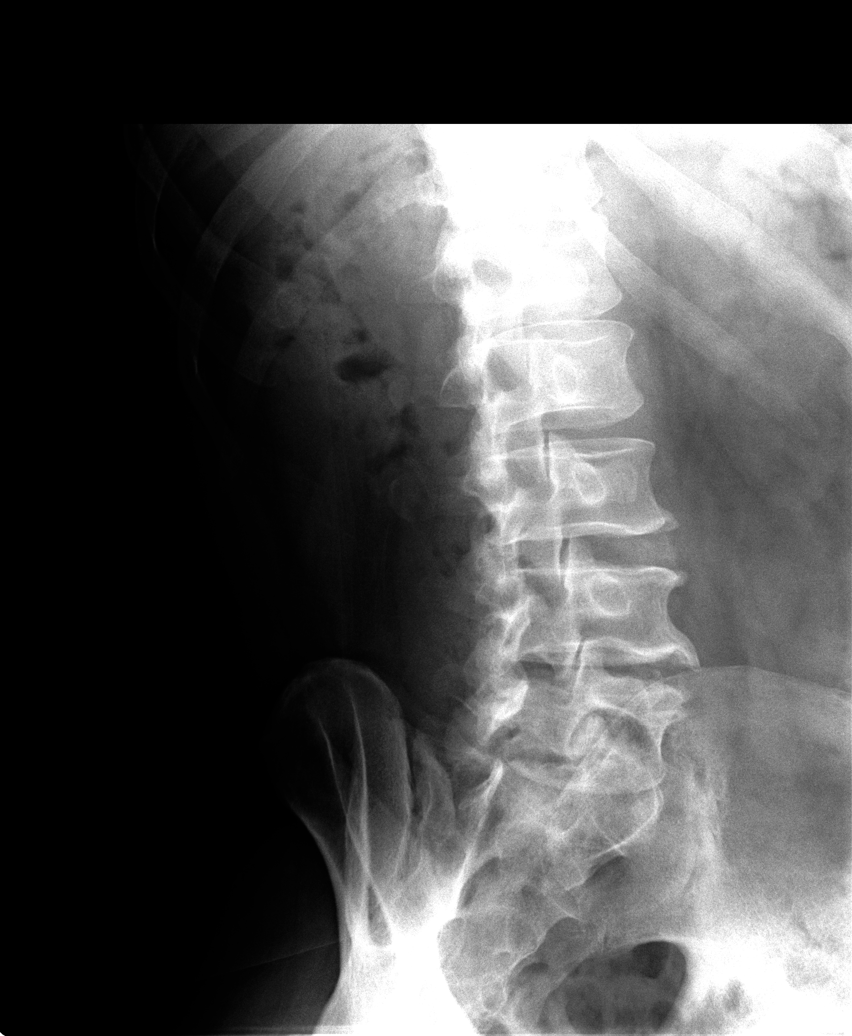

[view not recorded (3 of 5)]
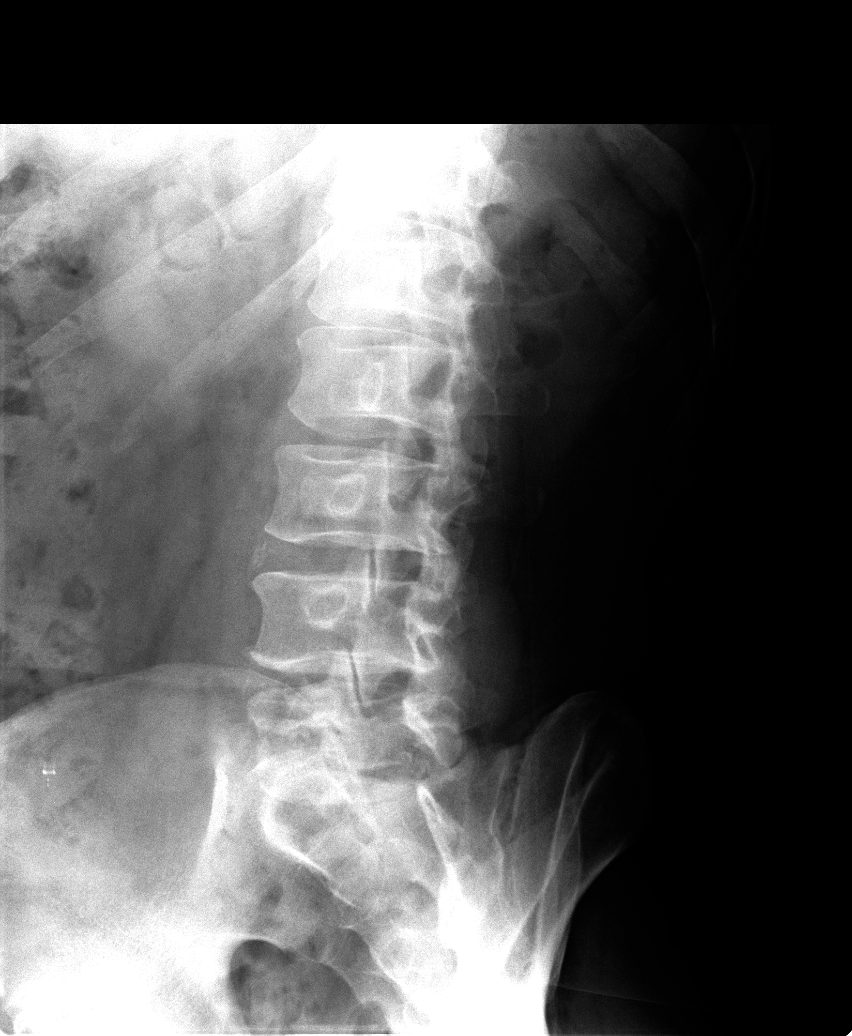

[view not recorded (4 of 5)]
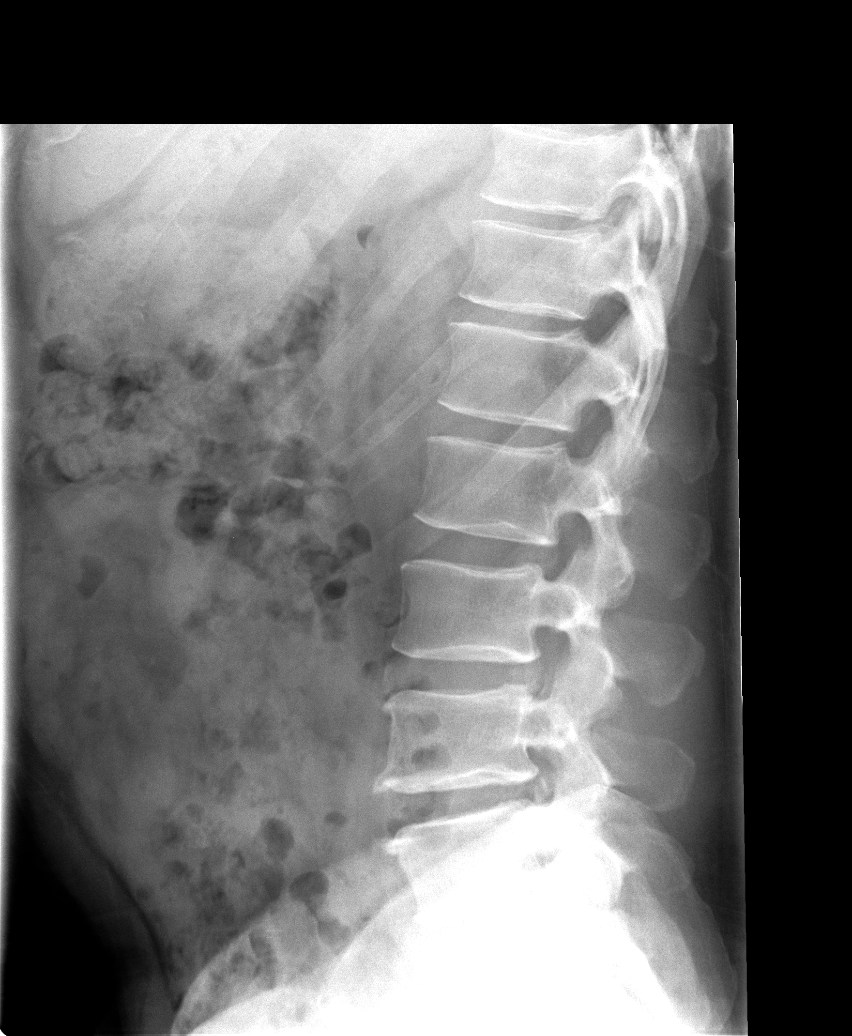

[view not recorded (5 of 5)]
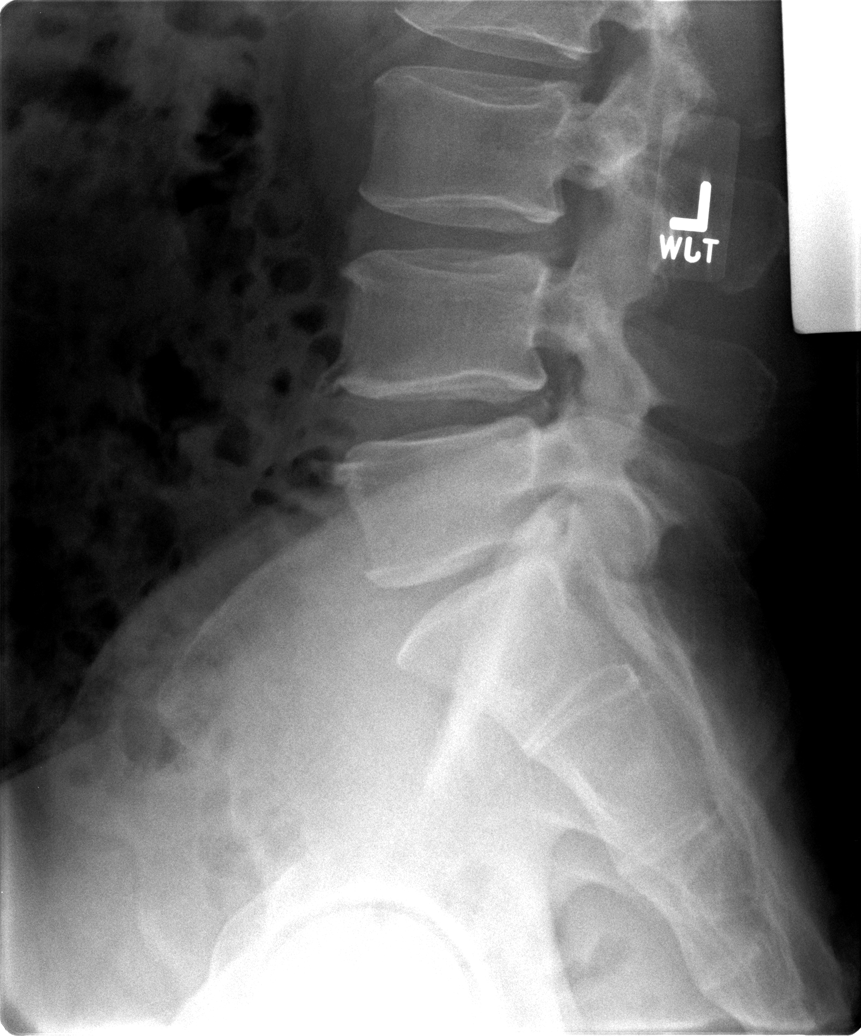

[5 of 5 positions shown; findings below may reference images not displayed]

FINDINGS: Five lumbar type vertebral bodies are well visualized. Vertebral
body height is well maintained. Osteophytic changes are noted
slightly more prominent on the left than the right concentrated
predominately at the L4-5 and L5-S1 interspace. No pars defects are
seen. No spondylolisthesis is noted. No soft tissue changes are
noted.
IMPRESSION: Degenerative changes of lumbar spine.  No acute abnormality is seen.

## 2017-07-01 ENCOUNTER — Ambulatory Visit (INDEPENDENT_AMBULATORY_CARE_PROVIDER_SITE_OTHER): Payer: 59 | Admitting: Family Medicine

## 2017-07-01 ENCOUNTER — Encounter: Payer: Self-pay | Admitting: Family Medicine

## 2017-07-01 VITALS — BP 142/88 | HR 63 | Temp 100.0°F | Wt 243.2 lb

## 2017-07-01 DIAGNOSIS — J111 Influenza due to unidentified influenza virus with other respiratory manifestations: Secondary | ICD-10-CM | POA: Diagnosis not present

## 2017-07-01 MED ORDER — GUAIFENESIN-CODEINE 100-10 MG/5ML PO SYRP
5.0000 mL | ORAL_SOLUTION | Freq: Two times a day (BID) | ORAL | 0 refills | Status: DC | PRN
Start: 2017-07-01 — End: 2017-08-04

## 2017-07-01 MED ORDER — OSELTAMIVIR PHOSPHATE 75 MG PO CAPS: 75.0000 mg | ORAL_CAPSULE | Freq: Two times a day (BID) | ORAL | 0 refills | Status: DC

## 2017-07-01 NOTE — Patient Instructions (Signed)
You do have influenza Ibuprofen 600mg  with meals for inflammation. Take tamiflu sent to pharmacy May use codeine cough syrup for night time Out of work until fever free.  Push fluids and rest.   Influenza, Adult Influenza, more commonly known as "the flu," is a viral infection that primarily affects the respiratory tract. The respiratory tract includes organs that help you breathe, such as the lungs, nose, and throat. The flu causes many common cold symptoms, as well as a high fever and body aches. The flu spreads easily from person to person (is contagious). Getting a flu shot (influenza vaccination) every year is the best way to prevent influenza. What are the causes? Influenza is caused by a virus. You can catch the virus by:  Breathing in droplets from an infected person's cough or sneeze.  Touching something that was recently contaminated with the virus and then touching your mouth, nose, or eyes.  What increases the risk? The following factors may make you more likely to get the flu:  Not cleaning your hands frequently with soap and water or alcohol-based hand sanitizer.  Having close contact with many people during cold and flu season.  Touching your mouth, eyes, or nose without washing or sanitizing your hands first.  Not drinking enough fluids or not eating a healthy diet.  Not getting enough sleep or exercise.  Being under a high amount of stress.  Not getting a yearly (annual) flu shot.  You may be at a higher risk of complications from the flu, such as a severe lung infection (pneumonia), if you:  Are over the age of 50.  Are pregnant.  Have a weakened disease-fighting system (immune system). You may have a weakened immune system if you: ? Have HIV or AIDS. ? Are undergoing chemotherapy. ? Aretaking medicines that reduce the activity of (suppress) the immune system.  Have a long-term (chronic) illness, such as heart disease, kidney disease, diabetes, or lung  disease.  Have a liver disorder.  Are obese.  Have anemia.  What are the signs or symptoms? Symptoms of this condition typically last 4-10 days and may include:  Fever.  Chills.  Headache, body aches, or muscle aches.  Sore throat.  Cough.  Runny or congested nose.  Chest discomfort and cough.  Poor appetite.  Weakness or tiredness (fatigue).  Dizziness.  Nausea or vomiting.  How is this diagnosed? This condition may be diagnosed based on your medical history and a physical exam. Your health care provider may do a nose or throat swab test to confirm the diagnosis. How is this treated? If influenza is detected early, you can be treated with antiviral medicine that can reduce the length of your illness and the severity of your symptoms. This medicine may be given by mouth (orally) or through an IV tube that is inserted in one of your veins. The goal of treatment is to relieve symptoms by taking care of yourself at home. This may include taking over-the-counter medicines, drinking plenty of fluids, and adding humidity to the air in your home. In some cases, influenza goes away on its own. Severe influenza or complications from influenza may be treated in a hospital. Follow these instructions at home:  Take over-the-counter and prescription medicines only as told by your health care provider.  Use a cool mist humidifier to add humidity to the air in your home. This can make breathing easier.  Rest as needed.  Drink enough fluid to keep your urine clear or pale yellow.  Cover your mouth and nose when you cough or sneeze.  Wash your hands with soap and water often, especially after you cough or sneeze. If soap and water are not available, use hand sanitizer.  Stay home from work or school as told by your health care provider. Unless you are visiting your health care provider, try to avoid leaving home until your fever has been gone for 24 hours without the use of  medicine.  Keep all follow-up visits as told by your health care provider. This is important. How is this prevented?  Getting an annual flu shot is the best way to avoid getting the flu. You may get the flu shot in late summer, fall, or winter. Ask your health care provider when you should get your flu shot.  Wash your hands often or use hand sanitizer often.  Avoid contact with people who are sick during cold and flu season.  Eat a healthy diet, drink plenty of fluids, get enough sleep, and exercise regularly. Contact a health care provider if:  You develop new symptoms.  You have: ? Chest pain. ? Diarrhea. ? A fever.  Your cough gets worse.  You produce more mucus.  You feel nauseous or you vomit. Get help right away if:  You develop shortness of breath or difficulty breathing.  Your skin or nails turn a bluish color.  You have severe pain or stiffness in your neck.  You develop a sudden headache or sudden pain in your face or ear.  You cannot stop vomiting. This information is not intended to replace advice given to you by your health care provider. Make sure you discuss any questions you have with your health care provider. Document Released: 03/22/2000 Document Revised: 08/31/2015 Document Reviewed: 01/17/2015 Elsevier Interactive Patient Education  2017 [employer].

## 2017-07-01 NOTE — Progress Notes (Signed)
BP (!) 142/88   Pulse 63   Temp 100 F (37.8 C) (Oral)   Wt 243 lb 4 oz (110.3 kg)   SpO2 97%   BMI 31.87 kg/m    CC: cough Subjective:    Patient ID: Joseph Sos., male    DOB: 1966/02/03, 52 y.o.   MRN: [id number]  HPI: [name] is a 52 y.o. male presenting on 07/01/2017 for Cough   Cough started 2 d ago, productive of yellow and clear mucous. Worse last night. Body aches. Tmax 100.5. Chills, ST, PNdrainage, headache. Chest > head congestion.   No ear or tooth pain, dyspnea or wheezing.   Treating with tylenol and honey and hot chocolate.  Son sick at home - dx with flu yesterday.  No h/o asthma.  Known diabetic. Lab Results  Component Value Date   HGBA1C 6.2 01/14/2017     Relevant past medical, surgical, family and social history reviewed and updated as indicated. Interim medical history since our last visit reviewed. Allergies and medications reviewed and updated. Outpatient Medications Prior to Visit  Medication Sig Dispense Refill  . Ascorbic Acid (VITAMIN C) 1000 MG tablet Take 1,000 mg by mouth daily. Reported on 05/23/2015    . atorvastatin (LIPITOR) 40 MG tablet Take 1 tablet (40 mg total) by mouth daily. 90 tablet 3  . Blood Glucose Monitoring Suppl (ONE TOUCH ULTRA SYSTEM KIT) W/DEVICE KIT 1 kit by Does not apply route once. 1 each 11  . Blood Pressure Monitoring (BLOOD PRESSURE MONITOR/L CUFF) MISC Use to monitor blood pressure as directed. 1 each 0  . cetirizine (ZYRTEC) 10 MG tablet Take 10 mg by mouth daily.    . Cholecalciferol (VITAMIN D3) 2000 units TABS Take 1 tablet by mouth daily.    . fluticasone (FLONASE) 50 MCG/ACT nasal spray Place 2 sprays into both nostrils daily. 16 g 3  . lisinopril (PRINIVIL,ZESTRIL) 20 MG tablet Take 1 tablet (20 mg total) by mouth 2 (two) times daily. 180 tablet 3  . loratadine (CLARITIN) 10 MG tablet Take 10 mg by mouth daily.    . metFORMIN (GLUCOPHAGE) 500 MG tablet Take 1 tablet (500 mg total) by mouth  2 (two) times daily with a meal. 180 tablet 3  . Multiple Vitamins-Minerals (MULTIVITAMIN MEN) TABS Take 1 tablet by mouth daily.    Glory Rosebush DELICA LANCETS 21H MISC 1 each by Does not apply route daily. 250.00 100 each 3   No facility-administered medications prior to visit.      Per HPI unless specifically indicated in ROS section below Review of Systems     Objective:    BP (!) 142/88   Pulse 63   Temp 100 F (37.8 C) (Oral)   Wt 243 lb 4 oz (110.3 kg)   SpO2 97%   BMI 31.87 kg/m   Wt Readings from Last 3 Encounters:  07/01/17 243 lb 4 oz (110.3 kg)  07/15/16 234 lb 12 oz (106.5 kg)  05/26/15 248 lb 4 oz (112.6 kg)    Physical Exam  Constitutional: He appears well-developed and well-nourished. No distress.  HENT:  Head: Normocephalic and atraumatic.  Right Ear: Hearing, tympanic membrane, external ear and ear canal normal.  Left Ear: Hearing, tympanic membrane, external ear and ear canal normal.  Nose: Mucosal edema present. No rhinorrhea. Right sinus exhibits no maxillary sinus tenderness and no frontal sinus tenderness. Left sinus exhibits no maxillary sinus tenderness and no frontal sinus tenderness.  Mouth/Throat: Uvula  is midline, oropharynx is clear and moist and mucous membranes are normal. No oropharyngeal exudate, posterior oropharyngeal edema, posterior oropharyngeal erythema or tonsillar abscesses.  Eyes: Pupils are equal, round, and reactive to light. Conjunctivae and EOM are normal. No scleral icterus.  Neck: Normal range of motion. Neck supple.  Cardiovascular: Normal rate, regular rhythm, normal heart sounds and intact distal pulses.  No murmur heard. Pulmonary/Chest: Effort normal and breath sounds normal. No respiratory distress. He has no decreased breath sounds. He has no wheezes. He has no rhonchi. He has no rales.  Deep cough present  Lymphadenopathy:    He has no cervical adenopathy.  Skin: Skin is warm and dry. No rash noted.  Nursing note and  vitals reviewed.     Assessment & Plan:   Problem List Items Addressed This Visit    Influenza - Primary    Clinical diagnosis. Treat with tamiflu - discussed side effects to monitor for. Cheratussin for cough. Further supportive care as per instructions. Update if not improving with treatment. Pt agrees with plan.       Relevant Medications   oseltamivir (TAMIFLU) 75 MG capsule       Meds ordered this encounter  Medications  . oseltamivir (TAMIFLU) 75 MG capsule    Sig: Take 1 capsule (75 mg total) by mouth 2 (two) times daily.    Dispense:  10 capsule    Refill:  0  . guaiFENesin-codeine (CHERATUSSIN AC) 100-10 MG/5ML syrup    Sig: Take 5 mLs by mouth 2 (two) times daily as needed for cough (sedation precautions).    Dispense:  120 mL    Refill:  0   No orders of the defined types were placed in this encounter.   Follow up plan: Return if symptoms worsen or fail to improve.  [name], MD

## 2017-07-01 NOTE — Assessment & Plan Note (Addendum)
Clinical diagnosis. Treat with tamiflu - discussed side effects to monitor for. Cheratussin for cough. Further supportive care as per instructions. Update if not improving with treatment. Pt agrees with plan.

## 2017-07-11 ENCOUNTER — Encounter: Payer: Self-pay | Admitting: Family Medicine

## 2017-07-13 ENCOUNTER — Other Ambulatory Visit: Payer: Self-pay | Admitting: Family Medicine

## 2017-07-13 DIAGNOSIS — E7800 Pure hypercholesterolemia, unspecified: Secondary | ICD-10-CM

## 2017-07-13 DIAGNOSIS — E119 Type 2 diabetes mellitus without complications: Secondary | ICD-10-CM

## 2017-07-13 DIAGNOSIS — E559 Vitamin D deficiency, unspecified: Secondary | ICD-10-CM

## 2017-07-13 DIAGNOSIS — Z125 Encounter for screening for malignant neoplasm of prostate: Secondary | ICD-10-CM

## 2017-07-14 ENCOUNTER — Other Ambulatory Visit: Payer: 59

## 2017-07-21 ENCOUNTER — Encounter: Payer: 59 | Admitting: Family Medicine

## 2017-07-23 ENCOUNTER — Other Ambulatory Visit: Payer: Self-pay | Admitting: Family Medicine

## 2017-08-01 ENCOUNTER — Other Ambulatory Visit (INDEPENDENT_AMBULATORY_CARE_PROVIDER_SITE_OTHER): Payer: 59

## 2017-08-01 DIAGNOSIS — E119 Type 2 diabetes mellitus without complications: Secondary | ICD-10-CM

## 2017-08-01 DIAGNOSIS — E7800 Pure hypercholesterolemia, unspecified: Secondary | ICD-10-CM

## 2017-08-01 DIAGNOSIS — E559 Vitamin D deficiency, unspecified: Secondary | ICD-10-CM | POA: Diagnosis not present

## 2017-08-01 DIAGNOSIS — Z125 Encounter for screening for malignant neoplasm of prostate: Secondary | ICD-10-CM

## 2017-08-01 LAB — PSA: PSA: 1.53 ng/mL (ref 0.10–4.00)

## 2017-08-01 LAB — COMPREHENSIVE METABOLIC PANEL
ALT: 20 U/L (ref 0–53)
AST: 18 U/L (ref 0–37)
Albumin: 4.1 g/dL (ref 3.5–5.2)
Alkaline Phosphatase: 52 U/L (ref 39–117)
BUN: 26 mg/dL — ABNORMAL HIGH (ref 6–23)
CALCIUM: 9.9 mg/dL (ref 8.4–10.5)
CHLORIDE: 107 meq/L (ref 96–112)
CO2: 28 meq/L (ref 19–32)
Creatinine, Ser: 1.34 mg/dL (ref 0.40–1.50)
GFR: 72.72 mL/min (ref >60.00)
Glucose, Bld: 102 mg/dL — ABNORMAL HIGH (ref 70–99)
POTASSIUM: 4.4 meq/L (ref 3.5–5.1)
Sodium: 139 mEq/L (ref 135–145)
Total Bilirubin: 0.4 mg/dL (ref 0.2–1.2)
Total Protein: 6.8 g/dL (ref 6.0–8.3)

## 2017-08-01 LAB — LIPID PANEL
CHOL/HDL RATIO: 3
Cholesterol: 106 mg/dL (ref 0–200)
HDL: 40 mg/dL (ref >39.00)
LDL CALC: 54 mg/dL (ref 0–99)
NonHDL: 66.14
TRIGLYCERIDES: 60 mg/dL (ref 0.0–149.0)
VLDL: 12 mg/dL (ref 0.0–40.0)

## 2017-08-01 LAB — VITAMIN D 25 HYDROXY (VIT D DEFICIENCY, FRACTURES): VITD: 41.25 ng/mL (ref 30.00–100.00)

## 2017-08-01 LAB — HEMOGLOBIN A1C: Hgb A1c MFr Bld: 6.3 % (ref 4.6–6.5)

## 2017-08-04 ENCOUNTER — Ambulatory Visit (INDEPENDENT_AMBULATORY_CARE_PROVIDER_SITE_OTHER): Payer: 59 | Admitting: Family Medicine

## 2017-08-04 ENCOUNTER — Encounter: Payer: Self-pay | Admitting: Family Medicine

## 2017-08-04 VITALS — BP 160/102 | HR 62 | Temp 97.9°F | Ht 73.0 in | Wt 239.0 lb

## 2017-08-04 DIAGNOSIS — M542 Cervicalgia: Secondary | ICD-10-CM | POA: Insufficient documentation

## 2017-08-04 DIAGNOSIS — E119 Type 2 diabetes mellitus without complications: Secondary | ICD-10-CM

## 2017-08-04 DIAGNOSIS — E7800 Pure hypercholesterolemia, unspecified: Secondary | ICD-10-CM

## 2017-08-04 DIAGNOSIS — Z0001 Encounter for general adult medical examination with abnormal findings: Secondary | ICD-10-CM | POA: Diagnosis not present

## 2017-08-04 DIAGNOSIS — I10 Essential (primary) hypertension: Secondary | ICD-10-CM

## 2017-08-04 DIAGNOSIS — E213 Hyperparathyroidism, unspecified: Secondary | ICD-10-CM | POA: Diagnosis not present

## 2017-08-04 DIAGNOSIS — E669 Obesity, unspecified: Secondary | ICD-10-CM | POA: Diagnosis not present

## 2017-08-04 DIAGNOSIS — E559 Vitamin D deficiency, unspecified: Secondary | ICD-10-CM | POA: Diagnosis not present

## 2017-08-04 LAB — PARATHYROID HORMONE, INTACT (NO CA): PTH: 92 pg/mL — AB (ref 14–64)

## 2017-08-04 MED ORDER — CYCLOBENZAPRINE HCL 10 MG PO TABS: 5.0000 mg | ORAL_TABLET | Freq: Two times a day (BID) | ORAL | 0 refills | Status: DC | PRN

## 2017-08-04 MED ORDER — AMLODIPINE BESYLATE 5 MG PO TABS: 5.0000 mg | ORAL_TABLET | Freq: Every day | ORAL | 3 refills | Status: DC

## 2017-08-04 NOTE — Assessment & Plan Note (Signed)
Preventative protocols reviewed and updated unless pt declined. Discussed healthy diet and lifestyle.  

## 2017-08-04 NOTE — Assessment & Plan Note (Signed)
Continue vit D 2000 IU daily.  

## 2017-08-04 NOTE — Assessment & Plan Note (Signed)
PTH mildly elevated today, calcium level returned to normal. repleting vit D.

## 2017-08-04 NOTE — Progress Notes (Signed)
BP (!) 160/102 (BP Location: Right Leg, Cuff Size: Large)   Pulse 62   Temp 97.9 F (36.6 C) (Oral)   Ht _0  (1.854 m)   Wt 239 lb (108.4 kg)   SpO2 99%   BMI 31.53 kg/m   On repeat, bp 162/108  CC: CPE Subjective:    Patient ID: Joseph Sos., male    DOB: 04/18/1965, 52 y.o.   MRN: [id number]  HPI: Joseph Galloway. is a 52 y.o. male presenting on 08/04/2017 for Annual Exam   Increased stress endorsed over the last week. His 33yo father is sick.  He was rushing to get here as well.  Worsening neck pain recently - since flu. Has new bed. May be pillow. Had a knot that improved with ice. Pain midline lower cervical spine without radiation. No shooting pain down arms, numbness or weakness. Taking ibuprofen 455m once daily for this.   Preventative: Colon cancer screening - not due yet  Fmhx prostate cancer - No nocturia, no changes in stream. agrees to DRE today. PSA next labs  Flu shot yearly Tdap 2013 Pneumovax 2006. Seat belt use discussed. Sunscreen use discussed. No changing moles on skin.  Eye exam yearly  Dentist regularly  Non smoker Alcohol - none  "[name]" Caffeine: rare. 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  Diet: lots of water throughout the day, some fruits/vegetables, watches carbs and starches   Relevant past medical, surgical, family and social history reviewed and updated as indicated. Interim medical history since our last visit reviewed. Allergies and medications reviewed and updated. Outpatient Medications Prior to Visit  Medication Sig Dispense Refill  . atorvastatin (LIPITOR) 40 MG tablet TAKE 1 TABLET (40 MG TOTAL) BY MOUTH DAILY. 90 tablet 3  . Blood Glucose Monitoring Suppl (ONE TOUCH ULTRA SYSTEM KIT) W/DEVICE KIT 1 kit by Does not apply route once. 1 each 11  . Blood Pressure Monitoring  (BLOOD PRESSURE MONITOR/L CUFF) MISC Use to monitor blood pressure as directed. 1 each 0  . cetirizine (ZYRTEC) 10 MG tablet Take 10 mg by mouth daily.    . Cholecalciferol (VITAMIN D3) 2000 units TABS Take 1 tablet by mouth daily.    . fluticasone (FLONASE) 50 MCG/ACT nasal spray Place 2 sprays into both nostrils daily. 16 g 3  . lisinopril (PRINIVIL,ZESTRIL) 20 MG tablet Take 1 tablet (20 mg total) by mouth 2 (two) times daily. 180 tablet 3  . loratadine (CLARITIN) 10 MG tablet Take 10 mg by mouth daily.    . metFORMIN (GLUCOPHAGE) 500 MG tablet TAKE 1 TABLET (500 MG TOTAL) BY MOUTH 2 (TWO) TIMES DAILY WITH A MEAL. 180 tablet 0  . Multiple Vitamins-Minerals (MULTIVITAMIN MEN) TABS Take 1 tablet by mouth daily.    .Glory RosebushDELICA LANCETS 360YMISC 1 each by Does not apply route daily. 250.00 100 each 3  . vitamin C (ASCORBIC ACID) 500 MG tablet Take 500 mg by mouth daily.    . Ascorbic Acid (VITAMIN C) 1000 MG tablet Take 1,000 mg by mouth daily. Reported on 05/23/2015    . guaiFENesin-codeine (CHERATUSSIN AC) 100-10 MG/5ML syrup Take 5 mLs by mouth 2 (two) times daily as needed for cough (sedation precautions). 120 mL 0  . oseltamivir (TAMIFLU) 75 MG capsule Take 1 capsule (75 mg total) by mouth 2 (two) times daily. 10 capsule  0   No facility-administered medications prior to visit.      Per HPI unless specifically indicated in ROS section below Review of Systems  Constitutional: Negative for activity change, appetite change, chills, fatigue, fever and unexpected weight change.  HENT: Negative for hearing loss.   Eyes: Negative for visual disturbance.  Respiratory: Positive for cough (residual). Negative for chest tightness, shortness of breath and wheezing.   Cardiovascular: Negative for chest pain, palpitations and leg swelling.  Gastrointestinal: Negative for abdominal distention, abdominal pain, blood in stool, constipation, diarrhea, nausea and vomiting.  Genitourinary: Negative for  difficulty urinating and hematuria.  Musculoskeletal: Negative for arthralgias, myalgias and neck pain.  Skin: Negative for rash.  Neurological: Negative for dizziness, seizures, syncope and headaches.  Hematological: Negative for adenopathy. Does not bruise/bleed easily.  Psychiatric/Behavioral: Negative for dysphoric mood. The patient is not nervous/anxious.        Objective:    BP (!) 160/102 (BP Location: Right Leg, Cuff Size: Large)   Pulse 62   Temp 97.9 F (36.6 C) (Oral)   Ht _0  (1.854 m)   Wt 239 lb (108.4 kg)   SpO2 99%   BMI 31.53 kg/m   Wt Readings from Last 3 Encounters:  08/04/17 239 lb (108.4 kg)  07/01/17 243 lb 4 oz (110.3 kg)  07/15/16 234 lb 12 oz (106.5 kg)    Physical Exam  Constitutional: He is oriented to person, place, and time. He appears well-developed and well-nourished. No distress.  HENT:  Head: Normocephalic and atraumatic.  Right Ear: Hearing, tympanic membrane, external ear and ear canal normal.  Left Ear: Hearing, tympanic membrane, external ear and ear canal normal.  Nose: Nose normal.  Mouth/Throat: Uvula is midline, oropharynx is clear and moist and mucous membranes are normal. No oropharyngeal exudate, posterior oropharyngeal edema or posterior oropharyngeal erythema.  Eyes: Pupils are equal, round, and reactive to light. Conjunctivae and EOM are normal. No scleral icterus.  Neck: Normal range of motion. Neck supple. No thyromegaly present.  Cardiovascular: Normal rate, regular rhythm, normal heart sounds and intact distal pulses.  No murmur heard. Pulses:      Radial pulses are 2+ on the right side, and 2+ on the left side.  Pulmonary/Chest: Effort normal and breath sounds normal. No respiratory distress. He has no wheezes. He has no rales.  Abdominal: Soft. Bowel sounds are normal. He exhibits no distension and no mass. There is no tenderness. There is no rebound and no guarding.  Genitourinary: Rectum normal and prostate normal.  Rectal exam shows no external hemorrhoid, no internal hemorrhoid, no fissure, no mass, no tenderness and anal tone normal. Prostate is not enlarged and not tender.  Musculoskeletal: Normal range of motion. He exhibits no edema.  Midline spine pain to palpation at lower cervical spine No significant paracervical mm or trap mm tenderness  Lymphadenopathy:    He has no cervical adenopathy.  Neurological: He is alert and oriented to person, place, and time.  CN grossly intact, station and gait intact Neg spurling bilaterally  Skin: Skin is warm and dry. No rash noted.  Psychiatric: He has a normal mood and affect. His behavior is normal. Judgment and thought content normal.  Nursing note and vitals reviewed.  Results for orders placed or performed in visit on 08/01/17  Parathyroid hormone, intact (no Ca)  Result Value Ref Range   PTH 92 (H) 14 - 64 pg/mL  VITAMIN D 25 Hydroxy (Vit-D Deficiency, Fractures)  Result Value Ref Range  VITD 41.25 30.00 - 100.00 ng/mL  PSA  Result Value Ref Range   PSA 1.53 0.10 - 4.00 ng/mL  Hemoglobin A1c  Result Value Ref Range   Hgb A1c MFr Bld 6.3 4.6 - 6.5 %  Comprehensive metabolic panel  Result Value Ref Range   Sodium 139 135 - 145 mEq/L   Potassium 4.4 3.5 - 5.1 mEq/L   Chloride 107 96 - 112 mEq/L   CO2 28 19 - 32 mEq/L   Glucose, Bld 102 (H) 70 - 99 mg/dL   BUN 26 (H) 6 - 23 mg/dL   Creatinine, Ser 1.34 0.40 - 1.50 mg/dL   Total Bilirubin 0.4 0.2 - 1.2 mg/dL   Alkaline Phosphatase 52 39 - 117 U/L   AST 18 0 - 37 U/L   ALT 20 0 - 53 U/L   Total Protein 6.8 6.0 - 8.3 g/dL   Albumin 4.1 3.5 - 5.2 g/dL   Calcium 9.9 8.4 - 10.5 mg/dL   GFR 72.72 >60.00 mL/min  Lipid panel  Result Value Ref Range   Cholesterol 106 0 - 200 mg/dL   Triglycerides 60.0 0.0 - 149.0 mg/dL   HDL 40.00 >39.00 mg/dL   VLDL 12.0 0.0 - 40.0 mg/dL   LDL Cholesterol 54 0 - 99 mg/dL   Total CHOL/HDL Ratio 3    NonHDL 66.14       Assessment & Plan:   Problem List  Items Addressed This Visit    Cervical pain (neck)    Tender midline. Rx flexeril, rec heating pad and gentle stretching. rec try different pillow. No red flags today. If ongoing next visit, will check cervical spine films. Pt agrees with plan.       Diabetes type 2, controlled (HCC)    Chronic, stable. Continue current regimen.       Encounter for general adult medical examination with abnormal findings - Primary    Preventative protocols reviewed and updated unless pt declined. Discussed healthy diet and lifestyle.       HYPERCHOLESTEROLEMIA    Chronic, stable. Continue current regimen. The ASCVD Risk score Mikey Bussing DC Jr., et al., 2013) failed to calculate for the following reasons:   The valid total cholesterol range is 130 to 320 mg/dL       Relevant Medications   amLODipine (NORVASC) 5 MG tablet   Hyperparathyroidism (HCC)    PTH mildly elevated today, calcium level returned to normal. repleting vit D.       HYPERTENSION, BENIGN SYSTEMIC    Chronic, elevated again today.       Relevant Medications   amLODipine (NORVASC) 5 MG tablet   Obesity, Class I, BMI 30-34.9    Discussed healthy diet and lifestyle changes to affect sustainable weight loss.       Vitamin D deficiency    Continue vit D 2000 IU daily.           Meds ordered this encounter  Medications  . amLODipine (NORVASC) 5 MG tablet    Sig: Take 1 tablet (5 mg total) by mouth daily.    Dispense:  90 tablet    Refill:  3  . cyclobenzaprine (FLEXERIL) 10 MG tablet    Sig: Take 0.5-1 tablets (5-10 mg total) by mouth 2 (two) times daily as needed for muscle spasms (sedation precautions).    Dispense:  30 tablet    Refill:  0   No orders of the defined types were placed in this encounter.   Follow up plan:  Return in about 6 weeks (around 09/15/2017) for follow up visit.  [name], MD

## 2017-08-04 NOTE — Assessment & Plan Note (Signed)
Tender midline. Rx flexeril, rec heating pad and gentle stretching. rec try different pillow. No red flags today. If ongoing next visit, will check cervical spine films. Pt agrees with plan.

## 2017-08-04 NOTE — Assessment & Plan Note (Signed)
Chronic, stable. Continue current regimen. 

## 2017-08-04 NOTE — Assessment & Plan Note (Signed)
Chronic, elevated again today.

## 2017-08-04 NOTE — Patient Instructions (Addendum)
Blood pressure is staying elevated. Add amlodipine  to lisinopril  twice daily.  Try muscle relaxant at night, heating pad to the neck. Change bed/pillow.  If no better, let me know.  Good to see you today, blood work looking ok. Return in 6-8 wks for follow up blood pressure.   Health Maintenance, Male A healthy lifestyle and preventive care is important for your health and wellness. Ask your health care provider about what schedule of regular examinations is right for you. What should I know about weight and diet? Eat a Healthy Diet  Eat plenty of vegetables, fruits, whole grains, low-fat dairy products, and lean protein.  Do not eat a lot of foods high in solid fats, added sugars, or salt.  Maintain a Healthy Weight Regular exercise can help you achieve or maintain a healthy weight. You should:  Do at least 150 minutes of exercise each week. The exercise should increase your heart rate and make you sweat (moderate-intensity exercise).  Do strength-training exercises at least twice a week.  Watch Your Levels of Cholesterol and Blood Lipids  Have your blood tested for lipids and cholesterol every 5 years starting at 50 years of age. If you are at high risk for heart disease, you should start having your blood tested when you are 50 years old. You may need to have your cholesterol levels checked more often if: ? Your lipid or cholesterol levels are high. ? You are older than 50 years of age. ? You are at high risk for heart disease.  What should I know about cancer screening? Many types of cancers can be detected early and may often be prevented. Lung Cancer  You should be screened every year for lung cancer if: ? You are a current smoker who has smoked for at least 30 years. ? You are a former smoker who has quit within the past 15 years.  Talk to your health care provider about your screening options, when you should start screening, and how often you should be  screened.  Colorectal Cancer  Routine colorectal cancer screening usually begins at 50 years of age and should be repeated every 5-10 years until you are 50 years old. You may need to be screened more often if early forms of precancerous polyps or small growths are found. Your health care provider may recommend screening at an earlier age if you have risk factors for colon cancer.  Your health care provider may recommend using home test kits to check for hidden blood in the stool.  A small camera at the end of a tube can be used to examine your colon (sigmoidoscopy or colonoscopy). This checks for the earliest forms of colorectal cancer.  Prostate and Testicular Cancer  Depending on your age and overall health, your health care provider may do certain tests to screen for prostate and testicular cancer.  Talk to your health care provider about any symptoms or concerns you have about testicular or prostate cancer.  Skin Cancer  Check your skin from head to toe regularly.  Tell your health care provider about any new moles or changes in moles, especially if: ? There is a change in a mole's size, shape, or color. ? You have a mole that is larger than a pencil eraser.  Always use sunscreen. Apply sunscreen liberally and repeat throughout the day.  Protect yourself by wearing long sleeves, pants, a wide-brimmed hat, and sunglasses when outside.  What should I know about heart disease, diabetes, and  high blood pressure?  If you are 72-64 years of age, have your blood pressure checked every 3-5 years. If you are 57 years of age or older, have your blood pressure checked every year. You should have your blood pressure measured twice-once when you are at a hospital or clinic, and once when you are not at a hospital or clinic. Record the average of the two measurements. To check your blood pressure when you are not at a hospital or clinic, you can use: ? An automated blood pressure machine at a  pharmacy. ? A home blood pressure monitor.  Talk to your health care provider about your target blood pressure.  If you are between 69-27 years old, ask your health care provider if you should take aspirin to prevent heart disease.  Have regular diabetes screenings by checking your fasting blood sugar level. ? If you are at a normal weight and have a low risk for diabetes, have this test once every three years after the age of 51. ? If you are overweight and have a high risk for diabetes, consider being tested at a younger age or more often.  A one-time screening for abdominal aortic aneurysm (AAA) by ultrasound is recommended for men aged 10-75 years who are current or former smokers. What should I know about preventing infection? Hepatitis B If you have a higher risk for hepatitis B, you should be screened for this virus. Talk with your health care provider to find out if you are at risk for hepatitis B infection. Hepatitis C Blood testing is recommended for:  Everyone born from [birth year] through 1963.  Anyone with known risk factors for hepatitis C.  Sexually Transmitted Diseases (STDs)  You should be screened each year for STDs including gonorrhea and chlamydia if: ? You are sexually active and are younger than 50 years of age. ? You are older than 50 years of age and your health care provider tells you that you are at risk for this type of infection. ? Your sexual activity has changed since you were last screened and you are at an increased risk for chlamydia or gonorrhea. Ask your health care provider if you are at risk.  Talk with your health care provider about whether you are at high risk of being infected with HIV. Your health care provider may recommend a prescription medicine to help prevent HIV infection.  What else can I do?  Schedule regular health, dental, and eye exams.  Stay current with your vaccines (immunizations).  Do not use any tobacco products, such as  cigarettes, chewing tobacco, and e-cigarettes. If you need help quitting, ask your health care provider.  Limit alcohol intake to no more than 2 drinks per day. One drink equals 12 ounces of beer, 5 ounces of wine, or 1 ounces of hard liquor.  Do not use street drugs.  Do not share needles.  Ask your health care provider for help if you need support or information about quitting drugs.  Tell your health care provider if you often feel depressed.  Tell your health care provider if you have ever been abused or do not feel safe at home. This information is not intended to replace advice given to you by your health care provider. Make sure you discuss any questions you have with your health care provider. Document Released: 09/21/2007 Document Revised: 11/22/2015 Document Reviewed: 12/27/2014 Elsevier Interactive Patient Education  [employer].

## 2017-08-04 NOTE — Assessment & Plan Note (Signed)
Discussed healthy diet and lifestyle changes to affect sustainable weight loss  

## 2017-08-04 NOTE — Assessment & Plan Note (Signed)
Chronic, stable. Continue current regimen. The ASCVD Risk score (Goff DC Jr., et al., 2013) failed to calculate for the following reasons:   The valid total cholesterol range is 130 to 320 mg/dL  

## 2017-08-10 ENCOUNTER — Encounter: Payer: Self-pay | Admitting: Family Medicine

## 2017-08-15 ENCOUNTER — Other Ambulatory Visit: Payer: Self-pay | Admitting: Family Medicine

## 2017-09-15 ENCOUNTER — Ambulatory Visit (INDEPENDENT_AMBULATORY_CARE_PROVIDER_SITE_OTHER): Payer: 59 | Admitting: Family Medicine

## 2017-09-15 ENCOUNTER — Encounter: Payer: Self-pay | Admitting: Family Medicine

## 2017-09-15 VITALS — BP 138/90 | HR 64 | Temp 98.2°F | Ht 73.0 in | Wt 239.0 lb

## 2017-09-15 DIAGNOSIS — S39012A Strain of muscle, fascia and tendon of lower back, initial encounter: Secondary | ICD-10-CM | POA: Diagnosis not present

## 2017-09-15 DIAGNOSIS — E213 Hyperparathyroidism, unspecified: Secondary | ICD-10-CM | POA: Diagnosis not present

## 2017-09-15 DIAGNOSIS — I10 Essential (primary) hypertension: Secondary | ICD-10-CM

## 2017-09-15 NOTE — Progress Notes (Signed)
BP 138/90 (BP Location: Right Arm, Patient Position: Sitting, Cuff Size: Large)   Pulse 64   Temp 98.2 F (36.8 C) (Oral)   Ht 6' 1"  (1.854 m)   Wt 239 lb (108.4 kg)   SpO2 97%   BMI 31.53 kg/m    CC: 6 wk f/u visit Subjective:    Patient ID: Joseph Sos., male    DOB: [birth year], 52 y.o.   MRN: [id number]  HPI: Joseph Bady. is a 52 y.o. male presenting on 09/15/2017 for Follow-up (Here for 6-8 wk f/u.) and Back Pain (Pain in lower back. Back went out 09/13/17.)   See prior note for details.  Recheck BP improved - compliant with current regimen of lisinopril and amlodipine. Amlodipine was started last visit. Mild ankle swelling, not bothersome. Denies HA, vision changes, CP/tightness, SOB, leg swelling.  Lab Results  Component Value Date   HGBA1C 6.3 08/01/2017    Back went out on Saturday. Back spasms - treating with ibuprofen, heating pad. Describes midline lumbar spasm. Doing gentle stretches.   Elevated parathyroid - no fmhx parathyroid or calcium problems or kidney stones.   Relevant past medical, surgical, family and social history reviewed and updated as indicated. Interim medical history since our last visit reviewed. Allergies and medications reviewed and updated. Outpatient Medications Prior to Visit  Medication Sig Dispense Refill  . amLODipine (NORVASC) 5 MG tablet Take 1 tablet (5 mg total) by mouth daily. 90 tablet 3  . atorvastatin (LIPITOR) 40 MG tablet TAKE 1 TABLET (40 MG TOTAL) BY MOUTH DAILY. 90 tablet 3  . Blood Glucose Monitoring Suppl (ONE TOUCH ULTRA SYSTEM KIT) W/DEVICE KIT 1 kit by Does not apply route once. 1 each 11  . Blood Pressure Monitoring (BLOOD PRESSURE MONITOR/L CUFF) MISC Use to monitor blood pressure as directed. 1 each 0  . cetirizine (ZYRTEC) 10 MG tablet Take 10 mg by mouth daily.    . Cholecalciferol (VITAMIN D3) 2000 units TABS Take 1 tablet by mouth daily.    . cyclobenzaprine (FLEXERIL) 10 MG tablet Take 0.5-1 tablets  (5-10 mg total) by mouth 2 (two) times daily as needed for muscle spasms (sedation precautions). 30 tablet 0  . fluticasone (FLONASE) 50 MCG/ACT nasal spray Place 2 sprays into both nostrils daily. 16 g 3  . lisinopril (PRINIVIL,ZESTRIL) 20 MG tablet TAKE 1 TABLET (20 MG TOTAL) BY MOUTH 2 (TWO) TIMES DAILY. 180 tablet 3  . loratadine (CLARITIN) 10 MG tablet Take 10 mg by mouth daily.    . metFORMIN (GLUCOPHAGE) 500 MG tablet TAKE 1 TABLET (500 MG TOTAL) BY MOUTH 2 (TWO) TIMES DAILY WITH A MEAL. 180 tablet 0  . Multiple Vitamins-Minerals (MULTIVITAMIN MEN) TABS Take 1 tablet by mouth daily.    Glory Rosebush DELICA LANCETS 53I MISC 1 each by Does not apply route daily. 250.00 100 each 3  . vitamin C (ASCORBIC ACID) 500 MG tablet Take 500 mg by mouth daily.     No facility-administered medications prior to visit.      Per HPI unless specifically indicated in ROS section below Review of Systems     Objective:    BP 138/90 (BP Location: Right Arm, Patient Position: Sitting, Cuff Size: Large)   Pulse 64   Temp 98.2 F (36.8 C) (Oral)   Ht 6' 1"  (1.854 m)   Wt 239 lb (108.4 kg)   SpO2 97%   BMI 31.53 kg/m   Wt Readings from Last 3 Encounters:  09/15/17  239 lb (108.4 kg)  08/04/17 239 lb (108.4 kg)  07/01/17 243 lb 4 oz (110.3 kg)    Physical Exam  Constitutional: He appears well-developed and well-nourished. No distress.  HENT:  Mouth/Throat: Oropharynx is clear and moist. No oropharyngeal exudate.  Cardiovascular: Normal rate, regular rhythm and normal heart sounds.  No murmur heard. Pulmonary/Chest: Effort normal and breath sounds normal. No respiratory distress. He has no wheezes. He has no rales.  Musculoskeletal: He exhibits no edema.  Tender midline mid lumbar spine Tender mid lumbar paraspinous mm  Neg SLR bilaterally.  Neurological: He has normal strength. No sensory deficit. Coordination and gait normal.  5/5 strength BLE  Psychiatric: He has a normal mood and affect.    Nursing note and vitals reviewed.  Results for orders placed or performed in visit on 08/01/17  Parathyroid hormone, intact (no Ca)  Result Value Ref Range   PTH 92 (H) 14 - 64 pg/mL  VITAMIN D 25 Hydroxy (Vit-D Deficiency, Fractures)  Result Value Ref Range   VITD 41.25 30.00 - 100.00 ng/mL  PSA  Result Value Ref Range   PSA 1.53 0.10 - 4.00 ng/mL  Hemoglobin A1c  Result Value Ref Range   Hgb A1c MFr Bld 6.3 4.6 - 6.5 %  Comprehensive metabolic panel  Result Value Ref Range   Sodium 139 135 - 145 mEq/L   Potassium 4.4 3.5 - 5.1 mEq/L   Chloride 107 96 - 112 mEq/L   CO2 28 19 - 32 mEq/L   Glucose, Bld 102 (H) 70 - 99 mg/dL   BUN 26 (H) 6 - 23 mg/dL   Creatinine, Ser 1.34 0.40 - 1.50 mg/dL   Total Bilirubin 0.4 0.2 - 1.2 mg/dL   Alkaline Phosphatase 52 39 - 117 U/L   AST 18 0 - 37 U/L   ALT 20 0 - 53 U/L   Total Protein 6.8 6.0 - 8.3 g/dL   Albumin 4.1 3.5 - 5.2 g/dL   Calcium 9.9 8.4 - 10.5 mg/dL   GFR 72.72 >60.00 mL/min  Lipid panel  Result Value Ref Range   Cholesterol 106 0 - 200 mg/dL   Triglycerides 60.0 0.0 - 149.0 mg/dL   HDL 40.00 >39.00 mg/dL   VLDL 12.0 0.0 - 40.0 mg/dL   LDL Cholesterol 54 0 - 99 mg/dL   Total CHOL/HDL Ratio 3    NonHDL 66.14       Assessment & Plan:   Problem List Items Addressed This Visit    Strain of lumbar paraspinal muscle, initial encounter    After stepping awkwardly down into drain at restaurant.  Benign exam. Supportive care reviewed. rec NSAID, muscle relaxant (never picked up last month's Rx), and heating pad with gentle stretches. Anticipate full recovery.      HYPERTENSION, BENIGN SYSTEMIC - Primary    Chronic, improved. Continue current regimen. Minimal ankle edema with amlodipine.       Hyperparathyroidism [facility])    Reviewed with patient as well as possible causes. [place] vs normocalcemic PHPT. He did not return 24 hr urine for calcium collection. Consider updating in future along with ionized calcium level.            No orders of the defined types were placed in this encounter.  No orders of the defined types were placed in this encounter.   Follow up plan: Return in about 5 months (around 02/15/2018) for follow up visit.  [name], MD

## 2017-09-15 NOTE — Assessment & Plan Note (Signed)
Reviewed with patient as well as possible causes. FHH vs normocalcemic PHPT. He did not return 24 hr urine for calcium collection. Consider updating in future along with ionized calcium level.

## 2017-09-15 NOTE — Assessment & Plan Note (Addendum)
After stepping awkwardly down into drain at restaurant.  Benign exam. Supportive care reviewed. rec NSAID, muscle relaxant (never picked up last month's Rx), and heating pad with gentle stretches. Anticipate full recovery.

## 2017-09-15 NOTE — Patient Instructions (Addendum)
For lumbar strain - heating pad, ibuprofen 600mg  with meals for 3-5 days, flexeril muscle relaxant.  Gentle stretching. blood pressures are better - continue amlodipine.  Return in 5-6 months for follow up with labs.

## 2017-09-15 NOTE — Assessment & Plan Note (Signed)
Chronic, improved. Continue current regimen. Minimal ankle edema with amlodipine.

## 2017-10-22 ENCOUNTER — Other Ambulatory Visit: Payer: Self-pay | Admitting: Family Medicine

## 2017-11-06 LAB — HM DIABETES EYE EXAM

## 2017-11-11 LAB — HM DIABETES EYE EXAM

## 2018-02-11 ENCOUNTER — Other Ambulatory Visit: Payer: Self-pay | Admitting: Family Medicine

## 2018-02-11 DIAGNOSIS — E7800 Pure hypercholesterolemia, unspecified: Secondary | ICD-10-CM

## 2018-02-11 DIAGNOSIS — E119 Type 2 diabetes mellitus without complications: Secondary | ICD-10-CM

## 2018-02-11 DIAGNOSIS — E559 Vitamin D deficiency, unspecified: Secondary | ICD-10-CM

## 2018-02-11 DIAGNOSIS — Z125 Encounter for screening for malignant neoplasm of prostate: Secondary | ICD-10-CM

## 2018-02-11 DIAGNOSIS — E213 Hyperparathyroidism, unspecified: Secondary | ICD-10-CM

## 2018-02-13 ENCOUNTER — Other Ambulatory Visit: Payer: 59

## 2018-02-17 ENCOUNTER — Ambulatory Visit: Payer: 59 | Admitting: Family Medicine

## 2018-02-24 ENCOUNTER — Ambulatory Visit: Payer: 59 | Admitting: Family Medicine

## 2018-03-04 ENCOUNTER — Other Ambulatory Visit (INDEPENDENT_AMBULATORY_CARE_PROVIDER_SITE_OTHER): Payer: 59

## 2018-03-04 DIAGNOSIS — Z125 Encounter for screening for malignant neoplasm of prostate: Secondary | ICD-10-CM | POA: Diagnosis not present

## 2018-03-04 DIAGNOSIS — E559 Vitamin D deficiency, unspecified: Secondary | ICD-10-CM

## 2018-03-04 DIAGNOSIS — E7800 Pure hypercholesterolemia, unspecified: Secondary | ICD-10-CM

## 2018-03-04 DIAGNOSIS — E213 Hyperparathyroidism, unspecified: Secondary | ICD-10-CM | POA: Diagnosis not present

## 2018-03-04 DIAGNOSIS — E119 Type 2 diabetes mellitus without complications: Secondary | ICD-10-CM

## 2018-03-04 LAB — COMPREHENSIVE METABOLIC PANEL
ALBUMIN: 4.3 g/dL (ref 3.5–5.2)
ALK PHOS: 45 U/L (ref 39–117)
ALT: 22 U/L (ref 0–53)
AST: 21 U/L (ref 0–37)
BILIRUBIN TOTAL: 0.6 mg/dL (ref 0.2–1.2)
BUN: 27 mg/dL — AB (ref 6–23)
CO2: 29 mEq/L (ref 19–32)
CREATININE: 1.36 mg/dL (ref 0.40–1.50)
Calcium: 10.4 mg/dL (ref 8.4–10.5)
Chloride: 104 mEq/L (ref 96–112)
GFR: 71.31 mL/min (ref >60.00)
GLUCOSE: 101 mg/dL — AB (ref 70–99)
Potassium: 4.5 mEq/L (ref 3.5–5.1)
SODIUM: 138 meq/L (ref 135–145)
TOTAL PROTEIN: 7.1 g/dL (ref 6.0–8.3)

## 2018-03-04 LAB — LIPID PANEL
CHOL/HDL RATIO: 3
Cholesterol: 117 mg/dL (ref 0–200)
HDL: 38.5 mg/dL — AB (ref >39.00)
LDL CALC: 68 mg/dL (ref 0–99)
NonHDL: 78.06
Triglycerides: 50 mg/dL (ref 0.0–149.0)
VLDL: 10 mg/dL (ref 0.0–40.0)

## 2018-03-04 LAB — VITAMIN D 25 HYDROXY (VIT D DEFICIENCY, FRACTURES): VITD: 42.4 ng/mL (ref 30.00–100.00)

## 2018-03-04 LAB — PSA: PSA: 1.34 ng/mL (ref 0.10–4.00)

## 2018-03-04 LAB — HEMOGLOBIN A1C: Hgb A1c MFr Bld: 6.4 % (ref 4.6–6.5)

## 2018-03-04 LAB — TSH: TSH: 1.08 u[IU]/mL (ref 0.35–4.50)

## 2018-03-06 LAB — PARATHYROID HORMONE, INTACT (NO CA): PTH: 90 pg/mL — ABNORMAL HIGH (ref 14–64)

## 2018-03-08 ENCOUNTER — Encounter: Payer: Self-pay | Admitting: Family Medicine

## 2018-03-08 NOTE — Assessment & Plan Note (Signed)
Chronic, stable. Continue atorvastatin  The ASCVD Risk score (Goff DC Jr., et al., 2013) failed to calculate for the following reasons:   The valid total cholesterol range is 130 to 320 mg/dL  

## 2018-03-08 NOTE — Progress Notes (Signed)
BP 140/82 (BP Location: Left Arm, Patient Position: Sitting, Cuff Size: Large)   Pulse (!) 59   Temp 98.2 F (36.8 C) (Oral)   Ht _0  (1.854 m)   Wt 244 lb 8 oz (110.9 kg)   SpO2 99%   BMI 32.26 kg/m    CC: 6 mo f/u visit Subjective:    Patient ID: Joseph Sos., male    DOB: 24-Jun-1965, 52 y.o.   MRN: [id number]  HPI: Joseph Lamarque. is a 52 y.o. male presenting on 03/09/2018 for Follow-up (Here for 5-6 mo f/u.)   Family stress - father sick at home with prostate and bone cancer.  Marland Kitchen  HLD - compliant with atorvastatin, tolerating well without myalgias.  HTN - Compliant with current antihypertensive regimen of amlodipine 103m daily, lisinopril 238mbid. Does not check blood pressures at home. No low blood pressure readings or symptoms of dizziness/syncope. Denies HA, vision changes, CP/tightness, SOB, leg swelling.   DM - does not regularly check sugars. Compliant with antihyperglycemic regimen which includes: metformin 50012mid. Denies low sugars or hypoglycemic symptoms. Denies paresthesias. Last diabetic eye exam 11/2017. Pneumovax: 2006. Prevnar: not due. Glucometer brand: one-touch. DSME: remotely. Lab Results  Component Value Date   HGBA1C 6.4 03/04/2018   Diabetic Foot Exam - Simple   Simple Foot Form Diabetic Foot exam was performed with the following findings:  Yes 03/09/2018  9:21 AM  Visual Inspection No deformities, no ulcerations, no other skin breakdown bilaterally:  Yes Sensation Testing Intact to touch and monofilament testing bilaterally:  Yes Pulse Check Posterior Tibialis and Dorsalis pulse intact bilaterally:  Yes Comments    Lab Results  Component Value Date   MICROALBUR <0.7 11/16/2014     Relevant past medical, surgical, family and social history reviewed and updated as indicated. Interim medical history since our last visit reviewed. Allergies and medications reviewed and updated. Outpatient Medications Prior to Visit  Medication Sig  Dispense Refill  . amLODipine (NORVASC) 5 MG tablet Take 1 tablet (5 mg total) by mouth daily. 90 tablet 3  . atorvastatin (LIPITOR) 40 MG tablet TAKE 1 TABLET (40 MG TOTAL) BY MOUTH DAILY. 90 tablet 3  . Blood Glucose Monitoring Suppl (ONE TOUCH ULTRA SYSTEM KIT) W/DEVICE KIT 1 kit by Does not apply route once. 1 each 11  . Blood Pressure Monitoring (BLOOD PRESSURE MONITOR/L CUFF) MISC Use to monitor blood pressure as directed. 1 each 0  . Cholecalciferol (VITAMIN D3) 2000 units TABS Take 1 tablet by mouth daily.    . cyclobenzaprine (FLEXERIL) 10 MG tablet Take 0.5-1 tablets (5-10 mg total) by mouth 2 (two) times daily as needed for muscle spasms (sedation precautions). 30 tablet 0  . fluticasone (FLONASE) 50 MCG/ACT nasal spray Place 2 sprays into both nostrils daily. 16 g 3  . lisinopril (PRINIVIL,ZESTRIL) 20 MG tablet TAKE 1 TABLET (20 MG TOTAL) BY MOUTH 2 (TWO) TIMES DAILY. 180 tablet 3  . loratadine (CLARITIN) 10 MG tablet Take 10 mg by mouth daily.    . metFORMIN (GLUCOPHAGE) 500 MG tablet TAKE 1 TABLET (500 MG TOTAL) BY MOUTH 2 (TWO) TIMES DAILY WITH A MEAL. 270 tablet 1  . ONETOUCH DELICA LANCETS 33G29USC 1 each by Does not apply route daily. 250.00 100 each 3  . vitamin C (ASCORBIC ACID) 500 MG tablet Take 500 mg by mouth daily.    . cetirizine (ZYRTEC) 10 MG tablet Take 10 mg by mouth daily.    . Multiple  Vitamins-Minerals (MULTIVITAMIN MEN) TABS Take 1 tablet by mouth daily.     No facility-administered medications prior to visit.      Per HPI unless specifically indicated in ROS section below Review of Systems     Objective:    BP 140/82 (BP Location: Left Arm, Patient Position: Sitting, Cuff Size: Large)   Pulse (!) 59   Temp 98.2 F (36.8 C) (Oral)   Ht _0  (1.854 m)   Wt 244 lb 8 oz (110.9 kg)   SpO2 99%   BMI 32.26 kg/m   Wt Readings from Last 3 Encounters:  03/09/18 244 lb 8 oz (110.9 kg)  09/15/17 239 lb (108.4 kg)  08/04/17 239 lb (108.4 kg)    Physical  Exam  Constitutional: He appears well-developed and well-nourished. No distress.  HENT:  Head: Normocephalic and atraumatic.  Right Ear: External ear normal.  Left Ear: External ear normal.  Nose: Nose normal.  Mouth/Throat: Oropharynx is clear and moist. No oropharyngeal exudate.  R cerumen removed from canal with improvement  Eyes: Pupils are equal, round, and reactive to light. Conjunctivae and EOM are normal. No scleral icterus.  Neck: Normal range of motion. Neck supple.  Cardiovascular: Normal rate, regular rhythm, normal heart sounds and intact distal pulses.  No murmur heard. Pulmonary/Chest: Effort normal and breath sounds normal. No respiratory distress. He has no wheezes. He has no rales.  Musculoskeletal: He exhibits no edema.  See HPI for foot exam if done  Lymphadenopathy:    He has no cervical adenopathy.  Skin: Skin is warm and dry. No rash noted.  Psychiatric: He has a normal mood and affect.  Nursing note and vitals reviewed.  Results for orders placed or performed in visit on 03/09/18  HM DIABETES EYE EXAM  Result Value Ref Range   HM Diabetic Eye Exam No Retinopathy No Retinopathy      Assessment & Plan:   Problem List Items Addressed This Visit    Obesity, Class I, BMI 30-34.9    Discussed weight gain noted.       HYPERTENSION, BENIGN SYSTEMIC    Chronic, adequate. Continue current regimen.       Hyperparathyroidism [facility])    Discussed with patient. Anticipate normocalcemic hyperparathyroidism. Discussed baseline DEXA, renal US - will consider ordering at [facility] next year.       HYPERCHOLESTEROLEMIA    Chronic, stable. Continue atorvastatin. The ASCVD Risk score Mikey Bussing DC Jr., et al., 2013) failed to calculate for the following reasons:   The valid total cholesterol range is 130 to 320 mg/dL       Diabetes type 2, controlled (HCC) - Primary    Chronic, stable. Continue current regimen.  Encouraged steady walking regimen        Other Visit  Diagnoses    Need for influenza vaccination       Relevant Orders   Flu Vaccine QUAD 36+ mos IM (Completed)       No orders of the defined types were placed in this encounter.  Orders Placed This Encounter  Procedures  . Flu Vaccine QUAD 36+ mos IM  . [facility] DIABETES EYE EXAM    This external order was created through the Results Console.    Follow up plan: Return in about 6 months (around 09/08/2018) for annual exam, prior fasting for blood work.  [name], MD

## 2018-03-09 ENCOUNTER — Ambulatory Visit (INDEPENDENT_AMBULATORY_CARE_PROVIDER_SITE_OTHER): Payer: 59 | Admitting: Family Medicine

## 2018-03-09 ENCOUNTER — Encounter: Payer: Self-pay | Admitting: Family Medicine

## 2018-03-09 VITALS — BP 140/82 | HR 59 | Temp 98.2°F | Ht 73.0 in | Wt 244.5 lb

## 2018-03-09 DIAGNOSIS — E66811 Obesity, class 1: Secondary | ICD-10-CM

## 2018-03-09 DIAGNOSIS — E7800 Pure hypercholesterolemia, unspecified: Secondary | ICD-10-CM | POA: Diagnosis not present

## 2018-03-09 DIAGNOSIS — I10 Essential (primary) hypertension: Secondary | ICD-10-CM

## 2018-03-09 DIAGNOSIS — E119 Type 2 diabetes mellitus without complications: Secondary | ICD-10-CM

## 2018-03-09 DIAGNOSIS — Z23 Encounter for immunization: Secondary | ICD-10-CM | POA: Diagnosis not present

## 2018-03-09 DIAGNOSIS — E669 Obesity, unspecified: Secondary | ICD-10-CM

## 2018-03-09 DIAGNOSIS — E213 Hyperparathyroidism, unspecified: Secondary | ICD-10-CM

## 2018-03-09 NOTE — Assessment & Plan Note (Signed)
Chronic, adequate. Continue current regimen.  

## 2018-03-09 NOTE — Patient Instructions (Addendum)
Sign release form up front for diabetic eye exam this year.  You are doing well today. Continue current medicines. Return as needed or in 6 months for physical. Try OTC funginail nail laquer daily for the next 6 months.

## 2018-03-09 NOTE — Assessment & Plan Note (Signed)
Discussed with patient. Anticipate normocalcemic hyperparathyroidism. Discussed baseline DEXA, renal US - will consider ordering at [facility] next year.

## 2018-03-09 NOTE — Assessment & Plan Note (Signed)
Chronic, stable. Continue current regimen.  Encouraged steady walking regimen

## 2018-03-09 NOTE — Assessment & Plan Note (Signed)
Discussed weight gain noted.  

## 2018-03-13 ENCOUNTER — Encounter: Payer: Self-pay | Admitting: Family Medicine

## 2018-03-13 DIAGNOSIS — H18602 Keratoconus, unspecified, left eye: Secondary | ICD-10-CM | POA: Insufficient documentation

## 2018-03-17 ENCOUNTER — Encounter: Payer: Self-pay | Admitting: Family Medicine

## 2018-05-22 ENCOUNTER — Ambulatory Visit (INDEPENDENT_AMBULATORY_CARE_PROVIDER_SITE_OTHER): Payer: Commercial Managed Care - PPO | Admitting: Family Medicine

## 2018-05-22 ENCOUNTER — Ambulatory Visit (INDEPENDENT_AMBULATORY_CARE_PROVIDER_SITE_OTHER)
Admission: RE | Admit: 2018-05-22 | Discharge: 2018-05-22 | Disposition: A | Discharge status: Home / Self Care | Payer: Commercial Managed Care - PPO | Source: Ambulatory Visit | Attending: Family Medicine | Admitting: Family Medicine

## 2018-05-22 ENCOUNTER — Encounter: Payer: Self-pay | Admitting: Family Medicine

## 2018-05-22 VITALS — BP 130/94 | HR 70 | Temp 98.7°F | Ht 73.0 in | Wt 248.2 lb

## 2018-05-22 DIAGNOSIS — M2142 Flat foot [pes planus] (acquired), left foot: Secondary | ICD-10-CM

## 2018-05-22 DIAGNOSIS — M2141 Flat foot [pes planus] (acquired), right foot: Secondary | ICD-10-CM | POA: Diagnosis not present

## 2018-05-22 DIAGNOSIS — M25571 Pain in right ankle and joints of right foot: Secondary | ICD-10-CM | POA: Diagnosis not present

## 2018-05-22 DIAGNOSIS — M2140 Flat foot [pes planus] (acquired), unspecified foot: Secondary | ICD-10-CM | POA: Insufficient documentation

## 2018-05-22 MED ORDER — MELOXICAM 15 MG PO TABS: 15.0000 mg | ORAL_TABLET | Freq: Every day | ORAL | 0 refills | Status: DC

## 2018-05-22 NOTE — Progress Notes (Signed)
Subjective:    Patient ID: Joseph Sos., male    DOB: Jul 12, 1965, 53 y.o.   MRN: [id number]  HPI 53 yo pt of Dr Darnell Level here with right foot swelling and pain   Wt Readings from Last 3 Encounters:  05/22/18 248 lb 3 oz (112.6 kg)  03/09/18 244 lb 8 oz (110.9 kg)  09/15/17 239 lb (108.4 kg)   32.74 kg/m    On wed slept on the couch with R foot was pointed  Woke up and noticed that it ached  That night - it swelled  He used ice  Took tylenol   Top of ankle and foot hurt  Not warm  Not red   Possibly recurrent  Not this bad however in the past    Not a lot of purines in diet   Drank cherry juice today (in case of gout) Also increased water   Thinks he has had gout in the past -never diagnosed   Has very bad flat feet Has otc orthotics some of the time  Also leg length discrepency as well    Years ago he had a bad injury -rolling foot /never saw anyone   Patient Active Problem List   Diagnosis Date Noted  . Pes planus 05/22/2018  . Pain in right ankle and joints of right foot 05/22/2018  . 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 Cervical pain (neck) 08/04/2017  . Varicose vein of leg 07/15/2016  . Vitamin D deficiency 05/23/2015  . Hyperparathyroidism ([place]) 05/23/2015  . Strain of lumbar paraspinal muscle, initial encounter 08/15/2014  . Lymphadenopathy of right cervical region 10/26/2012  . GERD (gastroesophageal reflux disease) 10/26/2012  . Allergic rhinitis   . Encounter for general adult medical examination with abnormal findings 07/23/2011  . ONYCHOMYCOSIS, TOENAILS 04/13/2008  . Diabetes type 2, controlled ([place]) 06/05/2006  . HYPERCHOLESTEROLEMIA 06/05/2006  . Obesity, Class I, BMI 30-34.9 06/05/2006  . HYPERTENSION, BENIGN SYSTEMIC 06/05/2006   Past Medical History:  Diagnosis Date  . Allergic rhinitis    Used to receive weekly injections  . Diabetes type 2, controlled ([place])    completed DSME  . HLD (hyperlipidemia)   . HTN (hypertension)   .  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 Onychomycosis    No past surgical history on file. Social History   Tobacco Use  . Smoking status: Former Smoker    Types: Cigars  . Smokeless tobacco: Never Used  . Tobacco comment: Very occasional cigar/had not smoked one in 10 years  Substance Use Topics  . Alcohol use: No    Alcohol/week: 0.0 standard drinks  . Drug use: No   Family History  Problem Relation Age of Onset  . Asthma Son   . Healthy Mother   . Diabetes Maternal Aunt   . Diabetes Sister   . Hypertension Unknown        family history  . Cancer Father 52       prostate  . Cancer Paternal Uncle        prostate  . Kidney disease Paternal Uncle        ESRD  . Deep vein thrombosis Paternal Uncle   . Stroke Neg Hx   . CAD Neg Hx    Allergies  Allergen Reactions  . Hctz [Hydrochlorothiazide] Other (See Comments)    hypercalcemia   Current Outpatient Medications on File Prior to Visit  Medication Sig  Dispense Refill  . amLODipine (NORVASC) 5 MG tablet Take 1 tablet (5 mg total) by mouth daily. 90 tablet 3  . atorvastatin (LIPITOR) 40 MG tablet TAKE 1 TABLET (40 MG TOTAL) BY MOUTH DAILY. 90 tablet 3  . Blood Glucose Monitoring Suppl (ONE TOUCH ULTRA SYSTEM KIT) W/DEVICE KIT 1 kit by Does not apply route once. 1 each 11  . Blood Pressure Monitoring (BLOOD PRESSURE MONITOR/L CUFF) MISC Use to monitor blood pressure as directed. 1 each 0  . cetirizine (ZYRTEC) 10 MG tablet Take 10 mg by mouth daily.    . Cholecalciferol (VITAMIN D3) 2000 units TABS Take 1 tablet by mouth daily.    . cyclobenzaprine (FLEXERIL) 10 MG tablet Take 0.5-1 tablets (5-10 mg total) by mouth 2 (two) times daily as needed for muscle spasms (sedation precautions). 30 tablet 0  . fluticasone (FLONASE) 50 MCG/ACT nasal spray Place 2 sprays into both nostrils daily. 16 g 3  . lisinopril (PRINIVIL,ZESTRIL) 20 MG tablet TAKE 1 TABLET (20  MG TOTAL) BY MOUTH 2 (TWO) TIMES DAILY. 180 tablet 3  . metFORMIN (GLUCOPHAGE) 500 MG tablet TAKE 1 TABLET (500 MG TOTAL) BY MOUTH 2 (TWO) TIMES DAILY WITH A MEAL. 270 tablet 1  . Multiple Vitamins-Minerals (MULTIVITAMIN MEN) TABS Take 1 tablet by mouth daily.    Glory Rosebush DELICA LANCETS 54S MISC 1 each by Does not apply route daily. 250.00 100 each 3  . vitamin C (ASCORBIC ACID) 500 MG tablet Take 500 mg by mouth daily.    Marland Kitchen loratadine (CLARITIN) 10 MG tablet Take 10 mg by mouth daily.     No current facility-administered medications on file prior to visit.     Review of Systems  Constitutional: Negative for activity change, appetite change, fatigue, fever and unexpected weight change.  HENT: Negative for congestion, rhinorrhea, sore throat and trouble swallowing.   Eyes: Negative for pain, redness, itching and visual disturbance.  Respiratory: Negative for cough, chest tightness, shortness of breath and wheezing.   Cardiovascular: Negative for chest pain and palpitations.  Gastrointestinal: Negative for abdominal pain, blood in stool, constipation, diarrhea and nausea.  Endocrine: Negative for cold intolerance, heat intolerance, polydipsia and polyuria.  Genitourinary: Negative for difficulty urinating, dysuria, frequency and urgency.  Musculoskeletal: Positive for arthralgias and joint swelling. Negative for back pain and myalgias.       R foot and ankle pain with swelling but no redness  Skin: Negative for pallor and rash.  Neurological: Negative for dizziness, tremors, weakness, numbness and headaches.  Hematological: Negative for adenopathy. Does not bruise/bleed easily.  Psychiatric/Behavioral: Negative for decreased concentration and dysphoric mood. The patient is not nervous/anxious.        Objective:   Physical Exam Constitutional:      General: He is not in acute distress.    Appearance: Normal appearance.  HENT:     Mouth/Throat:     Mouth: Mucous membranes are moist.       Pharynx: Oropharynx is clear.  Eyes:     Conjunctiva/sclera: Conjunctivae normal.     Pupils: Pupils are equal, round, and reactive to light.  Cardiovascular:     Rate and Rhythm: Normal rate and regular rhythm.     Heart sounds: Normal heart sounds.  Pulmonary:     Effort: Pulmonary effort is normal. No respiratory distress.     Breath sounds: Normal breath sounds. No wheezing or rales.  Musculoskeletal: Normal range of motion.        General: Swelling and  tenderness present. No deformity.     Right ankle: He exhibits swelling. He exhibits normal range of motion, no ecchymosis, no deformity and normal pulse. Tenderness. Lateral malleolus, AITFL and posterior TFL tenderness found. No head of 5th metatarsal and no proximal fibula tenderness found. Achilles tendon normal.     Right lower leg: No edema.     Left lower leg: No edema.     Right foot: Normal range of motion and normal capillary refill. Tenderness, bony tenderness and swelling present. No crepitus or deformity.     Comments: Mod to severe pes planus -worse on R R first MTP joint is enlarged  Tender over medial proximal foot (bony and soft tissue)  Tender over lateral ankle  No crepitus  Nl rom with some pain on full dorsiflexion and invertion of ankle   Gait favors L foot   No skin change /redness or warmth  Skin:    General: Skin is warm and dry.     Coloration: Skin is not pale.     Findings: No erythema or rash.  Neurological:     General: No focal deficit present.     Mental Status: He is alert.     Sensory: No sensory deficit.     Motor: No weakness.  Psychiatric:        Mood and Affect: Mood normal.           Assessment & Plan:   Problem List Items Addressed This Visit      Other   Pes planus   Pain in right ankle and joints of right foot - Primary    R foot and ankle swelling- ? If recurrent but worse than usual  Exam does not point to gout Severe pes planus noted  Tender over top of foot  and around lateral malleolus   Xray today to r/o stress fx and arthritic change meloxicam px  Enc elevate/ice (compresion only if helpful)  Further plan from there       Relevant Orders   DG Ankle Complete Right (Completed)   DG Foot Complete Right (Completed)

## 2018-05-22 NOTE — Patient Instructions (Addendum)
Xray of foot and ankle today  We will get back to you with a report and plan   Elevate your foot when you can  Use a cold compress/ice for 10 minutes as often as you can  Take the meloxicam as directed for pain and inflammation   Wear your orthotics as much as you can   If symptoms change or suddenly worse please call

## 2018-05-24 NOTE — Assessment & Plan Note (Signed)
R foot and ankle swelling- ? If recurrent but worse than usual  Exam does not point to gout Severe pes planus noted  Tender over top of foot and around lateral malleolus   Xray today to r/o stress fx and arthritic change meloxicam px  Enc elevate/ice (compresion only if helpful)  Further plan from there

## 2018-05-25 ENCOUNTER — Telehealth: Payer: Self-pay | Admitting: *Deleted

## 2018-05-25 NOTE — Telephone Encounter (Signed)
Left VM requesting pt to call the office back regarding xray results 

## 2018-05-25 NOTE — Telephone Encounter (Signed)
Best number [phone] Returned your call

## 2018-05-25 NOTE — Telephone Encounter (Signed)
Addressed in result notes  

## 2018-06-14 ENCOUNTER — Other Ambulatory Visit: Payer: Self-pay | Admitting: Family Medicine

## 2018-06-15 NOTE — Telephone Encounter (Signed)
Last office visit 05/22/2018 with Dr. Milinda Antis for ankle pain.  Last refilled 05/22/2018 for #30 with no refills.  Next Appt: 09/22/2018 for CPE.

## 2018-07-15 ENCOUNTER — Other Ambulatory Visit: Payer: Self-pay | Admitting: Family Medicine

## 2018-07-23 ENCOUNTER — Other Ambulatory Visit: Payer: Self-pay | Admitting: Family Medicine

## 2018-08-15 ENCOUNTER — Other Ambulatory Visit: Payer: Self-pay | Admitting: Family Medicine

## 2018-09-10 ENCOUNTER — Telehealth: Payer: Self-pay

## 2018-09-10 NOTE — Telephone Encounter (Signed)
Left detailed VM w COVID screen and curbside info  Also asked pt to arrive at 8am

## 2018-09-13 ENCOUNTER — Other Ambulatory Visit: Payer: Self-pay | Admitting: Family Medicine

## 2018-09-13 DIAGNOSIS — E559 Vitamin D deficiency, unspecified: Secondary | ICD-10-CM

## 2018-09-13 DIAGNOSIS — Z125 Encounter for screening for malignant neoplasm of prostate: Secondary | ICD-10-CM

## 2018-09-13 DIAGNOSIS — E119 Type 2 diabetes mellitus without complications: Secondary | ICD-10-CM

## 2018-09-13 DIAGNOSIS — E213 Hyperparathyroidism, unspecified: Secondary | ICD-10-CM

## 2018-09-13 DIAGNOSIS — E7800 Pure hypercholesterolemia, unspecified: Secondary | ICD-10-CM

## 2018-09-14 ENCOUNTER — Other Ambulatory Visit: Payer: 59

## 2018-09-22 ENCOUNTER — Encounter: Payer: 59 | Admitting: Family Medicine

## 2018-10-18 ENCOUNTER — Other Ambulatory Visit: Payer: Self-pay | Admitting: Family Medicine

## 2018-10-22 ENCOUNTER — Telehealth: Payer: Self-pay

## 2018-10-22 NOTE — Telephone Encounter (Signed)
Left voicemail for pt to call us back regarding prescriptions and his appointment times

## 2018-10-22 NOTE — Telephone Encounter (Signed)
[facility] - Client Nonclinical Telephone Record [facility] [facility] - Client Client Site [facility] Physician [name] - MD Contact Type Call Who Is Calling Patient / Member / Family / Caregiver Caller Name Lashan Gluth Caller Phone Number [phone] Patient Name Joseph Galloway Patient DOB 1965/06/18 Call Type Message Only Information Provided Reason for Call Request to Schedule Office Appointment Initial Comment Caller's prescriptions have not been approved and he needs to check his appt time. Additional Comment Call Closed By: Renato Shin Transaction Date/Time: 10/21/2018 5:05:42 PM (ET)

## 2018-10-23 ENCOUNTER — Telehealth: Payer: Self-pay

## 2018-10-23 NOTE — Telephone Encounter (Signed)
Left detailed VM w COVID screen and back door lab info   

## 2018-10-27 ENCOUNTER — Other Ambulatory Visit: Payer: Self-pay | Admitting: Family Medicine

## 2018-10-27 ENCOUNTER — Other Ambulatory Visit (INDEPENDENT_AMBULATORY_CARE_PROVIDER_SITE_OTHER): Payer: No Typology Code available for payment source

## 2018-10-27 ENCOUNTER — Other Ambulatory Visit: Payer: Self-pay

## 2018-10-27 DIAGNOSIS — Z125 Encounter for screening for malignant neoplasm of prostate: Secondary | ICD-10-CM | POA: Diagnosis not present

## 2018-10-27 DIAGNOSIS — E7800 Pure hypercholesterolemia, unspecified: Secondary | ICD-10-CM | POA: Diagnosis not present

## 2018-10-27 DIAGNOSIS — E213 Hyperparathyroidism, unspecified: Secondary | ICD-10-CM | POA: Diagnosis not present

## 2018-10-27 DIAGNOSIS — E559 Vitamin D deficiency, unspecified: Secondary | ICD-10-CM

## 2018-10-27 DIAGNOSIS — E119 Type 2 diabetes mellitus without complications: Secondary | ICD-10-CM | POA: Diagnosis not present

## 2018-10-27 LAB — COMPREHENSIVE METABOLIC PANEL
ALT: 20 U/L (ref 0–53)
AST: 16 U/L (ref 0–37)
Albumin: 4.2 g/dL (ref 3.5–5.2)
Alkaline Phosphatase: 48 U/L (ref 39–117)
BUN: 31 mg/dL — ABNORMAL HIGH (ref 6–23)
CO2: 27 mEq/L (ref 19–32)
Calcium: 10.3 mg/dL (ref 8.4–10.5)
Chloride: 104 mEq/L (ref 96–112)
Creatinine, Ser: 1.41 mg/dL (ref 0.40–1.50)
GFR: 64.19 mL/min (ref >60.00)
Glucose, Bld: 92 mg/dL (ref 70–99)
Potassium: 4.2 mEq/L (ref 3.5–5.1)
Sodium: 139 mEq/L (ref 135–145)
Total Bilirubin: 0.6 mg/dL (ref 0.2–1.2)
Total Protein: 6.6 g/dL (ref 6.0–8.3)

## 2018-10-27 LAB — LIPID PANEL
Cholesterol: 123 mg/dL (ref 0–200)
HDL: 38.2 mg/dL — ABNORMAL LOW (ref >39.00)
LDL Cholesterol: 74 mg/dL (ref 0–99)
NonHDL: 84.77
Total CHOL/HDL Ratio: 3
Triglycerides: 55 mg/dL (ref 0.0–149.0)
VLDL: 11 mg/dL (ref 0.0–40.0)

## 2018-10-27 LAB — VITAMIN D 25 HYDROXY (VIT D DEFICIENCY, FRACTURES): VITD: 43.68 ng/mL (ref 30.00–100.00)

## 2018-10-27 LAB — HEMOGLOBIN A1C: Hgb A1c MFr Bld: 6.5 % (ref 4.6–6.5)

## 2018-10-27 LAB — PSA: PSA: 1.44 ng/mL (ref 0.10–4.00)

## 2018-10-28 LAB — PARATHYROID HORMONE, INTACT (NO CA): PTH: 68 pg/mL — ABNORMAL HIGH (ref 14–64)

## 2018-11-03 ENCOUNTER — Ambulatory Visit (INDEPENDENT_AMBULATORY_CARE_PROVIDER_SITE_OTHER): Payer: No Typology Code available for payment source | Admitting: Family Medicine

## 2018-11-03 ENCOUNTER — Other Ambulatory Visit: Payer: Self-pay

## 2018-11-03 ENCOUNTER — Encounter: Payer: Self-pay | Admitting: Family Medicine

## 2018-11-03 VITALS — BP 130/86 | HR 67 | Temp 97.9°F | Ht 73.0 in | Wt 238.5 lb

## 2018-11-03 DIAGNOSIS — E7800 Pure hypercholesterolemia, unspecified: Secondary | ICD-10-CM | POA: Diagnosis not present

## 2018-11-03 DIAGNOSIS — E118 Type 2 diabetes mellitus with unspecified complications: Secondary | ICD-10-CM

## 2018-11-03 DIAGNOSIS — Z0000 Encounter for general adult medical examination without abnormal findings: Secondary | ICD-10-CM

## 2018-11-03 DIAGNOSIS — E213 Hyperparathyroidism, unspecified: Secondary | ICD-10-CM

## 2018-11-03 DIAGNOSIS — E559 Vitamin D deficiency, unspecified: Secondary | ICD-10-CM

## 2018-11-03 DIAGNOSIS — I10 Essential (primary) hypertension: Secondary | ICD-10-CM

## 2018-11-03 DIAGNOSIS — E66811 Obesity, class 1: Secondary | ICD-10-CM

## 2018-11-03 DIAGNOSIS — E669 Obesity, unspecified: Secondary | ICD-10-CM

## 2018-11-03 DIAGNOSIS — Z1211 Encounter for screening for malignant neoplasm of colon: Secondary | ICD-10-CM | POA: Diagnosis not present

## 2018-11-03 MED ORDER — AMLODIPINE BESYLATE 5 MG PO TABS: 5.0000 mg | ORAL_TABLET | Freq: Every day | ORAL | 3 refills | Status: DC

## 2018-11-03 MED ORDER — GLUCOSE BLOOD VI STRP: ORAL_STRIP | 3 refills | Status: DC

## 2018-11-03 MED ORDER — METFORMIN HCL 500 MG PO TABS: 500.0000 mg | ORAL_TABLET | Freq: Two times a day (BID) | ORAL | 3 refills | Status: DC

## 2018-11-03 MED ORDER — ONETOUCH DELICA LANCETS 33G MISC: 1.0000 | Freq: Every day | 3 refills | Status: DC

## 2018-11-03 MED ORDER — ONETOUCH ULTRALINK W/DEVICE KIT: PACK | 0 refills | Status: DC

## 2018-11-03 MED ORDER — ATORVASTATIN CALCIUM 40 MG PO TABS: 40.0000 mg | ORAL_TABLET | Freq: Every day | ORAL | 3 refills | Status: DC

## 2018-11-03 MED ORDER — LISINOPRIL 20 MG PO TABS: 20.0000 mg | ORAL_TABLET | Freq: Two times a day (BID) | ORAL | 3 refills | Status: DC

## 2018-11-03 NOTE — Assessment & Plan Note (Signed)
Continue 2000 IU daily.  

## 2018-11-03 NOTE — Assessment & Plan Note (Signed)
Stable period.  

## 2018-11-03 NOTE — Patient Instructions (Addendum)
We will refer you for colonoscopy - let them know you'd prefer 04/2019.  Work on [employer] diet and exercise routine Return in 6 months for diabetes follow up visit.   Health Maintenance, Male Adopting a healthy lifestyle and getting preventive care are important in promoting health and wellness. Ask your health care provider about:  The right schedule for you to have regular tests and exams.  Things you can do on your own to prevent diseases and keep yourself healthy. What should I know about diet, weight, and exercise? Eat a healthy diet   Eat a diet that includes plenty of vegetables, fruits, low-fat dairy products, and lean protein.  Do not eat a lot of foods that are high in solid fats, added sugars, or sodium. Maintain a healthy weight Body mass index (BMI) is a measurement that can be used to identify possible weight problems. It estimates body fat based on height and weight. Your health care provider can help determine your BMI and help you achieve or maintain a healthy weight. Get regular exercise Get regular exercise. This is one of the most important things you can do for your health. Most adults should:  Exercise for at least 150 minutes each week. The exercise should increase your heart rate and make you sweat (moderate-intensity exercise).  Do strengthening exercises at least twice a week. This is in addition to the moderate-intensity exercise.  Spend less time sitting. Even light physical activity can be beneficial. Watch cholesterol and blood lipids Have your blood tested for lipids and cholesterol at 51 years of age, then have this test every 5 years. You may need to have your cholesterol levels checked more often if:  Your lipid or cholesterol levels are high.  You are older than 51 years of age.  You are at high risk for heart disease. What should I know about cancer screening? Many types of cancers can be detected early and may often be prevented. Depending on  your health history and family history, you may need to have cancer screening at various ages. This may include screening for:  Colorectal cancer.  Prostate cancer.  Skin cancer.  Lung cancer. What should I know about heart disease, diabetes, and high blood pressure? Blood pressure and heart disease  High blood pressure causes heart disease and increases the risk of stroke. This is more likely to develop in people who have high blood pressure readings, are of African descent, or are overweight.  Talk with your health care provider about your target blood pressure readings.  Have your blood pressure checked: ? Every 3-5 years if you are 6818-51 years of age. ? Every year if you are 51 years old or older.  If you are between the ages of 6365 and 2375 and are a current or former smoker, ask your health care provider if you should have a one-time screening for abdominal aortic aneurysm (AAA). Diabetes Have regular diabetes screenings. This checks your fasting blood sugar level. Have the screening done:  Once every three years after age 51 if you are at a normal weight and have a low risk for diabetes.  More often and at a younger age if you are overweight or have a high risk for diabetes. What should I know about preventing infection? Hepatitis B If you have a higher risk for hepatitis B, you should be screened for this virus. Talk with your health care provider to find out if you are at risk for hepatitis B infection. Hepatitis C  Blood testing is recommended for:  Everyone born from [birth year] through 1963.  Anyone with known risk factors for hepatitis C. Sexually transmitted infections (STIs)  You should be screened each year for STIs, including gonorrhea and chlamydia, if: ? You are sexually active and are younger than 51 years of age. ? You are older than 51 years of age and your health care provider tells you that you are at risk for this type of infection. ? Your sexual activity has  changed since you were last screened, and you are at increased risk for chlamydia or gonorrhea. Ask your health care provider if you are at risk.  Ask your health care provider about whether you are at high risk for HIV. Your health care provider may recommend a prescription medicine to help prevent HIV infection. If you choose to take medicine to prevent HIV, you should first get tested for HIV. You should then be tested every 3 months for as long as you are taking the medicine. Follow these instructions at home: Lifestyle  Do not use any products that contain nicotine or tobacco, such as cigarettes, e-cigarettes, and chewing tobacco. If you need help quitting, ask your health care provider.  Do not use street drugs.  Do not share needles.  Ask your health care provider for help if you need support or information about quitting drugs. Alcohol use  Do not drink alcohol if your health care provider tells you not to drink.  If you drink alcohol: ? Limit how much you have to 0-2 drinks a day. ? Be aware of how much alcohol is in your drink. In the [place] one drink equals one 12 oz bottle of beer (355 mL), one 5 oz glass of wine (148 mL), or one 1 oz glass of hard liquor (44 mL). General instructions  Schedule regular health, dental, and eye exams.  Stay current with your vaccines.  Tell your health care provider if: ? You often feel depressed. ? You have ever been abused or do not feel safe at home. Summary  Adopting a healthy lifestyle and getting preventive care are important in promoting health and wellness.  Follow your health care provider's instructions about healthy diet, exercising, and getting tested or screened for diseases.  Follow your health care provider's instructions on monitoring your cholesterol and blood pressure. This information is not intended to replace advice given to you by your health care provider. Make sure you discuss any questions you have with your  health care provider. Document Released: 09/21/2007 Document Revised: 03/18/2018 Document Reviewed: 03/18/2018 Elsevier Patient Education  2020 [employer].

## 2018-11-03 NOTE — Assessment & Plan Note (Signed)
Chronic stable. Continue atorvastatin. The ASCVD Risk score Mikey Bussing DC Jr., et al., 2013) failed to calculate for the following reasons:   The valid total cholesterol range is 130 to 320 mg/dL

## 2018-11-03 NOTE — Assessment & Plan Note (Signed)
Chronic, stable. Continue metformin. Foot exam today. 

## 2018-11-03 NOTE — Progress Notes (Signed)
This visit was conducted in person.  BP 130/86 (BP Location: Right Arm, Patient Position: Sitting, Cuff Size: Large)   Pulse 67   Temp 97.9 F (36.6 C) (Temporal)   Ht 6' 1"  (1.854 m)   Wt 238 lb 8 oz (108.2 kg)   SpO2 97%   BMI 31.47 kg/m    CC: CPE Subjective:    Patient ID: Joseph Sos., male    DOB: [birth year], 53 y.o.   MRN: [id number]  HPI: Joseph Conery. is a 53 y.o. male presenting on 11/03/2018 for Annual Exam   Preventative: Colon cancer screening - discussed options, would like colonoscopy.  Fmhx prostate cancer - No nocturia, no changes in stream. Agrees to DRE today. PSA next labs Flu shot yearly Tdap 2013 Pneumovax 2006.  Seat belt use discussed. Sunscreen use discussed.No changing moles on skin.  Non smoker Alcohol - none Dentist 4 times a year  Eye exam yearly   "Joseph Galloway" Caffeine: rare. 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  Diet: lots of water throughout the day, some fruits/vegetables,watches carbs and starches     Relevant past medical, surgical, family and social history reviewed and updated as indicated. Interim medical history since our last visit reviewed. Allergies and medications reviewed and updated. Outpatient Medications Prior to Visit  Medication Sig Dispense Refill  . Blood Pressure Monitoring (BLOOD PRESSURE MONITOR/L CUFF) MISC Use to monitor blood pressure as directed. 1 each 0  . cetirizine (ZYRTEC) 10 MG tablet Take 10 mg by mouth daily.    . Cholecalciferol (VITAMIN D3) 2000 units TABS Take 1 tablet by mouth daily.    . cyclobenzaprine (FLEXERIL) 10 MG tablet Take 0.5-1 tablets (5-10 mg total) by mouth 2 (two) times daily as needed for muscle spasms (sedation precautions). 30 tablet 0  . fluticasone (FLONASE) 50 MCG/ACT nasal spray Place 2 sprays into both nostrils daily. 16  g 3  . meloxicam (MOBIC) 15 MG tablet TAKE 1 TABLET BY MOUTH EVERY DAY WITH FOOD 30 tablet 0  . Multiple Vitamins-Minerals (CENTRUM MULTIGUMMIES PO) Take by mouth. 50+    . amLODipine (NORVASC) 5 MG tablet TAKE 1 TABLET BY MOUTH EVERY DAY 30 tablet 2  . atorvastatin (LIPITOR) 40 MG tablet TAKE 1 TABLET BY MOUTH EVERY DAY 30 tablet 0  . Blood Glucose Monitoring Suppl (ONE TOUCH ULTRA SYSTEM KIT) W/DEVICE KIT 1 kit by Does not apply route once. 1 each 11  . lisinopril (ZESTRIL) 20 MG tablet TAKE 1 TABLET BY MOUTH TWICE A DAY 60 tablet 0  . metFORMIN (GLUCOPHAGE) 500 MG tablet TAKE 1 TABLET (500 MG TOTAL) BY MOUTH 2 (TWO) TIMES DAILY WITH A MEAL. 60 tablet 2  . ONETOUCH DELICA LANCETS 76H MISC 1 each by Does not apply route daily. 250.00 100 each 3  . loratadine (CLARITIN) 10 MG tablet Take 10 mg by mouth daily. Takes during winter    . vitamin C (ASCORBIC ACID) 500 MG tablet Take 500 mg by mouth daily.    . Multiple Vitamins-Minerals (MULTIVITAMIN MEN) TABS Take 1 tablet by mouth daily.     No facility-administered medications prior to visit.      Per HPI unless specifically indicated in ROS section below Review of Systems  Constitutional: Negative for activity change, appetite change, chills, fatigue, fever and unexpected weight change.  HENT: Negative for hearing loss.  Eyes: Negative for visual disturbance.  Respiratory: Negative for cough, chest tightness, shortness of breath and wheezing.   Cardiovascular: Negative for chest pain, palpitations and leg swelling.  Gastrointestinal: Negative for abdominal distention, abdominal pain, blood in stool, constipation, diarrhea, nausea and vomiting.  Genitourinary: Negative for difficulty urinating and hematuria.  Musculoskeletal: Negative for arthralgias, myalgias and neck pain.  Skin: Negative for rash.  Neurological: Negative for dizziness, seizures, syncope and headaches.  Hematological: Negative for adenopathy. Does not bruise/bleed  easily.  Psychiatric/Behavioral: Negative for dysphoric mood. The patient is not nervous/anxious.    Objective:    BP 130/86 (BP Location: Right Arm, Patient Position: Sitting, Cuff Size: Large)   Pulse 67   Temp 97.9 F (36.6 C) (Temporal)   Ht 6' 1"  (1.854 m)   Wt 238 lb 8 oz (108.2 kg)   SpO2 97%   BMI 31.47 kg/m   Wt Readings from Last 3 Encounters:  11/03/18 238 lb 8 oz (108.2 kg)  05/22/18 248 lb 3 oz (112.6 kg)  03/09/18 244 lb 8 oz (110.9 kg)    Physical Exam Vitals signs and nursing note reviewed.  Constitutional:      General: Joseph Galloway is not in acute distress.    Appearance: Normal appearance. Joseph Galloway is well-developed. Joseph Galloway is not ill-appearing.  HENT:     Head: Normocephalic and atraumatic.     Right Ear: Hearing, tympanic membrane, ear canal and external ear normal.     Left Ear: Hearing, tympanic membrane, ear canal and external ear normal.     Nose: Nose normal.     Mouth/Throat:     Mouth: Mucous membranes are moist.     Pharynx: Uvula midline. No oropharyngeal exudate or posterior oropharyngeal erythema.  Eyes:     General: No scleral icterus.    Extraocular Movements: Extraocular movements intact.     Conjunctiva/sclera: Conjunctivae normal.     Pupils: Pupils are equal, round, and reactive to light.  Neck:     Musculoskeletal: Normal range of motion and neck supple.  Cardiovascular:     Rate and Rhythm: Normal rate and regular rhythm.     Pulses: Normal pulses.          Radial pulses are 2+ on the right side and 2+ on the left side.     Heart sounds: Normal heart sounds. No murmur.  Pulmonary:     Effort: Pulmonary effort is normal. No respiratory distress.     Breath sounds: Normal breath sounds. No wheezing, rhonchi or rales.  Abdominal:     General: Abdomen is flat. Bowel sounds are normal. There is no distension.     Palpations: Abdomen is soft. There is no mass.     Tenderness: There is no abdominal tenderness. There is no guarding or rebound.      Hernia: No hernia is present.  Genitourinary:    Prostate: Normal. Not enlarged (15gm), not tender and no nodules present.     Rectum: Normal. No mass, tenderness, anal fissure, external hemorrhoid or internal hemorrhoid. Normal anal tone.  Musculoskeletal: Normal range of motion.  Lymphadenopathy:     Cervical: No cervical adenopathy.  Skin:    General: Skin is warm and dry.     Findings: No rash.  Neurological:     General: No focal deficit present.     Mental Status: Joseph Galloway is alert and oriented to person, place, and time.     Comments: CN grossly intact, station and gait intact  Psychiatric:  Mood and Affect: Mood normal.        Behavior: Behavior normal.        Thought Content: Thought content normal.        Judgment: Judgment normal.       Results for orders placed or performed in visit on 10/27/18  Parathyroid hormone, intact (no Ca)  Result Value Ref Range   PTH 68 (H) 14 - 64 pg/mL  VITAMIN D 25 Hydroxy (Vit-D Deficiency, Fractures)  Result Value Ref Range   VITD 43.68 30.00 - 100.00 ng/mL  PSA  Result Value Ref Range   PSA 1.44 0.10 - 4.00 ng/mL  Hemoglobin A1c  Result Value Ref Range   Hgb A1c MFr Bld 6.5 4.6 - 6.5 %  Comprehensive metabolic panel  Result Value Ref Range   Sodium 139 135 - 145 mEq/L   Potassium 4.2 3.5 - 5.1 mEq/L   Chloride 104 96 - 112 mEq/L   CO2 27 19 - 32 mEq/L   Glucose, Bld 92 70 - 99 mg/dL   BUN 31 (H) 6 - 23 mg/dL   Creatinine, Ser 1.41 0.40 - 1.50 mg/dL   Total Bilirubin 0.6 0.2 - 1.2 mg/dL   Alkaline Phosphatase 48 39 - 117 U/L   AST 16 0 - 37 U/L   ALT 20 0 - 53 U/L   Total Protein 6.6 6.0 - 8.3 g/dL   Albumin 4.2 3.5 - 5.2 g/dL   Calcium 10.3 8.4 - 10.5 mg/dL   GFR 64.19 >60.00 mL/min  Lipid panel  Result Value Ref Range   Cholesterol 123 0 - 200 mg/dL   Triglycerides 55.0 0.0 - 149.0 mg/dL   HDL 38.20 (L) >39.00 mg/dL   VLDL 11.0 0.0 - 40.0 mg/dL   LDL Cholesterol 74 0 - 99 mg/dL   Total CHOL/HDL Ratio 3    NonHDL  84.77    Assessment & Plan:   Problem List Items Addressed This Visit    Vitamin D deficiency    Continue 2000 IU daily.       Obesity, Class I, BMI 30-34.9    Encouraged ongoing efforts at healthy diet and lifestyle changes to affect sustainable weight loss.       HYPERTENSION, BENIGN SYSTEMIC    Chronic, stable. Continue current regimen.       Relevant Medications   amLODipine (NORVASC) 5 MG tablet   atorvastatin (LIPITOR) 40 MG tablet   lisinopril (ZESTRIL) 20 MG tablet   Hyperparathyroidism (HCC)    Stable period.       HYPERCHOLESTEROLEMIA    Chronic stable. Continue atorvastatin. The ASCVD Risk score Mikey Bussing DC Jr., et al., 2013) failed to calculate for the following reasons:   The valid total cholesterol range is 130 to 320 mg/dL       Relevant Medications   amLODipine (NORVASC) 5 MG tablet   atorvastatin (LIPITOR) 40 MG tablet   lisinopril (ZESTRIL) 20 MG tablet   Health maintenance examination - Primary    Preventative protocols reviewed and updated unless pt declined. Discussed healthy diet and lifestyle.       Controlled diabetes mellitus type 2 with complications (HCC)    Chronic, stable. Continue metformin. Foot exam today.       Relevant Medications   atorvastatin (LIPITOR) 40 MG tablet   lisinopril (ZESTRIL) 20 MG tablet   metFORMIN (GLUCOPHAGE) 500 MG tablet    Other Visit Diagnoses    Special screening for malignant neoplasms, colon  Relevant Orders   Ambulatory referral to Gastroenterology       Meds ordered this encounter  Medications  . amLODipine (NORVASC) 5 MG tablet    Sig: Take 1 tablet (5 mg total) by mouth daily.    Dispense:  90 tablet    Refill:  3  . atorvastatin (LIPITOR) 40 MG tablet    Sig: Take 1 tablet (40 mg total) by mouth daily.    Dispense:  90 tablet    Refill:  3  . lisinopril (ZESTRIL) 20 MG tablet    Sig: Take 1 tablet (20 mg total) by mouth 2 (two) times daily.    Dispense:  180 tablet    Refill:  3  .  metFORMIN (GLUCOPHAGE) 500 MG tablet    Sig: Take 1 tablet (500 mg total) by mouth 2 (two) times daily with a meal.    Dispense:  180 tablet    Refill:  3  . OneTouch Delica Lancets 06C MISC    Sig: 1 each by Does not apply route daily. 250.00    Dispense:  100 each    Refill:  3  . Blood Glucose Monitoring Suppl (ONETOUCH ULTRALINK) w/Device KIT    Sig: Use as directed    Dispense:  1 kit    Refill:  0    Formulate based on pt/insurance preference  . glucose blood test strip    Sig: Use as instructed to check sugars daily and as needed    Dispense:  100 each    Refill:  3    Formulate based on patient preference   Orders Placed This Encounter  Procedures  . Ambulatory referral to Gastroenterology    Referral Priority:   Routine    Referral Type:   Consultation    Referral Reason:   Specialty Services Required    Number of Visits Requested:   1    Follow up plan: Return in about 6 months (around 05/06/2019) for follow up visit.  [name], MD

## 2018-11-03 NOTE — Assessment & Plan Note (Signed)
Chronic, stable. Continue current regimen. 

## 2018-11-03 NOTE — Assessment & Plan Note (Signed)
Encouraged ongoing efforts at healthy diet and lifestyle changes to affect sustainable weight loss.  

## 2018-11-03 NOTE — Assessment & Plan Note (Signed)
Preventative protocols reviewed and updated unless pt declined. Discussed healthy diet and lifestyle.  

## 2019-02-02 ENCOUNTER — Ambulatory Visit (INDEPENDENT_AMBULATORY_CARE_PROVIDER_SITE_OTHER): Payer: No Typology Code available for payment source

## 2019-02-02 DIAGNOSIS — Z23 Encounter for immunization: Secondary | ICD-10-CM | POA: Diagnosis not present

## 2019-05-03 ENCOUNTER — Ambulatory Visit: Payer: No Typology Code available for payment source | Admitting: Family Medicine

## 2019-07-17 ENCOUNTER — Other Ambulatory Visit: Payer: Self-pay | Admitting: Family Medicine

## 2019-07-17 DIAGNOSIS — E118 Type 2 diabetes mellitus with unspecified complications: Secondary | ICD-10-CM

## 2019-07-19 ENCOUNTER — Other Ambulatory Visit (INDEPENDENT_AMBULATORY_CARE_PROVIDER_SITE_OTHER): Payer: No Typology Code available for payment source

## 2019-07-19 ENCOUNTER — Other Ambulatory Visit: Payer: Self-pay

## 2019-07-19 DIAGNOSIS — E118 Type 2 diabetes mellitus with unspecified complications: Secondary | ICD-10-CM

## 2019-07-19 LAB — POCT GLYCOSYLATED HEMOGLOBIN (HGB A1C): Hemoglobin A1C: 5.8 % — AB (ref 4.0–5.6)

## 2019-07-26 ENCOUNTER — Telehealth: Payer: Self-pay | Admitting: Family Medicine

## 2019-07-26 ENCOUNTER — Ambulatory Visit (INDEPENDENT_AMBULATORY_CARE_PROVIDER_SITE_OTHER): Payer: No Typology Code available for payment source | Admitting: Family Medicine

## 2019-07-26 ENCOUNTER — Encounter: Payer: Self-pay | Admitting: Family Medicine

## 2019-07-26 ENCOUNTER — Other Ambulatory Visit: Payer: Self-pay

## 2019-07-26 VITALS — BP 132/82 | HR 55 | Temp 97.1°F | Ht 73.0 in | Wt 244.0 lb

## 2019-07-26 DIAGNOSIS — E559 Vitamin D deficiency, unspecified: Secondary | ICD-10-CM

## 2019-07-26 DIAGNOSIS — I10 Essential (primary) hypertension: Secondary | ICD-10-CM | POA: Diagnosis not present

## 2019-07-26 DIAGNOSIS — E118 Type 2 diabetes mellitus with unspecified complications: Secondary | ICD-10-CM

## 2019-07-26 MED ORDER — ONETOUCH ULTRA MINI W/DEVICE KIT: 1.0000 | PACK | 0 refills | Status: DC | PRN

## 2019-07-26 MED ORDER — VITAMIN C 1000 MG PO TABS: 1000.0000 mg | ORAL_TABLET | Freq: Every day | ORAL | Status: DC

## 2019-07-26 NOTE — Patient Instructions (Addendum)
You are doing well today Return as needed or after July 28th for physical. Good to se you today.  Try lotrimin for skin maceration on left.

## 2019-07-26 NOTE — Assessment & Plan Note (Signed)
Chronic, stable. Continue current regimen. 

## 2019-07-26 NOTE — Assessment & Plan Note (Signed)
Chronic, stable. Continue current regimen of metformin. Foot exam today.

## 2019-07-26 NOTE — Progress Notes (Signed)
This visit was conducted in person.  BP 132/82 (BP Location: Left Arm, Patient Position: Sitting, Cuff Size: Normal)   Pulse (!) 55   Temp (!) 97.1 F (36.2 C) (Temporal)   Ht 6' 1"  (1.854 m)   Wt 244 lb (110.7 kg)   SpO2 98%   BMI 32.19 kg/m    CC: DM f/u visit  Subjective:    Patient ID: Joseph Sos., male    DOB: [birth year], 54 y.o.   MRN: [id number]  HPI: Joseph Galloway. is a 54 y.o. male presenting on 07/26/2019 for Follow-up (DM follow up )   DM - does not regularly check sugars - meter broke. Compliant with antihyperglycemic regimen which includes: metformin 520m bid. Denies low sugars or hypoglycemic symptoms. Denies paresthesias. Last diabetic eye exam DUE. Pneumovax: 2006. Prevnar: not due. Glucometer brand: one-touch. DSME: completed. Lab Results  Component Value Date   HGBA1C 5.8 (A) 07/19/2019   Diabetic Foot Exam - Simple   Simple Foot Form Diabetic Foot exam was performed with the following findings: Yes 07/26/2019  8:21 AM  Visual Inspection See comments: Yes Sensation Testing Intact to touch and monofilament testing bilaterally: Yes Pulse Check Posterior Tibialis and Dorsalis pulse intact bilaterally: Yes Comments Slight maceration between L 4/5 digits    Lab Results  Component Value Date   MICROALBUR <0.7 11/16/2014  Also tolerating lipitor well.   HTN - Compliant with current antihypertensive regimen of amlodipine 562mdaily, lisinopril 2036mid.  Does not check blood pressures at home. No low blood pressure readings or symptoms of dizziness/syncope. Denies HA, vision changes, CP/tightness, SOB, leg swelling.    He's stopped vit D, was taking 2000 IU daily      Relevant past medical, surgical, family and social history reviewed and updated as indicated. Interim medical history since our last visit reviewed. Allergies and medications reviewed and updated. Outpatient Medications Prior to Visit  Medication Sig Dispense Refill  .  amLODipine (NORVASC) 5 MG tablet Take 1 tablet (5 mg total) by mouth daily. 90 tablet 3  . atorvastatin (LIPITOR) 40 MG tablet Take 1 tablet (40 mg total) by mouth daily. 90 tablet 3  . Blood Pressure Monitoring (BLOOD PRESSURE MONITOR/L CUFF) MISC Use to monitor blood pressure as directed. 1 each 0  . cetirizine (ZYRTEC) 10 MG tablet Take 10 mg by mouth daily.    . cyclobenzaprine (FLEXERIL) 10 MG tablet Take 0.5-1 tablets (5-10 mg total) by mouth 2 (two) times daily as needed for muscle spasms (sedation precautions). 30 tablet 0  . fluticasone (FLONASE) 50 MCG/ACT nasal spray Place 2 sprays into both nostrils daily. 16 g 3  . glucose blood test strip Use as instructed to check sugars daily and as needed 100 each 3  . lisinopril (ZESTRIL) 20 MG tablet Take 1 tablet (20 mg total) by mouth 2 (two) times daily. 180 tablet 3  . loratadine (CLARITIN) 10 MG tablet Take 10 mg by mouth daily. Takes during winter    . meloxicam (MOBIC) 15 MG tablet TAKE 1 TABLET BY MOUTH EVERY DAY WITH FOOD 30 tablet 0  . metFORMIN (GLUCOPHAGE) 500 MG tablet Take 1 tablet (500 mg total) by mouth 2 (two) times daily with a meal. 180 tablet 3  . Multiple Vitamins-Minerals (CENTRUM MULTIGUMMIES PO) Take by mouth. 50+    . OneTouch Delica Lancets [id number] 1 each by Does not apply route daily. 250.00 100 each 3  . Blood Glucose Monitoring Suppl (ONETOUCH ULTRALINK)  w/Device KIT Use as directed 1 kit 0  . vitamin C (ASCORBIC ACID) 500 MG tablet Take 500 mg by mouth daily.    . Cholecalciferol (VITAMIN D3) 2000 units TABS Take 1 tablet by mouth daily.     No facility-administered medications prior to visit.     Per HPI unless specifically indicated in ROS section below Review of Systems Objective:    BP 132/82 (BP Location: Left Arm, Patient Position: Sitting, Cuff Size: Normal)   Pulse (!) 55   Temp (!) 97.1 F (36.2 C) (Temporal)   Ht 6' 1"  (1.854 m)   Wt 244 lb (110.7 kg)   SpO2 98%   BMI 32.19 kg/m   Wt  Readings from Last 3 Encounters:  07/26/19 244 lb (110.7 kg)  11/03/18 238 lb 8 oz (108.2 kg)  05/22/18 248 lb 3 oz (112.6 kg)    Physical Exam Vitals and nursing note reviewed.  Constitutional:      General: He is not in acute distress.    Appearance: Normal appearance. He is well-developed. He is not ill-appearing.  Cardiovascular:     Rate and Rhythm: Normal rate and regular rhythm.     Pulses: Normal pulses.     Heart sounds: Normal heart sounds. No murmur.  Pulmonary:     Effort: Pulmonary effort is normal. No respiratory distress.     Breath sounds: Normal breath sounds. No wheezing, rhonchi or rales.  Musculoskeletal:     Cervical back: Normal range of motion and neck supple.     Right lower leg: No edema.     Left lower leg: No edema.     Comments: See HPI for foot exam if done  Lymphadenopathy:     Cervical: No cervical adenopathy.  Skin:    General: Skin is warm and dry.     Findings: No rash.  Neurological:     Mental Status: He is alert.  Psychiatric:        Mood and Affect: Mood normal.        Behavior: Behavior normal.       Results for orders placed or performed in visit on 07/19/19  POCT glycosylated hemoglobin (Hb A1C)  Result Value Ref Range   Hemoglobin A1C 5.8 (A) 4.0 - 5.6 %   HbA1c POC (<> result, manual entry)     HbA1c, POC (prediabetic range)     HbA1c, POC (controlled diabetic range)     Assessment & Plan:  This visit occurred during the SARS-CoV-2 public health emergency.  Safety protocols were in place, including screening questions prior to the visit, additional usage of staff PPE, and extensive cleaning of exam room while observing appropriate contact time as indicated for disinfecting solutions.   Problem List Items Addressed This Visit    Vitamin D deficiency    He has stopped vit D. Will check levels at next blood work then determine need for ongoing replacement.       HYPERTENSION, BENIGN SYSTEMIC    Chronic, stable. Continue  current regimen.       Controlled diabetes mellitus type 2 with complications (HCC)    Chronic, stable. Continue current regimen of metformin. Foot exam today.           Meds ordered this encounter  Medications  . Blood Glucose Monitoring Suppl (ONE TOUCH ULTRA MINI) w/Device KIT    Sig: 1 kit by Does not apply route as needed.    Dispense:  1 kit    Refill:  0  . Ascorbic Acid (VITAMIN C) 1000 MG tablet    Sig: Take 1 tablet (1,000 mg total) by mouth daily.    Dispense:      No orders of the defined types were placed in this encounter.   Follow up plan: Return in about 3 months (around 10/25/2019) for annual exam, prior fasting for blood work.  [name], MD

## 2019-07-26 NOTE — Telephone Encounter (Signed)
Noted. Pharmacy have been updated

## 2019-07-26 NOTE — Telephone Encounter (Signed)
The patient would like to switch his pharmacy from [facility] on [address].

## 2019-07-26 NOTE — Assessment & Plan Note (Addendum)
He has stopped vit D. Will check levels at next blood work then determine need for ongoing replacement.

## 2019-08-30 ENCOUNTER — Other Ambulatory Visit: Payer: Self-pay

## 2019-08-30 ENCOUNTER — Ambulatory Visit (INDEPENDENT_AMBULATORY_CARE_PROVIDER_SITE_OTHER)
Admission: RE | Admit: 2019-08-30 | Discharge: 2019-08-30 | Disposition: A | Discharge status: Home / Self Care | Payer: No Typology Code available for payment source | Source: Ambulatory Visit | Attending: Family Medicine | Admitting: Family Medicine

## 2019-08-30 ENCOUNTER — Encounter: Payer: Self-pay | Admitting: Family Medicine

## 2019-08-30 ENCOUNTER — Ambulatory Visit (INDEPENDENT_AMBULATORY_CARE_PROVIDER_SITE_OTHER): Payer: No Typology Code available for payment source | Admitting: Family Medicine

## 2019-08-30 ENCOUNTER — Other Ambulatory Visit: Payer: Self-pay | Admitting: Family Medicine

## 2019-08-30 VITALS — BP 120/80 | HR 96 | Temp 97.9°F | Ht 73.0 in | Wt 237.0 lb

## 2019-08-30 DIAGNOSIS — M2142 Flat foot [pes planus] (acquired), left foot: Secondary | ICD-10-CM | POA: Diagnosis not present

## 2019-08-30 DIAGNOSIS — M79672 Pain in left foot: Secondary | ICD-10-CM

## 2019-08-30 DIAGNOSIS — M79671 Pain in right foot: Secondary | ICD-10-CM

## 2019-08-30 DIAGNOSIS — M2141 Flat foot [pes planus] (acquired), right foot: Secondary | ICD-10-CM | POA: Diagnosis not present

## 2019-08-30 LAB — CBC WITH DIFFERENTIAL/PLATELET
Basophils Absolute: 0.1 10*3/uL (ref 0.0–0.1)
Basophils Relative: 0.5 % (ref 0.0–3.0)
Eosinophils Absolute: 0 10*3/uL (ref 0.0–0.7)
Eosinophils Relative: 0.2 % (ref 0.0–5.0)
HCT: 39.3 % (ref 39.0–52.0)
Hemoglobin: 13 g/dL (ref 13.0–17.0)
Lymphocytes Relative: 12.4 % (ref 12.0–46.0)
Lymphs Abs: 1.4 10*3/uL (ref 0.7–4.0)
MCHC: 33 g/dL (ref 30.0–36.0)
MCV: 96.5 fl (ref 78.0–100.0)
Monocytes Absolute: 0.8 10*3/uL (ref 0.1–1.0)
Monocytes Relative: 7.2 % (ref 3.0–12.0)
Neutro Abs: 9.3 10*3/uL — ABNORMAL HIGH (ref 1.4–7.7)
Neutrophils Relative %: 79.7 % — ABNORMAL HIGH (ref 43.0–77.0)
Platelets: 332 10*3/uL (ref 150.0–400.0)
RBC: 4.07 Mil/uL — ABNORMAL LOW (ref 4.22–5.81)
RDW: 12.7 % (ref 11.5–15.5)
WBC: 11.6 10*3/uL — ABNORMAL HIGH (ref 4.0–10.5)

## 2019-08-30 LAB — URIC ACID: Uric Acid, Serum: 4.9 mg/dL (ref 4.0–7.8)

## 2019-08-30 LAB — SEDIMENTATION RATE: Sed Rate: 25 mm/hr — ABNORMAL HIGH (ref 0–20)

## 2019-08-30 MED ORDER — MELOXICAM 15 MG PO TABS: 15.0000 mg | ORAL_TABLET | Freq: Every day | ORAL | 1 refills | Status: DC

## 2019-08-30 MED ORDER — AZELASTINE HCL 0.1 % NA SOLN: 2.0000 | Freq: Two times a day (BID) | NASAL | 3 refills | Status: DC

## 2019-08-30 NOTE — Progress Notes (Signed)
This visit was conducted in person.  BP 120/80 (BP Location: Left Arm, Patient Position: Sitting, Cuff Size: Normal)   Pulse 96   Temp 97.9 F (36.6 C) (Temporal)   Ht 6' 1"  (1.854 m)   Wt 237 lb (107.5 kg)   SpO2 96%   BMI 31.27 kg/m    CC: L foot pain Subjective:    Patient ID: Sigurd Sos., male    DOB: [birth year], 54 y.o.   MRN: [id number]  HPI: Tyrrell Stephens. is a 54 y.o. male presenting on 08/30/2019 for Foot Pain (C/o left foot pain.  Having plantar fasciitis flare.  Started 08/26/19.  Pain initially started in right foot, now in left foot.  Tried ibuprofen, helpful. )   Initially R (for a few days) now L foot pain for 4 days - using crutches to walk. Denies inciting trauma/injury or falls. Ibuprofen helps some. Denies inciting trauma/injury or falls. Known flat feet.  Bought new riding lawnmower with significant repetitive plantar and dorsiflexion - wonders if overuse injury related to this.   No known h/o gout.     Relevant past medical, surgical, family and social history reviewed and updated as indicated. Interim medical history since our last visit reviewed. Allergies and medications reviewed and updated. Outpatient Medications Prior to Visit  Medication Sig Dispense Refill  . amLODipine (NORVASC) 5 MG tablet Take 1 tablet (5 mg total) by mouth daily. 90 tablet 3  . Ascorbic Acid (VITAMIN C) 1000 MG tablet Take 1 tablet (1,000 mg total) by mouth daily.    Marland Kitchen atorvastatin (LIPITOR) 40 MG tablet Take 1 tablet (40 mg total) by mouth daily. 90 tablet 3  . Blood Glucose Monitoring Suppl (ONE TOUCH ULTRA MINI) w/Device KIT 1 kit by Does not apply route as needed. 1 kit 0  . Blood Pressure Monitoring (BLOOD PRESSURE MONITOR/L CUFF) MISC Use to monitor blood pressure as directed. 1 each 0  . cetirizine (ZYRTEC) 10 MG tablet Take 10 mg by mouth daily.    . Cholecalciferol (VITAMIN D3) 2000 units TABS Take 1 tablet by mouth daily.    . cyclobenzaprine (FLEXERIL) 10  MG tablet Take 0.5-1 tablets (5-10 mg total) by mouth 2 (two) times daily as needed for muscle spasms (sedation precautions). 30 tablet 0  . fluticasone (FLONASE) 50 MCG/ACT nasal spray Place 2 sprays into both nostrils daily. 16 g 3  . glucose blood test strip Use as instructed to check sugars daily and as needed 100 each 3  . lisinopril (ZESTRIL) 20 MG tablet Take 1 tablet (20 mg total) by mouth 2 (two) times daily. 180 tablet 3  . loratadine (CLARITIN) 10 MG tablet Take 10 mg by mouth daily. Takes during winter    . metFORMIN (GLUCOPHAGE) 500 MG tablet Take 1 tablet (500 mg total) by mouth 2 (two) times daily with a meal. 180 tablet 3  . Multiple Vitamins-Minerals (CENTRUM MULTIGUMMIES PO) Take by mouth. 50+    . OneTouch Delica Lancets 35K MISC 1 each by Does not apply route daily. 250.00 100 each 3  . meloxicam (MOBIC) 15 MG tablet TAKE 1 TABLET BY MOUTH EVERY DAY WITH FOOD 30 tablet 0   No facility-administered medications prior to visit.     Per HPI unless specifically indicated in ROS section below Review of Systems Objective:  BP 120/80 (BP Location: Left Arm, Patient Position: Sitting, Cuff Size: Normal)   Pulse 96   Temp 97.9 F (36.6 C) (Temporal)   Ht  6' 1"  (1.854 m)   Wt 237 lb (107.5 kg)   SpO2 96%   BMI 31.27 kg/m   Wt Readings from Last 3 Encounters:  08/30/19 237 lb (107.5 kg)  07/26/19 244 lb (110.7 kg)  11/03/18 238 lb 8 oz (108.2 kg)      Physical Exam Vitals and nursing note reviewed.  Constitutional:      Appearance: Normal appearance. He is not ill-appearing.  Musculoskeletal:        General: Swelling and tenderness present. Normal range of motion.     Comments:  Marked pes planus bilaterally 2+ DP/ PT bilaterally Point tender to L medial mid foot without pain with calcaneal squeeze, or at achilles or at heel. No pain to palpation of malleoli, no significant pain at MT shaft, no pain at 1st MTPJ.  Pain with inversion, plantar flexion against  resistance.  Unable to do heel lift due to pain. Difficulty bearing weight.   Neurological:     Mental Status: He is alert.       Results for orders placed or performed in visit on 08/30/19  Sedimentation rate  Result Value Ref Range   Sed Rate 25 (H) 0 - 20 mm/hr  CBC with Differential/Platelet  Result Value Ref Range   WBC 11.6 (H) 4.0 - 10.5 K/uL   RBC 4.07 (L) 4.22 - 5.81 Mil/uL   Hemoglobin 13.0 13.0 - 17.0 g/dL   HCT 39.3 39.0 - 52.0 %   MCV 96.5 78.0 - 100.0 fl   MCHC 33.0 30.0 - 36.0 g/dL   RDW 12.7 11.5 - 15.5 %   Platelets 332.0 150.0 - 400.0 K/uL   Neutrophils Relative % 79.7 (H) 43.0 - 77.0 %   Lymphocytes Relative 12.4 12.0 - 46.0 %   Monocytes Relative 7.2 3.0 - 12.0 %   Eosinophils Relative 0.2 0.0 - 5.0 %   Basophils Relative 0.5 0.0 - 3.0 %   Neutro Abs 9.3 (H) 1.4 - 7.7 K/uL   Lymphs Abs 1.4 0.7 - 4.0 K/uL   Monocytes Absolute 0.8 0.1 - 1.0 K/uL   Eosinophils Absolute 0.0 0.0 - 0.7 K/uL   Basophils Absolute 0.1 0.0 - 0.1 K/uL  Uric acid  Result Value Ref Range   Uric Acid, Serum 4.9 4.0 - 7.8 mg/dL   DG Foot Complete Left CLINICAL DATA:  Left medial foot pain.  No trauma history submitted.  EXAM: LEFT FOOT - COMPLETE 3+ VIEW  COMPARISON:  None.  FINDINGS: Pes planus deformity. Small calcaneal spur. Mild joint space narrowing and subchondral sclerosis involving the first metatarsophalangeal joint.  IMPRESSION: Degenerative changes of the first metatarsophalangeal joint. No acute findings.  Electronically Signed   By: Abigail Miyamoto M.D.   On: 08/30/2019 15:52   Assessment & Plan:  This visit occurred during the SARS-CoV-2 public health emergency.  Safety protocols were in place, including screening questions prior to the visit, additional usage of staff PPE, and extensive cleaning of exam room while observing appropriate contact time as indicated for disinfecting solutions.   Problem List Items Addressed This Visit    Pes planus    Rec  sports med eval to discuss custom orthotics.       Left foot pain - Primary    Anticipate posterior tibial tendonitis, r/o gout given marked pain. Check labwork today. Supportive care reviewed. Xray - no obvious fracture.  rec voltaren gel, refilled mobic, rec ice/elevation.  Update if not improving to consider prednisone and stronger pain medication. Out  of work x 2 days to rest foot. Pt agrees with plan.       Relevant Medications   meloxicam (MOBIC) 15 MG tablet   Other Relevant Orders   Sedimentation rate (Completed)   CBC with Differential/Platelet (Completed)   Uric acid (Completed)       Meds ordered this encounter  Medications  . meloxicam (MOBIC) 15 MG tablet    Sig: Take 1 tablet (15 mg total) by mouth daily. with food    Dispense:  30 tablet    Refill:  1  . azelastine (ASTELIN) 0.1 % nasal spray    Sig: Place 2 sprays into both nostrils 2 (two) times daily. Use in each nostril as directed    Dispense:  30 mL    Refill:  3   Orders Placed This Encounter  Procedures  . Sedimentation rate  . CBC with Differential/Platelet  . Uric acid    Patient Instructions  I am suspicious for ankle tendonitis - treat with meloxicam 19m daily, voltaren gel topically (over the counter) 3 times a day, rest, elevation, ice.  Labs today.  For flat footedness - schedule appointment with our sports medicine doctor for further evaluation ?custom orthotics.  Use astelin nasal spray for allergies. Continue antihistamine and flonase.    Follow up plan: Return if symptoms worsen or fail to improve.  JRia Bush MD

## 2019-08-30 NOTE — Assessment & Plan Note (Addendum)
Rec sports med eval to discuss custom orthotics.

## 2019-08-30 NOTE — Patient Instructions (Addendum)
I am suspicious for ankle tendonitis - treat with meloxicam 15mg  daily, voltaren gel topically (over the counter) 3 times a day, rest, elevation, ice.  Labs today.  For flat footedness - schedule appointment with our sports medicine doctor for further evaluation ?custom orthotics.  Use astelin nasal spray for allergies. Continue antihistamine and flonase.

## 2019-08-30 NOTE — Assessment & Plan Note (Addendum)
Anticipate posterior tibial tendonitis, r/o gout given marked pain. Check labwork today. Supportive care reviewed. Xray - no obvious fracture.  rec voltaren gel, refilled mobic, rec ice/elevation.  Update if not improving to consider prednisone and stronger pain medication. Out of work x 2 days to rest foot. Pt agrees with plan.

## 2019-10-31 ENCOUNTER — Other Ambulatory Visit: Payer: Self-pay | Admitting: Family Medicine

## 2019-10-31 DIAGNOSIS — E559 Vitamin D deficiency, unspecified: Secondary | ICD-10-CM

## 2019-10-31 DIAGNOSIS — Z125 Encounter for screening for malignant neoplasm of prostate: Secondary | ICD-10-CM

## 2019-10-31 DIAGNOSIS — E118 Type 2 diabetes mellitus with unspecified complications: Secondary | ICD-10-CM

## 2019-10-31 DIAGNOSIS — E7800 Pure hypercholesterolemia, unspecified: Secondary | ICD-10-CM

## 2019-10-31 DIAGNOSIS — E213 Hyperparathyroidism, unspecified: Secondary | ICD-10-CM

## 2019-11-01 ENCOUNTER — Other Ambulatory Visit: Payer: No Typology Code available for payment source

## 2019-11-08 ENCOUNTER — Encounter: Payer: No Typology Code available for payment source | Admitting: Family Medicine

## 2019-11-23 LAB — HM DIABETES EYE EXAM

## 2019-11-30 ENCOUNTER — Other Ambulatory Visit: Payer: Self-pay | Admitting: Family Medicine

## 2019-12-02 ENCOUNTER — Telehealth: Payer: Self-pay | Admitting: Family Medicine

## 2019-12-02 NOTE — Telephone Encounter (Signed)
E-scribed refills. Notified pt by phn.  Expresses his thanks.

## 2019-12-02 NOTE — Telephone Encounter (Signed)
error 

## 2019-12-02 NOTE — Telephone Encounter (Signed)
Pt currently only has 1 pill left and needs prescriptions. He said he called pharmacy on 8/24 regarding prescriptions and they said they haven't heard from [place].

## 2019-12-07 ENCOUNTER — Other Ambulatory Visit (INDEPENDENT_AMBULATORY_CARE_PROVIDER_SITE_OTHER): Payer: No Typology Code available for payment source

## 2019-12-07 ENCOUNTER — Other Ambulatory Visit: Payer: Self-pay

## 2019-12-07 DIAGNOSIS — Z125 Encounter for screening for malignant neoplasm of prostate: Secondary | ICD-10-CM | POA: Diagnosis not present

## 2019-12-07 DIAGNOSIS — E7800 Pure hypercholesterolemia, unspecified: Secondary | ICD-10-CM | POA: Diagnosis not present

## 2019-12-07 DIAGNOSIS — E118 Type 2 diabetes mellitus with unspecified complications: Secondary | ICD-10-CM | POA: Diagnosis not present

## 2019-12-07 DIAGNOSIS — E559 Vitamin D deficiency, unspecified: Secondary | ICD-10-CM | POA: Diagnosis not present

## 2019-12-07 DIAGNOSIS — E213 Hyperparathyroidism, unspecified: Secondary | ICD-10-CM | POA: Diagnosis not present

## 2019-12-07 LAB — COMPREHENSIVE METABOLIC PANEL
ALT: 23 U/L (ref 0–53)
AST: 17 U/L (ref 0–37)
Albumin: 4 g/dL (ref 3.5–5.2)
Alkaline Phosphatase: 51 U/L (ref 39–117)
BUN: 22 mg/dL (ref 6–23)
CO2: 26 mEq/L (ref 19–32)
Calcium: 10 mg/dL (ref 8.4–10.5)
Chloride: 106 mEq/L (ref 96–112)
Creatinine, Ser: 1.22 mg/dL (ref 0.40–1.50)
GFR: 75.53 mL/min (ref >60.00)
Glucose, Bld: 97 mg/dL (ref 70–99)
Potassium: 4.2 mEq/L (ref 3.5–5.1)
Sodium: 139 mEq/L (ref 135–145)
Total Bilirubin: 0.6 mg/dL (ref 0.2–1.2)
Total Protein: 6.5 g/dL (ref 6.0–8.3)

## 2019-12-07 LAB — LIPID PANEL
Cholesterol: 113 mg/dL (ref 0–200)
HDL: 33.7 mg/dL — ABNORMAL LOW (ref >39.00)
LDL Cholesterol: 56 mg/dL (ref 0–99)
NonHDL: 79.14
Total CHOL/HDL Ratio: 3
Triglycerides: 114 mg/dL (ref 0.0–149.0)
VLDL: 22.8 mg/dL (ref 0.0–40.0)

## 2019-12-07 LAB — VITAMIN D 25 HYDROXY (VIT D DEFICIENCY, FRACTURES): VITD: 28.54 ng/mL — ABNORMAL LOW (ref 30.00–100.00)

## 2019-12-07 LAB — PSA: PSA: 1.25 ng/mL (ref 0.10–4.00)

## 2019-12-07 LAB — HEMOGLOBIN A1C: Hgb A1c MFr Bld: 6.4 % (ref 4.6–6.5)

## 2019-12-08 LAB — PARATHYROID HORMONE, INTACT (NO CA): PTH: 76 pg/mL — ABNORMAL HIGH (ref 14–64)

## 2019-12-08 LAB — CALCIUM, IONIZED: Calcium, Ion: 5.5 mg/dL (ref 4.8–5.6)

## 2019-12-14 ENCOUNTER — Ambulatory Visit (INDEPENDENT_AMBULATORY_CARE_PROVIDER_SITE_OTHER): Payer: No Typology Code available for payment source | Admitting: Family Medicine

## 2019-12-14 ENCOUNTER — Other Ambulatory Visit: Payer: Self-pay

## 2019-12-14 ENCOUNTER — Encounter: Payer: Self-pay | Admitting: Family Medicine

## 2019-12-14 VITALS — BP 114/80 | HR 72 | Temp 98.2°F | Ht 73.0 in | Wt 238.4 lb

## 2019-12-14 DIAGNOSIS — Z1211 Encounter for screening for malignant neoplasm of colon: Secondary | ICD-10-CM | POA: Diagnosis not present

## 2019-12-14 DIAGNOSIS — Z0000 Encounter for general adult medical examination without abnormal findings: Secondary | ICD-10-CM

## 2019-12-14 DIAGNOSIS — E1169 Type 2 diabetes mellitus with other specified complication: Secondary | ICD-10-CM

## 2019-12-14 DIAGNOSIS — I10 Essential (primary) hypertension: Secondary | ICD-10-CM

## 2019-12-14 DIAGNOSIS — E118 Type 2 diabetes mellitus with unspecified complications: Secondary | ICD-10-CM

## 2019-12-14 DIAGNOSIS — M25562 Pain in left knee: Secondary | ICD-10-CM | POA: Insufficient documentation

## 2019-12-14 DIAGNOSIS — E669 Obesity, unspecified: Secondary | ICD-10-CM

## 2019-12-14 DIAGNOSIS — E559 Vitamin D deficiency, unspecified: Secondary | ICD-10-CM

## 2019-12-14 DIAGNOSIS — E785 Hyperlipidemia, unspecified: Secondary | ICD-10-CM

## 2019-12-14 DIAGNOSIS — E213 Hyperparathyroidism, unspecified: Secondary | ICD-10-CM

## 2019-12-14 MED ORDER — METFORMIN HCL 500 MG PO TABS
500.0000 mg | ORAL_TABLET | Freq: Two times a day (BID) | ORAL | 11 refills | Status: DC
Start: 2019-12-14 — End: 2021-01-05

## 2019-12-14 MED ORDER — LISINOPRIL 20 MG PO TABS
20.0000 mg | ORAL_TABLET | Freq: Two times a day (BID) | ORAL | 11 refills | Status: DC
Start: 2019-12-14 — End: 2021-01-05

## 2019-12-14 MED ORDER — ATORVASTATIN CALCIUM 40 MG PO TABS
40.0000 mg | ORAL_TABLET | Freq: Every day | ORAL | 11 refills | Status: DC
Start: 2019-12-14 — End: 2021-01-05

## 2019-12-14 MED ORDER — AMLODIPINE BESYLATE 5 MG PO TABS
5.0000 mg | ORAL_TABLET | Freq: Every day | ORAL | 11 refills | Status: DC
Start: 2019-12-14 — End: 2020-11-08

## 2019-12-14 NOTE — Assessment & Plan Note (Signed)
Chronic, stable. Continue current regimen. Discussed possibly transitioning to lotrel to simplify regimen.

## 2019-12-14 NOTE — Assessment & Plan Note (Addendum)
Overall reassuring exam. Possible knee strain. Continue voltaren topically PRN.

## 2019-12-14 NOTE — Assessment & Plan Note (Signed)
rec restart vit D replacement.

## 2019-12-14 NOTE — Assessment & Plan Note (Signed)
Chronic, stable. Recommend replace vit D and reassess.

## 2019-12-14 NOTE — Assessment & Plan Note (Signed)
Chronic, stable on atorvastatin. The ASCVD Risk score (Goff DC Jr., et al., 2013) failed to calculate for the following reasons:   The valid total cholesterol range is 130 to 320 mg/dL  

## 2019-12-14 NOTE — Patient Instructions (Addendum)
We will request records for latest eye exam (Dr Vickey Sages at [facility] care center).  Look into shingrix vaccine (2 shot shingles series).  Look into lotrel (amlodipine - benazepril) combo pill may replace your current BP medicines.  Work on diet for goal improved sugars.  Return as needed or in 6 months for diabetes follow up visit.   Health Maintenance, Male Adopting a healthy lifestyle and getting preventive care are important in promoting health and wellness. Ask your health care provider about:  The right schedule for you to have regular tests and exams.  Things you can do on your own to prevent diseases and keep yourself healthy. What should I know about diet, weight, and exercise? Eat a healthy diet   Eat a diet that includes plenty of vegetables, fruits, low-fat dairy products, and lean protein.  Do not eat a lot of foods that are high in solid fats, added sugars, or sodium. Maintain a healthy weight Body mass index (BMI) is a measurement that can be used to identify possible weight problems. It estimates body fat based on height and weight. Your health care provider can help determine your BMI and help you achieve or maintain a healthy weight. Get regular exercise Get regular exercise. This is one of the most important things you can do for your health. Most adults should:  Exercise for at least 150 minutes each week. The exercise should increase your heart rate and make you sweat (moderate-intensity exercise).  Do strengthening exercises at least twice a week. This is in addition to the moderate-intensity exercise.  Spend less time sitting. Even light physical activity can be beneficial. Watch cholesterol and blood lipids Have your blood tested for lipids and cholesterol at 52 years of age, then have this test every 5 years. You may need to have your cholesterol levels checked more often if:  Your lipid or cholesterol levels are high.  You are older than 52 years of  age.  You are at high risk for heart disease. What should I know about cancer screening? Many types of cancers can be detected early and may often be prevented. Depending on your health history and family history, you may need to have cancer screening at various ages. This may include screening for:  Colorectal cancer.  Prostate cancer.  Skin cancer.  Lung cancer. What should I know about heart disease, diabetes, and high blood pressure? Blood pressure and heart disease  High blood pressure causes heart disease and increases the risk of stroke. This is more likely to develop in people who have high blood pressure readings, are of African descent, or are overweight.  Talk with your health care provider about your target blood pressure readings.  Have your blood pressure checked: ? Every 3-5 years if you are 67-41 years of age. ? Every year if you are 84 years old or older.  If you are between the ages of 31 and 52 and are a current or former smoker, ask your health care provider if you should have a one-time screening for abdominal aortic aneurysm (AAA). Diabetes Have regular diabetes screenings. This checks your fasting blood sugar level. Have the screening done:  Once every three years after age 67 if you are at a normal weight and have a low risk for diabetes.  More often and at a younger age if you are overweight or have a high risk for diabetes. What should I know about preventing infection? Hepatitis B If you have a higher  risk for hepatitis B, you should be screened for this virus. Talk with your health care provider to find out if you are at risk for hepatitis B infection. Hepatitis C Blood testing is recommended for:  Everyone born from [birth year] through 1963.  Anyone with known risk factors for hepatitis C. Sexually transmitted infections (STIs)  You should be screened each year for STIs, including gonorrhea and chlamydia, if: ? You are sexually active and are younger  than 52 years of age. ? You are older than 52 years of age and your health care provider tells you that you are at risk for this type of infection. ? Your sexual activity has changed since you were last screened, and you are at increased risk for chlamydia or gonorrhea. Ask your health care provider if you are at risk.  Ask your health care provider about whether you are at high risk for HIV. Your health care provider may recommend a prescription medicine to help prevent HIV infection. If you choose to take medicine to prevent HIV, you should first get tested for HIV. You should then be tested every 3 months for as long as you are taking the medicine. Follow these instructions at home: Lifestyle  Do not use any products that contain nicotine or tobacco, such as cigarettes, e-cigarettes, and chewing tobacco. If you need help quitting, ask your health care provider.  Do not use street drugs.  Do not share needles.  Ask your health care provider for help if you need support or information about quitting drugs. Alcohol use  Do not drink alcohol if your health care provider tells you not to drink.  If you drink alcohol: ? Limit how much you have to 0-2 drinks a day. ? Be aware of how much alcohol is in your drink. In the [place] one drink equals one 12 oz bottle of beer (355 mL), one 5 oz glass of wine (148 mL), or one 1 oz glass of hard liquor (44 mL). General instructions  Schedule regular health, dental, and eye exams.  Stay current with your vaccines.  Tell your health care provider if: ? You often feel depressed. ? You have ever been abused or do not feel safe at home. Summary  Adopting a healthy lifestyle and getting preventive care are important in promoting health and wellness.  Follow your health care provider's instructions about healthy diet, exercising, and getting tested or screened for diseases.  Follow your health care provider's instructions on monitoring your  cholesterol and blood pressure. This information is not intended to replace advice given to you by your health care provider. Make sure you discuss any questions you have with your health care provider. Document Revised: 03/18/2018 Document Reviewed: 03/18/2018 Elsevier Patient Education  2020 [employer].

## 2019-12-14 NOTE — Assessment & Plan Note (Addendum)
Chronic, controlled. Continue metformin.  Will request latest eye exam.

## 2019-12-14 NOTE — Assessment & Plan Note (Signed)
Preventative protocols reviewed and updated unless pt declined. Discussed healthy diet and lifestyle.  

## 2019-12-14 NOTE — Assessment & Plan Note (Signed)
Encouraged healthy diet and lifestyle to affect sustainable weight loss.  

## 2019-12-14 NOTE — Progress Notes (Signed)
This visit was conducted in person.  BP 114/80 (BP Location: Left Arm, Patient Position: Sitting, Cuff Size: Large)   Pulse 72   Temp 98.2 F (36.8 C) (Temporal)   Ht 6' 1"  (1.854 m)   Wt 238 lb 6 oz (108.1 kg)   SpO2 96%   BMI 31.45 kg/m   BP Readings from Last 3 Encounters:  12/14/19 114/80  08/30/19 120/80  07/26/19 132/82    CC: CPE Subjective:    Patient ID: Joseph Sos., male    DOB: [birth year], 54 y.o.   MRN: [id number]  HPI: Joseph Kenner. is a 54 y.o. male presenting on 12/14/2019 for Annual Exam   Ongoing L knee pain for 2-3 weeks. Denies inciting trauma/injury or falls. Has treated with voltaren gel with benefit and methocarbamol.   Preventative: Colon cancer screening - discussed options, would like colonoscopy. defered last year due to [place] pandemic. Wants to wait until 04/2019. No bowel changes or blood in stool.  Fmhx prostate cancer in father - No nocturia, no changes in stream. PSA yearly Flu shot yearly Tdap 2013 Pneumovax 2006.  COVID vaccine - Pfizer x2 07/2019  Shingrix - discussed. Will defer for now.  Seat belt use discussed. Sunscreen use discussed.No changing moles on skin. Non smoker Alcohol - none Dentist 4 times a year  Eye exam yearly   Caffeine: rare. 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  Diet: lots of water throughout the day, some fruits/vegetables,watches carbs and starches     Relevant past medical, surgical, family and social history reviewed and updated as indicated. Interim medical history since our last visit reviewed. Allergies and medications reviewed and updated. Outpatient Medications Prior to Visit  Medication Sig Dispense Refill  . Ascorbic Acid (VITAMIN C) 1000 MG tablet Take 1 tablet (1,000 mg total) by mouth daily.    Marland Kitchen azelastine (ASTELIN) 0.1 % nasal spray  Place 2 sprays into both nostrils 2 (two) times daily. Use in each nostril as directed 30 mL 3  . Blood Glucose Monitoring Suppl (ONE TOUCH ULTRA MINI) w/Device KIT 1 kit by Does not apply route as needed. 1 kit 0  . Blood Pressure Monitoring (BLOOD PRESSURE MONITOR/L CUFF) MISC Use to monitor blood pressure as directed. 1 each 0  . cetirizine (ZYRTEC) 10 MG tablet Take 10 mg by mouth daily.    . cyclobenzaprine (FLEXERIL) 10 MG tablet Take 0.5-1 tablets (5-10 mg total) by mouth 2 (two) times daily as needed for muscle spasms (sedation precautions). 30 tablet 0  . fluticasone (FLONASE) 50 MCG/ACT nasal spray Place 2 sprays into both nostrils daily. 16 g 3  . glucose blood test strip Use as instructed to check sugars daily and as needed 100 each 3  . Multiple Vitamins-Minerals (CENTRUM MULTIGUMMIES PO) Take by mouth. 50+    . OneTouch Delica Lancets 00Q MISC 1 each by Does not apply route daily. 250.00 100 each 3  . amLODipine (NORVASC) 5 MG tablet Take 1 tablet by mouth once daily 30 tablet 0  . atorvastatin (LIPITOR) 40 MG tablet Take 1 tablet by mouth once daily 30 tablet 0  . lisinopril (ZESTRIL) 20 MG tablet Take 1 tablet by mouth twice daily 60 tablet 0  . meloxicam (MOBIC) 15 MG tablet Take 1 tablet (15 mg total) by mouth daily. with food 30 tablet 1  .  metFORMIN (GLUCOPHAGE) 500 MG tablet TAKE 1 TABLET BY MOUTH TWICE DAILY WITH  A  MEAL. 60 tablet 0  . Cholecalciferol (VITAMIN D3) 2000 units TABS Take 1 tablet by mouth daily. (Patient not taking: Reported on 12/14/2019)    . loratadine (CLARITIN) 10 MG tablet Take 10 mg by mouth daily. Takes during winter (Patient not taking: Reported on 12/14/2019)     No facility-administered medications prior to visit.     Per HPI unless specifically indicated in ROS section below Review of Systems  Constitutional: Negative for activity change, appetite change, chills, fatigue, fever and unexpected weight change.  HENT: Negative for hearing loss.     Eyes: Negative for visual disturbance.  Respiratory: Negative for cough, chest tightness, shortness of breath and wheezing.   Cardiovascular: Negative for chest pain, palpitations and leg swelling.  Gastrointestinal: Negative for abdominal distention, abdominal pain, blood in stool, constipation, diarrhea, nausea and vomiting.  Genitourinary: Negative for difficulty urinating and hematuria.  Musculoskeletal: Positive for arthralgias (L knee pain). Negative for myalgias and neck pain.  Skin: Negative for rash.  Neurological: Negative for dizziness, seizures, syncope and headaches.  Hematological: Negative for adenopathy. Does not bruise/bleed easily.  Psychiatric/Behavioral: Negative for dysphoric mood. The patient is not nervous/anxious.    Objective:  BP 114/80 (BP Location: Left Arm, Patient Position: Sitting, Cuff Size: Large)   Pulse 72   Temp 98.2 F (36.8 C) (Temporal)   Ht 6' 1"  (1.854 m)   Wt 238 lb 6 oz (108.1 kg)   SpO2 96%   BMI 31.45 kg/m   Wt Readings from Last 3 Encounters:  12/14/19 238 lb 6 oz (108.1 kg)  08/30/19 237 lb (107.5 kg)  07/26/19 244 lb (110.7 kg)      Physical Exam Vitals and nursing note reviewed.  Constitutional:      General: He is not in acute distress.    Appearance: Normal appearance. He is well-developed. He is not ill-appearing.  HENT:     Head: Normocephalic and atraumatic.     Right Ear: Hearing, tympanic membrane, ear canal and external ear normal.     Left Ear: Hearing, tympanic membrane, ear canal and external ear normal.     Nose: Nose normal.  Eyes:     General: No scleral icterus.    Conjunctiva/sclera: Conjunctivae normal.     Pupils: Pupils are equal, round, and reactive to light.  Neck:     Thyroid: No thyroid mass, thyromegaly or thyroid tenderness.  Cardiovascular:     Rate and Rhythm: Normal rate and regular rhythm.     Pulses: Normal pulses.          Radial pulses are 2+ on the right side and 2+ on the left side.      Heart sounds: Normal heart sounds. No murmur heard.   Pulmonary:     Effort: Pulmonary effort is normal. No respiratory distress.     Breath sounds: Normal breath sounds. No wheezing, rhonchi or rales.  Abdominal:     General: Abdomen is flat. Bowel sounds are normal. There is no distension.     Palpations: Abdomen is soft. There is no mass.     Tenderness: There is no abdominal tenderness. There is no guarding or rebound.     Hernia: No hernia is present.  Musculoskeletal:        General: Normal range of motion.     Cervical back: Normal range of motion and neck supple.     Right lower  leg: No edema.     Left lower leg: No edema.     Comments:  R knee WNL L knee exam: No deformity on inspection. Discomfort to medial joint line with palpation No effusion/swelling noted. FROM in flex/extension without crepitus. No popliteal fullness. Neg drawer test. Neg mcmurray test. No pain with valgus/varus stress. No PFgrind. No abnormal patellar mobility.   Lymphadenopathy:     Cervical: No cervical adenopathy.  Skin:    General: Skin is warm and dry.     Findings: No rash.  Neurological:     General: No focal deficit present.     Mental Status: He is alert and oriented to person, place, and time.     Comments: CN grossly intact, station and gait intact  Psychiatric:        Mood and Affect: Mood normal.        Behavior: Behavior normal.        Thought Content: Thought content normal.        Judgment: Judgment normal.       Results for orders placed or performed in visit on 12/07/19  Calcium, ionized  Result Value Ref Range   Calcium, Ion 5.5 4.8 - 5.6 mg/dL  Parathyroid hormone, intact (no Ca)  Result Value Ref Range   PTH 76 (H) 14 - 64 pg/mL  VITAMIN D 25 Hydroxy (Vit-D Deficiency, Fractures)  Result Value Ref Range   VITD 28.54 (L) 30.00 - 100.00 ng/mL  PSA  Result Value Ref Range   PSA 1.25 0.10 - 4.00 ng/mL  Hemoglobin A1c  Result Value Ref Range   Hgb A1c MFr Bld  6.4 4.6 - 6.5 %  Comprehensive metabolic panel  Result Value Ref Range   Sodium 139 135 - 145 mEq/L   Potassium 4.2 3.5 - 5.1 mEq/L   Chloride 106 96 - 112 mEq/L   CO2 26 19 - 32 mEq/L   Glucose, Bld 97 70 - 99 mg/dL   BUN 22 6 - 23 mg/dL   Creatinine, Ser 1.22 0.40 - 1.50 mg/dL   Total Bilirubin 0.6 0.2 - 1.2 mg/dL   Alkaline Phosphatase 51 39 - 117 U/L   AST 17 0 - 37 U/L   ALT 23 0 - 53 U/L   Total Protein 6.5 6.0 - 8.3 g/dL   Albumin 4.0 3.5 - 5.2 g/dL   GFR 75.53 >60.00 mL/min   Calcium 10.0 8.4 - 10.5 mg/dL  Lipid panel  Result Value Ref Range   Cholesterol 113 0 - 200 mg/dL   Triglycerides 114.0 0 - 149 mg/dL   HDL 33.70 (L) >39.00 mg/dL   VLDL 22.8 0.0 - 40.0 mg/dL   LDL Cholesterol 56 0 - 99 mg/dL   Total CHOL/HDL Ratio 3    NonHDL 79.14    Assessment & Plan:  This visit occurred during the SARS-CoV-2 public health emergency.  Safety protocols were in place, including screening questions prior to the visit, additional usage of staff PPE, and extensive cleaning of exam room while observing appropriate contact time as indicated for disinfecting solutions.   Problem List Items Addressed This Visit    Vitamin D deficiency    rec restart vit D replacement.       Obesity, Class I, BMI 30-34.9    Encouraged healthy diet and lifestyle to affect sustainable weight loss      Left medial knee pain    Overall reassuring exam. Possible knee strain. Continue voltaren topically PRN.  HYPERTENSION, BENIGN SYSTEMIC    Chronic, stable. Continue current regimen. Discussed possibly transitioning to lotrel to simplify regimen.       Relevant Medications   amLODipine (NORVASC) 5 MG tablet   atorvastatin (LIPITOR) 40 MG tablet   lisinopril (ZESTRIL) 20 MG tablet   Hyperparathyroidism (HCC)    Chronic, stable. Recommend replace vit D and reassess.       Hyperlipidemia associated with type 2 diabetes mellitus (HCC)    Chronic, stable on atorvastatin. The ASCVD Risk  score Mikey Bussing DC Jr., et al., 2013) failed to calculate for the following reasons:   The valid total cholesterol range is 130 to 320 mg/dL       Relevant Medications   atorvastatin (LIPITOR) 40 MG tablet   lisinopril (ZESTRIL) 20 MG tablet   metFORMIN (GLUCOPHAGE) 500 MG tablet   Health maintenance examination    Preventative protocols reviewed and updated unless pt declined. Discussed healthy diet and lifestyle.       Controlled diabetes mellitus type 2 with complications (HCC)    Chronic, controlled. Continue metformin.  Will request latest eye exam.      Relevant Medications   atorvastatin (LIPITOR) 40 MG tablet   lisinopril (ZESTRIL) 20 MG tablet   metFORMIN (GLUCOPHAGE) 500 MG tablet    Other Visit Diagnoses    Special screening for malignant neoplasms, colon    -  Primary   Relevant Orders   Ambulatory referral to Gastroenterology       Meds ordered this encounter  Medications  . amLODipine (NORVASC) 5 MG tablet    Sig: Take 1 tablet (5 mg total) by mouth daily.    Dispense:  30 tablet    Refill:  11  . atorvastatin (LIPITOR) 40 MG tablet    Sig: Take 1 tablet (40 mg total) by mouth daily.    Dispense:  30 tablet    Refill:  11  . lisinopril (ZESTRIL) 20 MG tablet    Sig: Take 1 tablet (20 mg total) by mouth 2 (two) times daily.    Dispense:  60 tablet    Refill:  11  . metFORMIN (GLUCOPHAGE) 500 MG tablet    Sig: Take 1 tablet (500 mg total) by mouth 2 (two) times daily with a meal.    Dispense:  60 tablet    Refill:  11   Orders Placed This Encounter  Procedures  . Ambulatory referral to Gastroenterology    Referral Priority:   Routine    Referral Type:   Consultation    Referral Reason:   Specialty Services Required    Number of Visits Requested:   1    Patient instructions: We will request records for latest eye exam (Dr Orvan Seen at [facility]).  Look into shingrix vaccine (2 shot shingles series).  Look into lotrel (amlodipine -  benazepril) combo pill may replace your current BP medicines.  Work on diet for goal improved sugars.  Return as needed or in 6 months for diabetes follow up visit.   Follow up plan: Return in about 6 months (around 06/12/2020) for follow up visit.  [name], MD

## 2019-12-20 ENCOUNTER — Encounter: Payer: Self-pay | Admitting: Family Medicine

## 2020-03-01 ENCOUNTER — Other Ambulatory Visit: Payer: Self-pay

## 2020-03-01 ENCOUNTER — Ambulatory Visit (INDEPENDENT_AMBULATORY_CARE_PROVIDER_SITE_OTHER): Payer: No Typology Code available for payment source

## 2020-03-01 DIAGNOSIS — Z23 Encounter for immunization: Secondary | ICD-10-CM

## 2020-04-08 DIAGNOSIS — U071 COVID-19: Secondary | ICD-10-CM

## 2020-04-08 HISTORY — DX: COVID-19: U07.1

## 2020-04-11 ENCOUNTER — Telehealth: Payer: Self-pay | Admitting: Family Medicine

## 2020-04-11 ENCOUNTER — Encounter: Payer: Self-pay | Admitting: Family Medicine

## 2020-04-11 NOTE — Telephone Encounter (Signed)
Patient seen at [facility] on 04/10/2020, tested positive for COVID.  Please call tomorrow- when was first day of symptoms and what are symptoms currently?

## 2020-04-12 ENCOUNTER — Telehealth (INDEPENDENT_AMBULATORY_CARE_PROVIDER_SITE_OTHER): Payer: No Typology Code available for payment source | Admitting: Family Medicine

## 2020-04-12 ENCOUNTER — Encounter: Payer: Self-pay | Admitting: Family Medicine

## 2020-04-12 VITALS — Temp 97.1°F | Ht 73.5 in | Wt 236.0 lb

## 2020-04-12 DIAGNOSIS — E118 Type 2 diabetes mellitus with unspecified complications: Secondary | ICD-10-CM

## 2020-04-12 DIAGNOSIS — I10 Essential (primary) hypertension: Secondary | ICD-10-CM | POA: Diagnosis not present

## 2020-04-12 DIAGNOSIS — U071 COVID-19: Secondary | ICD-10-CM | POA: Insufficient documentation

## 2020-04-12 NOTE — Assessment & Plan Note (Addendum)
Overall seems stable, improving symptoms, anticipate initial vaccination series is providing some protection.  Reviewed course to date as well as anticipated course of improvement. Reviewed red flags to suggest COVID PNA and need in-person evaluation.  Reviewed latest CDC guidelines for isolation and quarantine.  Discussed possible outpatient treatments for covid, he declines referral to outpatient treatment team.  Will place on COVID call list and check on pt on Friday one final time.

## 2020-04-12 NOTE — Telephone Encounter (Signed)
Patient had virtual visit with me today. Please call Friday for update on symptoms, if continues doing well, may stop following.

## 2020-04-12 NOTE — Progress Notes (Signed)
Patient ID: Joseph Sos., male    DOB: 1966/02/04, 55 y.o.   MRN: [id number]  Virtual visit completed through [place], a video enabled telemedicine application. Due to national recommendations of social distancing due to COVID-19, a virtual visit is felt to be most appropriate for this patient at this time. Reviewed limitations, risks, security and privacy concerns of performing a virtual visit and the availability of in person appointments. I also reviewed that there may be a patient responsible charge related to this service. The patient agreed to proceed.   Patient location: home Provider location: [place] at [facility], office Persons participating in this virtual visit: patient, provider, wife present as well   If any vitals were documented, they were collected by patient at home unless specified below.    Temp (!) 97.1 F (36.2 C) (Temporal)   Ht 6' 1.5" (1.867 m)   Wt 236 lb (107 kg)   BMI 30.71 kg/m    CC: COVID infection Subjective:   HPI: [name]. is a 55 y.o. male presenting on 04/12/2020 for Covid Positive (Pt was Dx this past Sunday with COVID. Symptoms include: Sinus drainage, no fever, and cough)   First day of symptoms was 04/06/2020.  Patient seen at [facility] on 04/09/2020, tested positive for COVID. Tested negative for flu.  Symptoms have included malaise, back pain, fever, nasal congestion, sinus drainage. Tmax 103. Initial diarrhea. Persistent nagging cough productive of clear mucous.  Fever has since resolved.   No ear or tooth pain, ST, PNdrainage, dyspnea or wheezing, loss of taste or smell, abd pain, nausea.   Managing symptoms with ibuprofen.   [place] 07/2019 x2. Has not been boosted.  Known well controlled diabetic, hypertensive. BMI 30.   No known COVID exposure.  Wife tested both positive and negative, with mild congestion/sinus drainage.  Son tested negative.      Relevant past medical, surgical, family and social history  reviewed and updated as indicated. Interim medical history since our last visit reviewed. Allergies and medications reviewed and updated. Outpatient Medications Prior to Visit  Medication Sig Dispense Refill  . amLODipine (NORVASC) 5 MG tablet Take 1 tablet (5 mg total) by mouth daily. 30 tablet 11  . Ascorbic Acid (VITAMIN C) 1000 MG tablet Take 1 tablet (1,000 mg total) by mouth daily.    Marland Kitchen atorvastatin (LIPITOR) 40 MG tablet Take 1 tablet (40 mg total) by mouth daily. 30 tablet 11  . azelastine (ASTELIN) 0.1 % nasal spray Place 2 sprays into both nostrils 2 (two) times daily. Use in each nostril as directed 30 mL 3  . Blood Glucose Monitoring Suppl (ONE TOUCH ULTRA MINI) w/Device KIT 1 kit by Does not apply route as needed. 1 kit 0  . Blood Pressure Monitoring (BLOOD PRESSURE MONITOR/L CUFF) MISC Use to monitor blood pressure as directed. 1 each 0  . cetirizine (ZYRTEC) 10 MG tablet Take 10 mg by mouth daily.    . Cholecalciferol (VITAMIN D3) 2000 units TABS Take 1 tablet by mouth daily.    . cyclobenzaprine (FLEXERIL) 10 MG tablet Take 0.5-1 tablets (5-10 mg total) by mouth 2 (two) times daily as needed for muscle spasms (sedation precautions). 30 tablet 0  . fluticasone (FLONASE) 50 MCG/ACT nasal spray Place 2 sprays into both nostrils daily. 16 g 3  . glucose blood test strip Use as instructed to check sugars daily and as needed 100 each 3  . lisinopril (ZESTRIL) 20 MG tablet Take  1 tablet (20 mg total) by mouth 2 (two) times daily. 60 tablet 11  . loratadine (CLARITIN) 10 MG tablet Take 10 mg by mouth daily. Takes during winter    . metFORMIN (GLUCOPHAGE) 500 MG tablet Take 1 tablet (500 mg total) by mouth 2 (two) times daily with a meal. 60 tablet 11  . Multiple Vitamins-Minerals (CENTRUM MULTIGUMMIES PO) Take by mouth. 50+    . OneTouch Delica Lancets 43H MISC 1 each by Does not apply route daily. 250.00 100 each 3   No facility-administered medications prior to visit.     Per HPI  unless specifically indicated in ROS section below Review of Systems Objective:  Temp (!) 97.1 F (36.2 C) (Temporal)   Ht 6' 1.5" (1.867 m)   Wt 236 lb (107 kg)   BMI 30.71 kg/m   Wt Readings from Last 3 Encounters:  04/12/20 236 lb (107 kg)  12/14/19 238 lb 6 oz (108.1 kg)  08/30/19 237 lb (107.5 kg)       Physical exam: Gen: alert, NAD, not ill appearing Pulm: speaks in complete sentences without increased work of breathing Psych: normal mood, normal thought content      Lab Results  Component Value Date   CREATININE 1.22 12/07/2019   BUN 22 12/07/2019   NA 139 12/07/2019   K 4.2 12/07/2019   CL 106 12/07/2019   CO2 26 12/07/2019    Assessment & Plan:   Problem List Items Addressed This Visit    HYPERTENSION, BENIGN SYSTEMIC   COVID-19 virus infection - Primary    Overall seems stable, improving symptoms, anticipate initial vaccination series is providing some protection.  Reviewed course to date as well as anticipated course of improvement. Reviewed red flags to suggest COVID PNA and need in-person evaluation.  Reviewed latest CDC guidelines for isolation and quarantine.  Discussed possible outpatient treatments for covid, he declines referral to outpatient treatment team.  Will place on COVID call list and check on pt on Friday one final time.       Controlled diabetes mellitus type 2 with complications ([place])       No orders of the defined types were placed in this encounter.  No orders of the defined types were placed in this encounter.   I discussed the assessment and treatment plan with the patient. The patient was provided an opportunity to ask questions and all were answered. The patient agreed with the plan and demonstrated an understanding of the instructions. The patient was advised to call back or seek an in-person evaluation if the symptoms worsen or if the condition fails to improve as anticipated.  Follow up plan: Return if symptoms worsen or  fail to improve.  [name], MD

## 2020-04-14 NOTE — Telephone Encounter (Signed)
Glad he's doing better. Would suggest he add flonase to his daily zyrtec temporarily until drainage is improved. Alternatively he can use his astelin nasal spray.

## 2020-04-14 NOTE — Telephone Encounter (Signed)
Spoke with pt. States that he is markedly improved but is still having issues with post nasal drip.

## 2020-04-18 NOTE — Telephone Encounter (Signed)
Spoke with pt relaying Dr. G's message.  Pt verbalizes understanding and expresses his thanks.  

## 2020-06-12 ENCOUNTER — Ambulatory Visit: Payer: No Typology Code available for payment source | Admitting: Family Medicine

## 2020-07-11 ENCOUNTER — Encounter: Payer: Self-pay | Admitting: Family Medicine

## 2020-07-11 ENCOUNTER — Ambulatory Visit (INDEPENDENT_AMBULATORY_CARE_PROVIDER_SITE_OTHER): Payer: No Typology Code available for payment source | Admitting: Family Medicine

## 2020-07-11 ENCOUNTER — Other Ambulatory Visit: Payer: Self-pay

## 2020-07-11 VITALS — BP 156/104 | HR 67 | Temp 98.0°F | Ht 73.5 in | Wt 243.2 lb

## 2020-07-11 DIAGNOSIS — E118 Type 2 diabetes mellitus with unspecified complications: Secondary | ICD-10-CM | POA: Diagnosis not present

## 2020-07-11 DIAGNOSIS — I10 Essential (primary) hypertension: Secondary | ICD-10-CM

## 2020-07-11 DIAGNOSIS — M7122 Synovial cyst of popliteal space [Baker], left knee: Secondary | ICD-10-CM | POA: Insufficient documentation

## 2020-07-11 LAB — POCT GLYCOSYLATED HEMOGLOBIN (HGB A1C): Hemoglobin A1C: 6 % — AB (ref 4.0–5.6)

## 2020-07-11 MED ORDER — ONETOUCH ULTRA MINI W/DEVICE KIT: 1.0000 | PACK | 0 refills | Status: AC | PRN

## 2020-07-11 NOTE — Assessment & Plan Note (Signed)
Anticipate baker's cyst of left knee.  Discussed supplements which may help joint health.

## 2020-07-11 NOTE — Patient Instructions (Addendum)
Your goal blood pressure is <140/90, ideally lower.  Work on low salt/sodium diet - goal <1.5gm (1,500mg ) per day. Eat a diet high in fruits/vegetables and whole grains.  Look into mediterranean and DASH diet. Goal activity is 137min/wk of moderate intensity exercise.  This can be split into 30 minute chunks.  If you are not at this level, you can start with smaller 10-15 min increments and slowly build up activity. Look at www.heart.org for more resources   May try glucosamine, turmeric, vit D and/or fish oil for joints.   https://www.mata.com/.pdf">  DASH Eating Plan DASH stands for Dietary Approaches to Stop Hypertension. The DASH eating plan is a healthy eating plan that has been shown to:  Reduce high blood pressure (hypertension).  Reduce your risk for type 2 diabetes, heart disease, and stroke.  Help with weight loss. What are tips for following this plan? Reading food labels  Check food labels for the amount of salt (sodium) per serving. Choose foods with less than 5 percent of the Daily Value of sodium. Generally, foods with less than 300 milligrams (mg) of sodium per serving fit into this eating plan.  To find whole grains, look for the word "whole" as the first word in the ingredient list. Shopping  Buy products labeled as "low-sodium" or "no salt added."  Buy fresh foods. Avoid canned foods and pre-made or frozen meals. Cooking  Avoid adding salt when cooking. Use salt-free seasonings or herbs instead of table salt or sea salt. Check with your health care provider or pharmacist before using salt substitutes.  Do not fry foods. Cook foods using healthy methods such as baking, boiling, grilling, roasting, and broiling instead.  Cook with heart-healthy oils, such as olive, canola, avocado, soybean, or sunflower oil. Meal planning  Eat a balanced diet that includes: ? 4 or more servings of fruits and 4 or more servings of vegetables  each day. Try to fill one-half of your plate with fruits and vegetables. ? 6-8 servings of whole grains each day. ? Less than 6 oz (170 g) of lean meat, poultry, or fish each day. A 3-oz (85-g) serving of meat is about the same size as a deck of cards. One egg equals 1 oz (28 g). ? 2-3 servings of low-fat dairy each day. One serving is 1 cup (237 mL). ? 1 serving of nuts, seeds, or beans 5 times each week. ? 2-3 servings of heart-healthy fats. Healthy fats called omega-3 fatty acids are found in foods such as walnuts, flaxseeds, fortified milks, and eggs. These fats are also found in cold-water fish, such as sardines, salmon, and mackerel.  Limit how much you eat of: ? Canned or prepackaged foods. ? Food that is high in trans fat, such as some fried foods. ? Food that is high in saturated fat, such as fatty meat. ? Desserts and other sweets, sugary drinks, and other foods with added sugar. ? Full-fat dairy products.  Do not salt foods before eating.  Do not eat more than 4 egg yolks a week.  Try to eat at least 2 vegetarian meals a week.  Eat more home-cooked food and less restaurant, buffet, and fast food.   Lifestyle  When eating at a restaurant, ask that your food be prepared with less salt or no salt, if possible.  If you drink alcohol: ? Limit how much you use to:  0-1 drink a day for women who are not pregnant.  0-2 drinks a day for men. ? Be  aware of how much alcohol is in your drink. In the [place] one drink equals one 12 oz bottle of beer (355 mL), one 5 oz glass of wine (148 mL), or one 1 oz glass of hard liquor (44 mL). General information  Avoid eating more than 2,300 mg of salt a day. If you have hypertension, you may need to reduce your sodium intake to 1,500 mg a day.  Work with your health care provider to maintain a healthy body weight or to lose weight. Ask what an ideal weight is for you.  Get at least 30 minutes of exercise that causes your heart to beat  faster (aerobic exercise) most days of the week. Activities may include walking, swimming, or biking.  Work with your health care provider or dietitian to adjust your eating plan to your individual calorie needs. What foods should I eat? Fruits All fresh, dried, or frozen fruit. Canned fruit in natural juice (without added sugar). Vegetables Fresh or frozen vegetables (raw, steamed, roasted, or grilled). Low-sodium or reduced-sodium tomato and vegetable juice. Low-sodium or reduced-sodium tomato sauce and tomato paste. Low-sodium or reduced-sodium canned vegetables. Grains Whole-grain or whole-wheat bread. Whole-grain or whole-wheat pasta. Brown rice. Orpah Cobb. Bulgur. Whole-grain and low-sodium cereals. Pita bread. Low-fat, low-sodium crackers. Whole-wheat flour tortillas. Meats and other proteins Skinless chicken or [place]. Ground chicken or [place]. Pork with fat trimmed off. Fish and seafood. Egg whites. Dried beans, peas, or lentils. Unsalted nuts, nut butters, and seeds. Unsalted canned beans. Lean cuts of beef with fat trimmed off. Low-sodium, lean precooked or cured meat, such as sausages or meat loaves. Dairy Low-fat (1%) or fat-free (skim) milk. Reduced-fat, low-fat, or fat-free cheeses. Nonfat, low-sodium ricotta or cottage cheese. Low-fat or nonfat yogurt. Low-fat, low-sodium cheese. Fats and oils Soft margarine without trans fats. Vegetable oil. Reduced-fat, low-fat, or light mayonnaise and salad dressings (reduced-sodium). Canola, safflower, olive, avocado, soybean, and sunflower oils. Avocado. Seasonings and condiments Herbs. Spices. Seasoning mixes without salt. Other foods Unsalted popcorn and pretzels. Fat-free sweets. The items listed above may not be a complete list of foods and beverages you can eat. Contact a dietitian for more information. What foods should I avoid? Fruits Canned fruit in a light or heavy syrup. Fried fruit. Fruit in cream or butter  sauce. Vegetables Creamed or fried vegetables. Vegetables in a cheese sauce. Regular canned vegetables (not low-sodium or reduced-sodium). Regular canned tomato sauce and paste (not low-sodium or reduced-sodium). Regular tomato and vegetable juice (not low-sodium or reduced-sodium). Rosita Fire. Olives. Grains Baked goods made with fat, such as croissants, muffins, or some breads. Dry pasta or rice meal packs. Meats and other proteins Fatty cuts of meat. Ribs. Fried meat. Tomasa Blase. Bologna, salami, and other precooked or cured meats, such as sausages or meat loaves. Fat from the back of a pig (fatback). Bratwurst. Salted nuts and seeds. Canned beans with added salt. Canned or smoked fish. Whole eggs or egg yolks. Chicken or [place] with skin. Dairy Whole or 2% milk, cream, and half-and-half. Whole or full-fat cream cheese. Whole-fat or sweetened yogurt. Full-fat cheese. Nondairy creamers. Whipped toppings. Processed cheese and cheese spreads. Fats and oils Butter. Stick margarine. Lard. Shortening. Ghee. Bacon fat. Tropical oils, such as coconut, palm kernel, or palm oil. Seasonings and condiments Onion salt, garlic salt, seasoned salt, table salt, and sea salt. Worcestershire sauce. Tartar sauce. Barbecue sauce. Teriyaki sauce. Soy sauce, including reduced-sodium. Steak sauce. Canned and packaged gravies. Fish sauce. Oyster sauce. Cocktail sauce. Store-bought horseradish. Ketchup. Mustard. Meat  flavorings and tenderizers. Bouillon cubes. Hot sauces. Pre-made or packaged marinades. Pre-made or packaged taco seasonings. Relishes. Regular salad dressings. Other foods Salted popcorn and pretzels. The items listed above may not be a complete list of foods and beverages you should avoid. Contact a dietitian for more information. Where to find more information  National Heart, Lung, and Blood Institute: https://wilson-eaton.com/  American Heart Association: www.heart.org  Academy of Nutrition and Dietetics:  www.eatright.[employer]: www.kidney.org Summary  The DASH eating plan is a healthy eating plan that has been shown to reduce high blood pressure (hypertension). It may also reduce your risk for type 2 diabetes, heart disease, and stroke.  When on the DASH eating plan, aim to eat more fresh fruits and vegetables, whole grains, lean proteins, low-fat dairy, and heart-healthy fats.  With the DASH eating plan, you should limit salt (sodium) intake to 2,300 mg a day. If you have hypertension, you may need to reduce your sodium intake to 1,500 mg a day.  Work with your health care provider or dietitian to adjust your eating plan to your individual calorie needs. This information is not intended to replace advice given to you by your health care provider. Make sure you discuss any questions you have with your health care provider. Document Revised: 02/26/2019 Document Reviewed: 02/26/2019 Elsevier Patient Education  2021 [employer].

## 2020-07-11 NOTE — Progress Notes (Signed)
Patient ID: Joseph Sos., male    DOB: 26-Jul-1965, 55 y.o.   MRN: [id number]  This visit was conducted in person.  BP (!) 156/104 (BP Location: Right Arm, Cuff Size: Large)   Pulse 67   Temp 98 F (36.7 C) (Temporal)   Ht 6' 1.5" (1.867 m)   Wt 243 lb 3 oz (110.3 kg)   SpO2 97%   BMI 31.65 kg/m   BP Readings from Last 3 Encounters:  07/11/20 (!) 156/104  12/14/19 114/80  08/30/19 120/80   CC: 6 mo f/u visit  Subjective:   HPI: Joseph Galloway. is a 55 y.o. male presenting on 07/11/2020 for Follow-up (Here for 6 mo f/u.)   Father passed away from prostate and bone cancer.  More eating out recently.   DM - does not regularly check sugars. Compliant with antihyperglycemic regimen which includes: metformin 534m bid. Denies low sugars or hypoglycemic symptoms. Denies paresthesias. Last diabetic eye exam 11/2019. Pneumovax: 2006. Prevnar: not due. Glucometer brand: OneTouch Ultra Mini. DSME: completed.  Lab Results  Component Value Date   HGBA1C 6.0 (A) 07/11/2020   Diabetic Foot Exam - Simple   Simple Foot Form Diabetic Foot exam was performed with the following findings: Yes 07/11/2020  9:23 AM  Visual Inspection No deformities, no ulcerations, no other skin breakdown bilaterally: Yes Sensation Testing Intact to touch and monofilament testing bilaterally: Yes Pulse Check Posterior Tibialis and Dorsalis pulse intact bilaterally: Yes Comments    Lab Results  Component Value Date   MICROALBUR <0.7 11/16/2014    HTN - Compliant with current antihypertensive regimen of amlodipine 567mdaily, lisinopril 2018mid. Does not regularly check blood pressures at home. Poor diet recently. No low blood pressure readings or symptoms of dizziness/syncope. Denies HA, vision changes, CP/tightness, SOB, leg swelling.      Relevant past medical, surgical, family and social history reviewed and updated as indicated. Interim medical history since our last visit reviewed. Allergies  and medications reviewed and updated. Outpatient Medications Prior to Visit  Medication Sig Dispense Refill  . amLODipine (NORVASC) 5 MG tablet Take 1 tablet (5 mg total) by mouth daily. 30 tablet 11  . Ascorbic Acid (VITAMIN C) 1000 MG tablet Take 1 tablet (1,000 mg total) by mouth daily.    . aMarland Kitchenorvastatin (LIPITOR) 40 MG tablet Take 1 tablet (40 mg total) by mouth daily. 30 tablet 11  . azelastine (ASTELIN) 0.1 % nasal spray Place 2 sprays into both nostrils 2 (two) times daily. Use in each nostril as directed 30 mL 3  . Blood Pressure Monitoring (BLOOD PRESSURE MONITOR/L CUFF) MISC Use to monitor blood pressure as directed. 1 each 0  . cetirizine (ZYRTEC) 10 MG tablet Take 10 mg by mouth daily.    . Cholecalciferol (VITAMIN D3) 2000 units TABS Take 1 tablet by mouth daily.    . cyclobenzaprine (FLEXERIL) 10 MG tablet Take 0.5-1 tablets (5-10 mg total) by mouth 2 (two) times daily as needed for muscle spasms (sedation precautions). 30 tablet 0  . fluticasone (FLONASE) 50 MCG/ACT nasal spray Place 2 sprays into both nostrils daily. 16 g 3  . glucose blood test strip Use as instructed to check sugars daily and as needed 100 each 3  . lisinopril (ZESTRIL) 20 MG tablet Take 1 tablet (20 mg total) by mouth 2 (two) times daily. 60 tablet 11  . loratadine (CLARITIN) 10 MG tablet Take 10 mg by mouth daily. Takes during winter    .  metFORMIN (GLUCOPHAGE) 500 MG tablet Take 1 tablet (500 mg total) by mouth 2 (two) times daily with a meal. 60 tablet 11  . Multiple Vitamins-Minerals (CENTRUM MULTIGUMMIES PO) Take by mouth. 50+    . OneTouch Delica Lancets 95A MISC 1 each by Does not apply route daily. 250.00 100 each 3  . Blood Glucose Monitoring Suppl (ONE TOUCH ULTRA MINI) w/Device KIT 1 kit by Does not apply route as needed. 1 kit 0   No facility-administered medications prior to visit.     Per HPI unless specifically indicated in ROS section below Review of Systems Objective:  BP (!) 156/104 (BP  Location: Right Arm, Cuff Size: Large)   Pulse 67   Temp 98 F (36.7 C) (Temporal)   Ht 6' 1.5" (1.867 m)   Wt 243 lb 3 oz (110.3 kg)   SpO2 97%   BMI 31.65 kg/m   Wt Readings from Last 3 Encounters:  07/11/20 243 lb 3 oz (110.3 kg)  04/12/20 236 lb (107 kg)  12/14/19 238 lb 6 oz (108.1 kg)      Physical Exam Vitals and nursing note reviewed.  Constitutional:      Appearance: Normal appearance. He is well-developed. He is not ill-appearing.  Eyes:     General: No scleral icterus.    Extraocular Movements: Extraocular movements intact.     Conjunctiva/sclera: Conjunctivae normal.     Pupils: Pupils are equal, round, and reactive to light.  Cardiovascular:     Rate and Rhythm: Normal rate and regular rhythm.     Pulses: Normal pulses.     Heart sounds: Normal heart sounds. No murmur heard.   Pulmonary:     Effort: Pulmonary effort is normal. No respiratory distress.     Breath sounds: Normal breath sounds. No wheezing, rhonchi or rales.  Musculoskeletal:        General: Swelling present. No tenderness.     Cervical back: Normal range of motion and neck supple.     Right lower leg: No edema.     Left lower leg: No edema.     Comments:  See HPI for foot exam if done Fullness behind L knee  Lymphadenopathy:     Cervical: No cervical adenopathy.  Skin:    General: Skin is warm and dry.     Findings: No rash.  Neurological:     Mental Status: He is alert.       Results for orders placed or performed in visit on 07/11/20  POCT glycosylated hemoglobin (Hb A1C)  Result Value Ref Range   Hemoglobin A1C 6.0 (A) 4.0 - 5.6 %   HbA1c POC (<> result, manual entry)     HbA1c, POC (prediabetic range)     HbA1c, POC (controlled diabetic range)     Assessment & Plan:  This visit occurred during the SARS-CoV-2 public health emergency.  Safety protocols were in place, including screening questions prior to the visit, additional usage of staff PPE, and extensive cleaning of exam  room while observing appropriate contact time as indicated for disinfecting solutions.   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 Continue metformin.  Foot exam today.       Relevant Orders   POCT glycosylated hemoglobin (Hb A1C) (Completed)   Hypertension    Deteriorated - unclear cause Reviewed low sodium/salt diet, DASH handout provided.  He will start monitoring BP at home  and let me know readings - if persistently elevated will likely increase amlodipine to 31m daily. BP log card provided today.       Baker's cyst, left    Anticipate baker's cyst of left knee.  Discussed supplements which may help joint health.           Meds ordered this encounter  Medications  . Blood Glucose Monitoring Suppl (ONE TOUCH ULTRA MINI) w/Device KIT    Sig: 1 kit by Does not apply route as needed.    Dispense:  1 kit    Refill:  0   Orders Placed This Encounter  Procedures  . POCT glycosylated hemoglobin (Hb A1C)    Patient instructions: Your goal blood pressure is <140/90, ideally lower.  Work on low salt/sodium diet - goal <1.5gm (1,5039m per day. Eat a diet high in fruits/vegetables and whole grains.  Look into mediterranean and DASH diet. Goal activity is 15045mwk of moderate intensity exercise.  This can be split into 30 minute chunks.  If you are not at this level, you can start with smaller 10-15 min increments and slowly build up activity. Look at wwwPrairie Homeg for more resources   May try glucosamine, turmeric, vit D and/or fish oil for joints.   Follow up plan: Return in about 3 months (around 10/10/2020), or if symptoms worsen or fail to improve, for follow up visit.  JavRia BushD

## 2020-07-11 NOTE — Assessment & Plan Note (Addendum)
Chronic, stable. Continue metformin. Foot exam today. 

## 2020-07-11 NOTE — Assessment & Plan Note (Addendum)
Deteriorated - unclear cause Reviewed low sodium/salt diet, DASH handout provided.  He will start monitoring BP at home and let me know readings - if persistently elevated will likely increase amlodipine to 10mg  daily. BP log card provided today.

## 2020-07-17 ENCOUNTER — Telehealth: Payer: Self-pay | Admitting: Family Medicine

## 2020-07-17 NOTE — Telephone Encounter (Signed)
[facility] pharmacy called stating they received RX for one touch mini. She states that has been discontinued and the only meter they have is the OneTouch Ultra 2. She also states there was nothing sent over for lancets or test strips and she was unsure if patient only needed meter. Please advise

## 2020-07-18 NOTE — Telephone Encounter (Signed)
Lvm asking pt to call back.  Need pt to contact his insurance co to see what brand and model glucose meter they cover, since the OneTouch Mini has been discontinued.  Then we will send rx for new meter and supplies.  

## 2020-07-19 NOTE — Telephone Encounter (Signed)
Lvm asking pt to call back.  Need pt to contact his insurance co to see what brand and model glucose meter they cover, since the OneTouch Mini has been discontinued.  Then we will send rx for new meter and supplies.

## 2020-07-19 NOTE — Telephone Encounter (Signed)
Pt returning call.  I relayed message from pharmacy concerning glucometer.  Pt will contact his ins co and call back letting us know what brand and model meters they cover.

## 2020-10-17 ENCOUNTER — Ambulatory Visit: Payer: No Typology Code available for payment source | Admitting: Family Medicine

## 2020-11-07 ENCOUNTER — Other Ambulatory Visit: Payer: Self-pay

## 2020-11-08 ENCOUNTER — Ambulatory Visit (INDEPENDENT_AMBULATORY_CARE_PROVIDER_SITE_OTHER): Payer: No Typology Code available for payment source | Admitting: Family Medicine

## 2020-11-08 ENCOUNTER — Encounter: Payer: Self-pay | Admitting: Family Medicine

## 2020-11-08 ENCOUNTER — Other Ambulatory Visit: Payer: Self-pay

## 2020-11-08 VITALS — BP 134/84 | HR 68 | Temp 97.8°F | Ht 73.5 in | Wt 245.1 lb

## 2020-11-08 DIAGNOSIS — I10 Essential (primary) hypertension: Secondary | ICD-10-CM

## 2020-11-08 DIAGNOSIS — E118 Type 2 diabetes mellitus with unspecified complications: Secondary | ICD-10-CM

## 2020-11-08 MED ORDER — BLOOD GLUCOSE MONITOR KIT: PACK | 3 refills | Status: AC

## 2020-11-08 MED ORDER — AMLODIPINE BESYLATE 10 MG PO TABS: 10.0000 mg | ORAL_TABLET | Freq: Every day | ORAL | 11 refills | Status: DC

## 2020-11-08 NOTE — Assessment & Plan Note (Signed)
Improved but persistently above goal - will increase amlodipine to 10mg , continue lisinopril 20mg  bid.  Update with effect, watch for pedal edema side effect.

## 2020-11-08 NOTE — Assessment & Plan Note (Addendum)
Chronic, stable on metformin. Continue.  Generic glucometer sent to pharmacy.

## 2020-11-08 NOTE — Patient Instructions (Addendum)
Return in 3 months for physical.  Check with insurance or pharmacy for preferred glucometer brand.  BP is staying elevated - let's increase amlodipine to 10mg  daily. Let know if any trouble tolerating this.  We will recheck BP control at physical.

## 2020-11-08 NOTE — Progress Notes (Signed)
Patient ID: Joseph Sos., male    DOB: January 10, 1966, 55 y.o.   MRN: [id number]  This visit was conducted in person.  BP 134/84   Pulse 68   Temp 97.8 F (36.6 C) (Temporal)   Ht 6' 1.5" (1.867 m)   Wt 245 lb 1 oz (111.2 kg)   SpO2 97%   BMI 31.89 kg/m   With home BP cuff 157/109 On my recheck: 142/96  CC: HTN f/u visit  Subjective:   HPI: Joseph Galloway. is a 55 y.o. male presenting on 11/08/2020 for Hypertension (Here for 3 mo f/u.  Pt brought in home BP monitor to compare.  Reading in office today, 147/94.)   HTN - Compliant with current antihypertensive regimen of amlodipine 65m daily, lisinopril 247mbid. Has not been checking blood pressures at home. No low blood pressure readings or symptoms of dizziness/syncope. Denies HA, vision changes, CP/tightness, SOB, leg swelling.   DM - does not regularly check sugars. Compliant with antihyperglycemic regimen which includes: metformin 500106mid. Last diabetic eye exam 11/2019. Glucometer brand: unsure - needs to check with insurance. Last foot exam: 07/2020. DSME: completed. Lab Results  Component Value Date   HGBA1C 6.0 (A) 07/11/2020   Diabetic Foot Exam - Simple   No data filed    Lab Results  Component Value Date   MICROALBUR <0.7 11/16/2014       Relevant past medical, surgical, family and social history reviewed and updated as indicated. Interim medical history since our last visit reviewed. Allergies and medications reviewed and updated. Outpatient Medications Prior to Visit  Medication Sig Dispense Refill   Ascorbic Acid (VITAMIN C) 1000 MG tablet Take 1 tablet (1,000 mg total) by mouth daily.     atorvastatin (LIPITOR) 40 MG tablet Take 1 tablet (40 mg total) by mouth daily. 30 tablet 11   azelastine (ASTELIN) 0.1 % nasal spray Place 2 sprays into both nostrils 2 (two) times daily. Use in each nostril as directed 30 mL 3   Blood Glucose Monitoring Suppl (ONE TOUCH ULTRA MINI) w/Device KIT 1 kit by Does not  apply route as needed. 1 kit 0   Blood Pressure Monitoring (BLOOD PRESSURE MONITOR/L CUFF) MISC Use to monitor blood pressure as directed. 1 each 0   cetirizine (ZYRTEC) 10 MG tablet Take 10 mg by mouth daily.     Cholecalciferol (VITAMIN D3) 2000 units TABS Take 1 tablet by mouth daily.     cyclobenzaprine (FLEXERIL) 10 MG tablet Take 0.5-1 tablets (5-10 mg total) by mouth 2 (two) times daily as needed for muscle spasms (sedation precautions). 30 tablet 0   fluticasone (FLONASE) 50 MCG/ACT nasal spray Place 2 sprays into both nostrils daily. 16 g 3   glucose blood test strip Use as instructed to check sugars daily and as needed 100 each 3   lisinopril (ZESTRIL) 20 MG tablet Take 1 tablet (20 mg total) by mouth 2 (two) times daily. 60 tablet 11   loratadine (CLARITIN) 10 MG tablet Take 10 mg by mouth daily. Takes during winter     metFORMIN (GLUCOPHAGE) 500 MG tablet Take 1 tablet (500 mg total) by mouth 2 (two) times daily with a meal. 60 tablet 11   MISC NATURAL PRODUCTS PO Take 500 mg by mouth daily. Turmeric + ginger     Multiple Vitamins-Minerals (CENTRUM MULTIGUMMIES PO) Take by mouth. 50+00+  OneTouch Delica Lancets [id number] 1 each by Does not apply route daily.  250.00 100 each 3   vitamin B-12 (CYANOCOBALAMIN) 1000 MCG tablet Take 1,000 mcg by mouth daily.     amLODipine (NORVASC) 5 MG tablet Take 1 tablet (5 mg total) by mouth daily. 30 tablet 11   No facility-administered medications prior to visit.     Per HPI unless specifically indicated in ROS section below Review of Systems  Objective:  BP 134/84   Pulse 68   Temp 97.8 F (36.6 C) (Temporal)   Ht 6' 1.5" (1.867 m)   Wt 245 lb 1 oz (111.2 kg)   SpO2 97%   BMI 31.89 kg/m   Wt Readings from Last 3 Encounters:  11/08/20 245 lb 1 oz (111.2 kg)  07/11/20 243 lb 3 oz (110.3 kg)  04/12/20 236 lb (107 kg)      Physical Exam Vitals and nursing note reviewed.  Constitutional:      Appearance: Normal appearance. He is not  ill-appearing.  Eyes:     Extraocular Movements: Extraocular movements intact.     Pupils: Pupils are equal, round, and reactive to light.  Neck:     Thyroid: No thyroid mass or thyromegaly.  Cardiovascular:     Rate and Rhythm: Normal rate and regular rhythm.     Pulses: Normal pulses.     Heart sounds: Normal heart sounds. No murmur heard. Pulmonary:     Effort: Pulmonary effort is normal. No respiratory distress.     Breath sounds: Normal breath sounds. No wheezing, rhonchi or rales.  Musculoskeletal:     Cervical back: Normal range of motion and neck supple. No rigidity.     Right lower leg: No edema.     Left lower leg: No edema.  Lymphadenopathy:     Cervical: No cervical adenopathy.  Skin:    General: Skin is warm and dry.     Findings: No rash.  Neurological:     Mental Status: He is alert.  Psychiatric:        Mood and Affect: Mood normal.        Behavior: Behavior normal.      Results for orders placed or performed in visit on 07/11/20  POCT glycosylated hemoglobin (Hb A1C)  Result Value Ref Range   Hemoglobin A1C 6.0 (A) 4.0 - 5.6 %   HbA1c POC (<> result, manual entry)     HbA1c, POC (prediabetic range)     HbA1c, POC (controlled diabetic range)      Assessment & Plan:  This visit occurred during the SARS-CoV-2 public health emergency.  Safety protocols were in place, including screening questions prior to the visit, additional usage of staff PPE, and extensive cleaning of exam room while observing appropriate contact time as indicated for disinfecting solutions.   Problem List Items Addressed This Visit     Controlled diabetes mellitus type 2 with complications (HCC)    Chronic, stable on metformin. Continue.  Generic glucometer sent to pharmacy.        Hypertension - Primary    Improved but persistently above goal - will increase amlodipine to 18m, continue lisinopril 250mbid.  Update with effect, watch for pedal edema side effect.         Relevant Medications   amLODipine (NORVASC) 10 MG tablet     Meds ordered this encounter  Medications   blood glucose meter kit and supplies KIT    Sig: Dispense based on patient and insurance preference. Use to check sugars once daily. E11.8    Dispense:  1 each    Refill:  3    Order Specific Question:   Number of strips    Answer:   100    Order Specific Question:   Number of lancets    Answer:   100   amLODipine (NORVASC) 10 MG tablet    Sig: Take 1 tablet (10 mg total) by mouth daily.    Dispense:  30 tablet    Refill:  11   No orders of the defined types were placed in this encounter.   Patient Instructions  Return in 3 months for physical.  Check with insurance or pharmacy for preferred glucometer brand.  BP is staying elevated - let's increase amlodipine to 10m daily. Let [place] if any trouble tolerating this.  We will recheck BP control at physical.   Follow up plan: Return in about 3 months (around 02/08/2021) for annual exam, prior fasting for blood work.  JRia Bush MD

## 2020-12-05 LAB — HM DIABETES EYE EXAM

## 2021-01-04 ENCOUNTER — Other Ambulatory Visit: Payer: Self-pay | Admitting: Family Medicine

## 2021-02-24 ENCOUNTER — Other Ambulatory Visit: Payer: Self-pay | Admitting: Family Medicine

## 2021-02-24 DIAGNOSIS — E118 Type 2 diabetes mellitus with unspecified complications: Secondary | ICD-10-CM

## 2021-02-24 DIAGNOSIS — E1169 Type 2 diabetes mellitus with other specified complication: Secondary | ICD-10-CM

## 2021-02-24 DIAGNOSIS — Z1159 Encounter for screening for other viral diseases: Secondary | ICD-10-CM

## 2021-02-24 DIAGNOSIS — Z125 Encounter for screening for malignant neoplasm of prostate: Secondary | ICD-10-CM

## 2021-02-24 DIAGNOSIS — E213 Hyperparathyroidism, unspecified: Secondary | ICD-10-CM

## 2021-02-24 DIAGNOSIS — E559 Vitamin D deficiency, unspecified: Secondary | ICD-10-CM

## 2021-02-27 ENCOUNTER — Other Ambulatory Visit: Payer: No Typology Code available for payment source

## 2021-03-07 ENCOUNTER — Encounter: Payer: Self-pay | Admitting: Family Medicine

## 2021-03-07 ENCOUNTER — Ambulatory Visit (INDEPENDENT_AMBULATORY_CARE_PROVIDER_SITE_OTHER): Payer: No Typology Code available for payment source | Admitting: Family Medicine

## 2021-03-07 ENCOUNTER — Other Ambulatory Visit: Payer: Self-pay

## 2021-03-07 VITALS — BP 130/84 | HR 65 | Temp 97.9°F | Ht 72.25 in | Wt 246.1 lb

## 2021-03-07 DIAGNOSIS — Z114 Encounter for screening for human immunodeficiency virus [HIV]: Secondary | ICD-10-CM

## 2021-03-07 DIAGNOSIS — Z1159 Encounter for screening for other viral diseases: Secondary | ICD-10-CM

## 2021-03-07 DIAGNOSIS — Z0000 Encounter for general adult medical examination without abnormal findings: Secondary | ICD-10-CM | POA: Diagnosis not present

## 2021-03-07 DIAGNOSIS — Z23 Encounter for immunization: Secondary | ICD-10-CM | POA: Diagnosis not present

## 2021-03-07 DIAGNOSIS — E118 Type 2 diabetes mellitus with unspecified complications: Secondary | ICD-10-CM | POA: Diagnosis not present

## 2021-03-07 DIAGNOSIS — E559 Vitamin D deficiency, unspecified: Secondary | ICD-10-CM

## 2021-03-07 DIAGNOSIS — E213 Hyperparathyroidism, unspecified: Secondary | ICD-10-CM

## 2021-03-07 DIAGNOSIS — Z125 Encounter for screening for malignant neoplasm of prostate: Secondary | ICD-10-CM | POA: Diagnosis not present

## 2021-03-07 DIAGNOSIS — Z1211 Encounter for screening for malignant neoplasm of colon: Secondary | ICD-10-CM | POA: Diagnosis not present

## 2021-03-07 DIAGNOSIS — E785 Hyperlipidemia, unspecified: Secondary | ICD-10-CM

## 2021-03-07 DIAGNOSIS — E1169 Type 2 diabetes mellitus with other specified complication: Secondary | ICD-10-CM | POA: Diagnosis not present

## 2021-03-07 DIAGNOSIS — I10 Essential (primary) hypertension: Secondary | ICD-10-CM

## 2021-03-07 DIAGNOSIS — E669 Obesity, unspecified: Secondary | ICD-10-CM

## 2021-03-07 DIAGNOSIS — R221 Localized swelling, mass and lump, neck: Secondary | ICD-10-CM

## 2021-03-07 LAB — COMPREHENSIVE METABOLIC PANEL
ALT: 34 U/L (ref 0–53)
AST: 27 U/L (ref 0–37)
Albumin: 4.3 g/dL (ref 3.5–5.2)
Alkaline Phosphatase: 46 U/L (ref 39–117)
BUN: 31 mg/dL — ABNORMAL HIGH (ref 6–23)
CO2: 27 mEq/L (ref 19–32)
Calcium: 10.5 mg/dL (ref 8.4–10.5)
Chloride: 104 mEq/L (ref 96–112)
Creatinine, Ser: 1.61 mg/dL — ABNORMAL HIGH (ref 0.40–1.50)
GFR: 48.67 mL/min — ABNORMAL LOW (ref >60.00)
Glucose, Bld: 96 mg/dL (ref 70–99)
Potassium: 4.4 mEq/L (ref 3.5–5.1)
Sodium: 138 mEq/L (ref 135–145)
Total Bilirubin: 0.5 mg/dL (ref 0.2–1.2)
Total Protein: 7 g/dL (ref 6.0–8.3)

## 2021-03-07 LAB — PSA: PSA: 1.71 ng/mL (ref 0.10–4.00)

## 2021-03-07 LAB — LIPID PANEL
Cholesterol: 123 mg/dL (ref 0–200)
HDL: 38.1 mg/dL — ABNORMAL LOW (ref >39.00)
LDL Cholesterol: 69 mg/dL (ref 0–99)
NonHDL: 85
Total CHOL/HDL Ratio: 3
Triglycerides: 81 mg/dL (ref 0.0–149.0)
VLDL: 16.2 mg/dL (ref 0.0–40.0)

## 2021-03-07 LAB — VITAMIN D 25 HYDROXY (VIT D DEFICIENCY, FRACTURES): VITD: 44.95 ng/mL (ref 30.00–100.00)

## 2021-03-07 LAB — HEMOGLOBIN A1C: Hgb A1c MFr Bld: 6.8 % — ABNORMAL HIGH (ref 4.6–6.5)

## 2021-03-07 MED ORDER — LISINOPRIL 20 MG PO TABS
20.0000 mg | ORAL_TABLET | Freq: Two times a day (BID) | ORAL | 3 refills | Status: DC
Start: 2021-03-07 — End: 2022-03-13

## 2021-03-07 MED ORDER — ATORVASTATIN CALCIUM 40 MG PO TABS: 40.0000 mg | ORAL_TABLET | Freq: Every day | ORAL | 3 refills | Status: DC

## 2021-03-07 MED ORDER — AMLODIPINE BESYLATE 10 MG PO TABS: 10.0000 mg | ORAL_TABLET | Freq: Every day | ORAL | 3 refills | Status: DC

## 2021-03-07 MED ORDER — AZELASTINE HCL 0.1 % NA SOLN: 2.0000 | Freq: Two times a day (BID) | NASAL | 3 refills | Status: DC

## 2021-03-07 MED ORDER — METFORMIN HCL 500 MG PO TABS: 500.0000 mg | ORAL_TABLET | Freq: Two times a day (BID) | ORAL | 3 refills | Status: DC

## 2021-03-07 NOTE — Assessment & Plan Note (Signed)
S/p ENT eval 2014 [facility]ane) - benign R submandibular salivary gland rec f/u if changing

## 2021-03-07 NOTE — Assessment & Plan Note (Signed)
Chronic, stable on metformin - continue.  ?

## 2021-03-07 NOTE — Assessment & Plan Note (Signed)
Encouraged healthy diet and lifestyle choices to affect sustainable weight loss.  ?

## 2021-03-07 NOTE — Assessment & Plan Note (Addendum)
Chronic, mild. Update PTH. Normal kidney function ?related to vit D def.  In h/o hypercalcemia, avoid HCTZ.

## 2021-03-07 NOTE — Assessment & Plan Note (Signed)
Chronic, stable. Continue amlodipine and lisinopril.  ?

## 2021-03-07 NOTE — Assessment & Plan Note (Signed)
Continue 2000 IU daily, update vitamin D levels.

## 2021-03-07 NOTE — Assessment & Plan Note (Signed)
Chronic on atorvastatin - update FLP. The ASCVD Risk score (Arnett DK, et al., 2019) failed to calculate for the following reasons:   The valid total cholesterol range is 130 to 320 mg/dL

## 2021-03-07 NOTE — Progress Notes (Signed)
Patient ID: Joseph Sos., male    DOB: Sep 05, 1965, 55 y.o.   MRN: [id number]  This visit was conducted in person.  BP 130/84 (BP Location: Right Arm, Cuff Size: Large)   Pulse 65   Temp 97.9 F (36.6 C) (Temporal)   Ht 6' 0.25" (1.835 m)   Wt 246 lb 2 oz (111.6 kg)   SpO2 96%   BMI 33.15 kg/m    CC: CPE Subjective:   HPI: Joseph Galloway. is a 55 y.o. male presenting on 03/07/2021 for Annual Exam   HTN - not checking at home.  DM - fasting 97 this morning.  Exercising 3d/wk at gym, goes with friend.   Preventative: Colon cancer screening - will refer for colonoscopy.  Fmhx prostate cancer in father - No nocturia, no changes in stream. PSA yearly Lung cancer screening - not eligible  Flu shot yearly COVID vaccine - Ziebach x2 07/2019  Tdap 2013 Pneumovax 2006.  Shingrix - discussed. Will defer for now.  Seat belt use discussed.  Sunscreen use discussed. No changing moles on skin.  Sleep - averaging 5-7 hours/night Non smoker  Alcohol - rare Dentist/periodontist 4 times a year (gum disease) Eye exam yearly   Caffeine: rare. 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  Diet: lots of water throughout the day, some fruits/vegetables, watches carbs and starches      Relevant past medical, surgical, family and social history reviewed and updated as indicated. Interim medical history since our last visit reviewed. Allergies and medications reviewed and updated. Outpatient Medications Prior to Visit  Medication Sig Dispense Refill   Ascorbic Acid (VITAMIN C) 500 MG CHEW Chew by mouth daily.     blood glucose meter kit and supplies KIT Dispense based on patient and insurance preference. Use to check sugars once daily. E11.8 1 each 3   Blood Glucose Monitoring Suppl (ONE TOUCH ULTRA MINI) w/Device KIT 1 kit by Does not apply route as  needed. 1 kit 0   Blood Pressure Monitoring (BLOOD PRESSURE MONITOR/L CUFF) MISC Use to monitor blood pressure as directed. 1 each 0   cetirizine (ZYRTEC) 10 MG tablet Take 10 mg by mouth daily.     Cholecalciferol (VITAMIN D3) 2000 units TABS Take 1 tablet by mouth daily.     cyclobenzaprine (FLEXERIL) 10 MG tablet Take 0.5-1 tablets (5-10 mg total) by mouth 2 (two) times daily as needed for muscle spasms (sedation precautions). 30 tablet 0   fluticasone (FLONASE) 50 MCG/ACT nasal spray Place 2 sprays into both nostrils daily. 16 g 3   glucose blood test strip Use as instructed to check sugars daily and as needed 100 each 3   loratadine (CLARITIN) 10 MG tablet Take 10 mg by mouth daily. Takes during winter     MISC NATURAL PRODUCTS PO Take 500 mg by mouth daily. Turmeric + ginger     Multiple Vitamins-Minerals (CENTRUM MULTIGUMMIES PO) Take by mouth. 44+     OneTouch Delica Lancets 01U MISC 1 each by Does not apply route daily. 250.00 100 each 3   vitamin B-12 (CYANOCOBALAMIN) 1000 MCG tablet Take 1,000 mcg by mouth daily.     amLODipine (NORVASC) 10 MG tablet Take 1 tablet (10 mg total) by mouth daily. 30 tablet 11   atorvastatin (LIPITOR) 40 MG tablet Take 1 tablet by mouth once daily 30  tablet 2   azelastine (ASTELIN) 0.1 % nasal spray Place 2 sprays into both nostrils 2 (two) times daily. Use in each nostril as directed 30 mL 3   lisinopril (ZESTRIL) 20 MG tablet Take 1 tablet by mouth twice daily 60 tablet 2   metFORMIN (GLUCOPHAGE) 500 MG tablet TAKE 1 TABLET BY MOUTH TWICE DAILY WITH A MEAL 60 tablet 2   Ascorbic Acid (VITAMIN C) 1000 MG tablet Take 1 tablet (1,000 mg total) by mouth daily.     No facility-administered medications prior to visit.     Per HPI unless specifically indicated in ROS section below Review of Systems  Constitutional:  Negative for activity change, appetite change, chills, fatigue, fever and unexpected weight change.  HENT:  Negative for hearing loss.    Eyes:  Negative for visual disturbance.  Respiratory:  Negative for cough, chest tightness, shortness of breath and wheezing.   Cardiovascular:  Negative for chest pain, palpitations and leg swelling.  Gastrointestinal:  Negative for abdominal distention, abdominal pain, blood in stool, constipation, diarrhea, nausea and vomiting.  Genitourinary:  Negative for difficulty urinating and hematuria.  Musculoskeletal:  Negative for arthralgias, myalgias and neck pain.  Skin:  Negative for rash.  Neurological:  Negative for dizziness, seizures, syncope and headaches.  Hematological:  Negative for adenopathy. Does not bruise/bleed easily.  Psychiatric/Behavioral:  Negative for dysphoric mood. The patient is not nervous/anxious.    Objective:  BP 130/84 (BP Location: Right Arm, Cuff Size: Large)   Pulse 65   Temp 97.9 F (36.6 C) (Temporal)   Ht 6' 0.25" (1.835 m)   Wt 246 lb 2 oz (111.6 kg)   SpO2 96%   BMI 33.15 kg/m   Wt Readings from Last 3 Encounters:  03/07/21 246 lb 2 oz (111.6 kg)  11/08/20 245 lb 1 oz (111.2 kg)  07/11/20 243 lb 3 oz (110.3 kg)      Physical Exam Vitals and nursing note reviewed.  Constitutional:      General: He is not in acute distress.    Appearance: Normal appearance. He is well-developed. He is not ill-appearing.  HENT:     Head: Normocephalic and atraumatic.     Right Ear: Hearing, tympanic membrane, ear canal and external ear normal.     Left Ear: Hearing, tympanic membrane, ear canal and external ear normal.  Eyes:     General: No scleral icterus.    Extraocular Movements: Extraocular movements intact.     Conjunctiva/sclera: Conjunctivae normal.     Pupils: Pupils are equal, round, and reactive to light.  Neck:     Thyroid: No thyroid mass or thyromegaly.  Cardiovascular:     Rate and Rhythm: Normal rate and regular rhythm.     Pulses: Normal pulses.          Radial pulses are 2+ on the right side and 2+ on the left side.     Heart sounds:  Normal heart sounds. No murmur heard. Pulmonary:     Effort: Pulmonary effort is normal. No respiratory distress.     Breath sounds: Normal breath sounds. No wheezing, rhonchi or rales.  Abdominal:     General: Bowel sounds are normal. There is no distension.     Palpations: Abdomen is soft. There is no mass.     Tenderness: There is no abdominal tenderness. There is no guarding or rebound.     Hernia: No hernia is present.  Musculoskeletal:        General: Normal  range of motion.     Cervical back: Normal range of motion and neck supple.     Right lower leg: No edema.     Left lower leg: No edema.  Lymphadenopathy:     Cervical: No cervical adenopathy.  Skin:    General: Skin is warm and dry.     Findings: No rash.  Neurological:     General: No focal deficit present.     Mental Status: He is alert and oriented to person, place, and time.  Psychiatric:        Mood and Affect: Mood normal.        Behavior: Behavior normal.        Thought Content: Thought content normal.        Judgment: Judgment normal.      Results for orders placed or performed in visit on 07/11/20  POCT glycosylated hemoglobin (Hb A1C)  Result Value Ref Range   Hemoglobin A1C 6.0 (A) 4.0 - 5.6 %   HbA1c POC (<> result, manual entry)     HbA1c, POC (prediabetic range)     HbA1c, POC (controlled diabetic range)      Assessment & Plan:  This visit occurred during the SARS-CoV-2 public health emergency.  Safety protocols were in place, including screening questions prior to the visit, additional usage of staff PPE, and extensive cleaning of exam room while observing appropriate contact time as indicated for disinfecting solutions.   Problem List Items Addressed This Visit     Health maintenance examination - Primary (Chronic)    Preventative protocols reviewed and updated unless pt declined. Discussed healthy diet and lifestyle.       Controlled diabetes mellitus type 2 with complications (HCC)     Chronic, stable on metformin - continue.      Relevant Medications   atorvastatin (LIPITOR) 40 MG tablet   lisinopril (ZESTRIL) 20 MG tablet   metFORMIN (GLUCOPHAGE) 500 MG tablet   Hyperlipidemia associated with type 2 diabetes mellitus (HCC)    Chronic on atorvastatin - update FLP. The ASCVD Risk score (Arnett DK, et al., 2019) failed to calculate for the following reasons:   The valid total cholesterol range is 130 to 320 mg/dL       Relevant Medications   atorvastatin (LIPITOR) 40 MG tablet   lisinopril (ZESTRIL) 20 MG tablet   metFORMIN (GLUCOPHAGE) 500 MG tablet   amLODipine (NORVASC) 10 MG tablet   Obesity, Class I, BMI 30-34.9    Encouraged healthy diet and lifestyle choices to affect sustainable weight loss.       Hypertension    Chronic, stable. Continue amlodipine and lisinopril.       Relevant Medications   atorvastatin (LIPITOR) 40 MG tablet   lisinopril (ZESTRIL) 20 MG tablet   amLODipine (NORVASC) 10 MG tablet   Mass of right side of neck    S/p ENT eval 2014 [place] [facility]) - benign R submandibular salivary gland rec f/u if changing      Vitamin D deficiency    Continue 2000 IU daily, update vitamin D levels.       Hyperparathyroidism (HCC)    Chronic, mild. Update PTH. Normal kidney function ?related to vit D def.  In h/o hypercalcemia, avoid HCTZ.       Other Visit Diagnoses     Need for influenza vaccination       Relevant Orders   Flu Vaccine QUAD 45moIM (Fluarix, Fluzone & Alfiuria Quad PF) (Completed)   Special screening for  malignant neoplasms, colon       Relevant Orders   Ambulatory referral to Gastroenterology   Need for hepatitis C screening test       Special screening for malignant neoplasm of prostate       Encounter for screening for HIV       Relevant Orders   HIV antibody (with reflex)        Meds ordered this encounter  Medications   atorvastatin (LIPITOR) 40 MG tablet    Sig: Take 1 tablet (40 mg total) by mouth daily.     Dispense:  90 tablet    Refill:  3   lisinopril (ZESTRIL) 20 MG tablet    Sig: Take 1 tablet (20 mg total) by mouth 2 (two) times daily.    Dispense:  180 tablet    Refill:  3   metFORMIN (GLUCOPHAGE) 500 MG tablet    Sig: Take 1 tablet (500 mg total) by mouth 2 (two) times daily with a meal.    Dispense:  180 tablet    Refill:  3   amLODipine (NORVASC) 10 MG tablet    Sig: Take 1 tablet (10 mg total) by mouth daily.    Dispense:  90 tablet    Refill:  3   azelastine (ASTELIN) 0.1 % nasal spray    Sig: Place 2 sprays into both nostrils 2 (two) times daily. Use in each nostril as directed    Dispense:  30 mL    Refill:  3    Orders Placed This Encounter  Procedures   Flu Vaccine QUAD 69moIM (Fluarix, Fluzone & Alfiuria Quad PF)   HIV antibody (with reflex)   Ambulatory referral to Gastroenterology    Referral Priority:   Routine    Referral Type:   Consultation    Referral Reason:   Specialty Services Required    Number of Visits Requested:   1    Patient instructions: Flu shot today  Labs today  Consider shingrix vaccine series.  You are doing well today  Continue regular exercise routine.  Return as needed or in 6 months for follow up visit.   Follow up plan: Return in about 6 months (around 09/04/2021) for follow up visit.  JRia Bush MD

## 2021-03-07 NOTE — Patient Instructions (Addendum)
Flu shot today  Labs today  Consider shingrix vaccine series.  You are doing well today  Continue regular exercise routine.  Return as needed or in 6 months for follow up visit.   Health Maintenance, Male Adopting a healthy lifestyle and getting preventive care are important in promoting health and wellness. Ask your health care provider about: The right schedule for you to have regular tests and exams. Things you can do on your own to prevent diseases and keep yourself healthy. What should I know about diet, weight, and exercise? Eat a healthy diet  Eat a diet that includes plenty of vegetables, fruits, low-fat dairy products, and lean protein. Do not eat a lot of foods that are high in solid fats, added sugars, or sodium. Maintain a healthy weight Body mass index (BMI) is a measurement that can be used to identify possible weight problems. It estimates body fat based on height and weight. Your health care provider can help determine your BMI and help you achieve or maintain a healthy weight. Get regular exercise Get regular exercise. This is one of the most important things you can do for your health. Most adults should: Exercise for at least 150 minutes each week. The exercise should increase your heart rate and make you sweat (moderate-intensity exercise). Do strengthening exercises at least twice a week. This is in addition to the moderate-intensity exercise. Spend less time sitting. Even light physical activity can be beneficial. Watch cholesterol and blood lipids Have your blood tested for lipids and cholesterol at 52 years of age, then have this test every 5 years. You may need to have your cholesterol levels checked more often if: Your lipid or cholesterol levels are high. You are older than 53 years of age. You are at high risk for heart disease. What should I know about cancer screening? Many types of cancers can be detected early and may often be prevented. Depending on your  health history and family history, you may need to have cancer screening at various ages. This may include screening for: Colorectal cancer. Prostate cancer. Skin cancer. Lung cancer. What should I know about heart disease, diabetes, and high blood pressure? Blood pressure and heart disease High blood pressure causes heart disease and increases the risk of stroke. This is more likely to develop in people who have high blood pressure readings or are overweight. Talk with your health care provider about your target blood pressure readings. Have your blood pressure checked: Every 3-5 years if you are 49-33 years of age. Every year if you are 69 years old or older. If you are between the ages of 36 and 69 and are a current or former smoker, ask your health care provider if you should have a one-time screening for abdominal aortic aneurysm (AAA). Diabetes Have regular diabetes screenings. This checks your fasting blood sugar level. Have the screening done: Once every three years after age 37 if you are at a normal weight and have a low risk for diabetes. More often and at a younger age if you are overweight or have a high risk for diabetes. What should I know about preventing infection? Hepatitis B If you have a higher risk for hepatitis B, you should be screened for this virus. Talk with your health care provider to find out if you are at risk for hepatitis B infection. Hepatitis C Blood testing is recommended for: Everyone born from [birth year] through 1963. Anyone with known risk factors for hepatitis C. Sexually transmitted infections (  STIs) You should be screened each year for STIs, including gonorrhea and chlamydia, if: You are sexually active and are younger than 53 years of age. You are older than 53 years of age and your health care provider tells you that you are at risk for this type of infection. Your sexual activity has changed since you were last screened, and you are at increased risk  for chlamydia or gonorrhea. Ask your health care provider if you are at risk. Ask your health care provider about whether you are at high risk for HIV. Your health care provider may recommend a prescription medicine to help prevent HIV infection. If you choose to take medicine to prevent HIV, you should first get tested for HIV. You should then be tested every 3 months for as long as you are taking the medicine. Follow these instructions at home: Alcohol use Do not drink alcohol if your health care provider tells you not to drink. If you drink alcohol: Limit how much you have to 0-2 drinks a day. Know how much alcohol is in your drink. In the [place] one drink equals one 12 oz bottle of beer (355 mL), one 5 oz glass of wine (148 mL), or one 1 oz glass of hard liquor (44 mL). Lifestyle Do not use any products that contain nicotine or tobacco. These products include cigarettes, chewing tobacco, and vaping devices, such as e-cigarettes. If you need help quitting, ask your health care provider. Do not use street drugs. Do not share needles. Ask your health care provider for help if you need support or information about quitting drugs. General instructions Schedule regular health, dental, and eye exams. Stay current with your vaccines. Tell your health care provider if: You often feel depressed. You have ever been abused or do not feel safe at home. Summary Adopting a healthy lifestyle and getting preventive care are important in promoting health and wellness. Follow your health care provider's instructions about healthy diet, exercising, and getting tested or screened for diseases. Follow your health care provider's instructions on monitoring your cholesterol and blood pressure. This information is not intended to replace advice given to you by your health care provider. Make sure you discuss any questions you have with your health care provider. Document Revised: 08/14/2020 Document Reviewed:  08/14/2020 Elsevier Patient Education  2022 [employer].

## 2021-03-07 NOTE — Assessment & Plan Note (Signed)
Preventative protocols reviewed and updated unless pt declined. Discussed healthy diet and lifestyle.  

## 2021-03-08 LAB — HIV ANTIBODY (ROUTINE TESTING W REFLEX): HIV 1&2 Ab, 4th Generation: NONREACTIVE

## 2021-03-08 LAB — HEPATITIS C ANTIBODY
Hepatitis C Ab: NONREACTIVE
SIGNAL TO CUT-OFF: 0.04 (ref <1.00)

## 2021-03-08 LAB — PARATHYROID HORMONE, INTACT (NO CA): PTH: 77 pg/mL (ref 16–77)

## 2021-03-15 ENCOUNTER — Telehealth: Payer: Self-pay | Admitting: Family Medicine

## 2021-03-15 ENCOUNTER — Telehealth: Payer: Self-pay

## 2021-03-15 NOTE — Telephone Encounter (Signed)
Spoke with pt on 03/13/21. (see Labs Result Notes, 03/07/21)

## 2021-03-15 NOTE — Telephone Encounter (Signed)
[facility] Night - Client Nonclinical Telephone Record  [facility] Client [facility] Night - Client Client Site [facility] - Night Provider Eustaquio Boyden - MD Contact Type Call Who Is Calling Patient / Member / Family / Caregiver Caller Name Joseph Galloway Caller Phone Number [phone] Call Type Message Only Information Provided Reason for Call Returning a Call from the Office Initial Comment Caller states, returning a call from [place]. Disp. Time Disposition Final User 03/14/2021 5:17:10 PM General Information Provided Yes Merrily Pew Call Closed By: Merrily Pew Transaction Date/Time: 03/14/2021 5:16:06 PM (ET

## 2021-03-15 NOTE — Telephone Encounter (Signed)
Pt called stating that you called. Pt returning call.

## 2021-06-11 ENCOUNTER — Other Ambulatory Visit: Payer: Self-pay | Admitting: Family Medicine

## 2021-06-11 ENCOUNTER — Other Ambulatory Visit: Payer: Self-pay

## 2021-06-11 ENCOUNTER — Other Ambulatory Visit (INDEPENDENT_AMBULATORY_CARE_PROVIDER_SITE_OTHER): Payer: No Typology Code available for payment source

## 2021-06-11 DIAGNOSIS — N289 Disorder of kidney and ureter, unspecified: Secondary | ICD-10-CM

## 2021-06-11 LAB — RENAL FUNCTION PANEL
Albumin: 4.1 g/dL (ref 3.5–5.2)
BUN: 28 mg/dL — ABNORMAL HIGH (ref 6–23)
CO2: 26 mEq/L (ref 19–32)
Calcium: 10.1 mg/dL (ref 8.4–10.5)
Chloride: 103 mEq/L (ref 96–112)
Creatinine, Ser: 1.57 mg/dL — ABNORMAL HIGH (ref 0.40–1.50)
GFR: 50.07 mL/min — ABNORMAL LOW (ref >60.00)
Glucose, Bld: 128 mg/dL — ABNORMAL HIGH (ref 70–99)
Phosphorus: 3.4 mg/dL (ref 2.3–4.6)
Potassium: 4.2 mEq/L (ref 3.5–5.1)
Sodium: 138 mEq/L (ref 135–145)

## 2021-06-11 LAB — CBC WITH DIFFERENTIAL/PLATELET
Basophils Absolute: 0 10*3/uL (ref 0.0–0.1)
Basophils Relative: 0.5 % (ref 0.0–3.0)
Eosinophils Absolute: 0.2 10*3/uL (ref 0.0–0.7)
Eosinophils Relative: 3.1 % (ref 0.0–5.0)
HCT: 36.5 % — ABNORMAL LOW (ref 39.0–52.0)
Hemoglobin: 12.5 g/dL — ABNORMAL LOW (ref 13.0–17.0)
Lymphocytes Relative: 25.7 % (ref 12.0–46.0)
Lymphs Abs: 1.7 10*3/uL (ref 0.7–4.0)
MCHC: 34.3 g/dL (ref 30.0–36.0)
MCV: 95.2 fl (ref 78.0–100.0)
Monocytes Absolute: 0.6 10*3/uL (ref 0.1–1.0)
Monocytes Relative: 8.6 % (ref 3.0–12.0)
Neutro Abs: 4.2 10*3/uL (ref 1.4–7.7)
Neutrophils Relative %: 62.1 % (ref 43.0–77.0)
Platelets: 267 10*3/uL (ref 150.0–400.0)
RBC: 3.84 Mil/uL — ABNORMAL LOW (ref 4.22–5.81)
RDW: 13.1 % (ref 11.5–15.5)
WBC: 6.7 10*3/uL (ref 4.0–10.5)

## 2021-06-11 LAB — MICROALBUMIN / CREATININE URINE RATIO
Creatinine,U: 96.3 mg/dL
Microalb Creat Ratio: 0.7 mg/g (ref 0.0–30.0)
Microalb, Ur: <0.7 mg/dL (ref 0.0–1.9)

## 2021-06-18 ENCOUNTER — Other Ambulatory Visit: Payer: Self-pay | Admitting: Family Medicine

## 2021-06-18 DIAGNOSIS — E1122 Type 2 diabetes mellitus with diabetic chronic kidney disease: Secondary | ICD-10-CM

## 2021-06-18 DIAGNOSIS — N183 Chronic kidney disease, stage 3 unspecified: Secondary | ICD-10-CM | POA: Insufficient documentation

## 2021-06-23 ENCOUNTER — Encounter: Payer: Self-pay | Admitting: *Deleted

## 2021-09-11 ENCOUNTER — Encounter: Payer: Self-pay | Admitting: Family Medicine

## 2021-09-11 ENCOUNTER — Ambulatory Visit (INDEPENDENT_AMBULATORY_CARE_PROVIDER_SITE_OTHER): Payer: No Typology Code available for payment source | Admitting: Family Medicine

## 2021-09-11 VITALS — BP 142/80 | HR 57 | Temp 97.5°F | Ht 72.25 in | Wt 248.2 lb

## 2021-09-11 DIAGNOSIS — N183 Chronic kidney disease, stage 3 unspecified: Secondary | ICD-10-CM | POA: Diagnosis not present

## 2021-09-11 DIAGNOSIS — D649 Anemia, unspecified: Secondary | ICD-10-CM

## 2021-09-11 DIAGNOSIS — I10 Essential (primary) hypertension: Secondary | ICD-10-CM

## 2021-09-11 DIAGNOSIS — E1122 Type 2 diabetes mellitus with diabetic chronic kidney disease: Secondary | ICD-10-CM | POA: Diagnosis not present

## 2021-09-11 DIAGNOSIS — E118 Type 2 diabetes mellitus with unspecified complications: Secondary | ICD-10-CM

## 2021-09-11 LAB — IBC PANEL
Iron: 123 ug/dL (ref 42–165)
Saturation Ratios: 40.5 % (ref 20.0–50.0)
TIBC: 303.8 ug/dL (ref 250.0–450.0)
Transferrin: 217 mg/dL (ref 212.0–360.0)

## 2021-09-11 LAB — RENAL FUNCTION PANEL
Albumin: 4.1 g/dL (ref 3.5–5.2)
BUN: 26 mg/dL — ABNORMAL HIGH (ref 6–23)
CO2: 26 mEq/L (ref 19–32)
Calcium: 10.2 mg/dL (ref 8.4–10.5)
Chloride: 104 mEq/L (ref 96–112)
Creatinine, Ser: 1.34 mg/dL (ref 0.40–1.50)
GFR: 60.44 mL/min (ref >60.00)
Glucose, Bld: 104 mg/dL — ABNORMAL HIGH (ref 70–99)
Phosphorus: 2.8 mg/dL (ref 2.3–4.6)
Potassium: 4.5 mEq/L (ref 3.5–5.1)
Sodium: 137 mEq/L (ref 135–145)

## 2021-09-11 LAB — POCT GLYCOSYLATED HEMOGLOBIN (HGB A1C): Hemoglobin A1C: 7 % — AB (ref 4.0–5.6)

## 2021-09-11 LAB — CBC WITH DIFFERENTIAL/PLATELET
Basophils Absolute: 0.1 10*3/uL (ref 0.0–0.1)
Basophils Relative: 0.9 % (ref 0.0–3.0)
Eosinophils Absolute: 0.3 10*3/uL (ref 0.0–0.7)
Eosinophils Relative: 4.7 % (ref 0.0–5.0)
HCT: 37.5 % — ABNORMAL LOW (ref 39.0–52.0)
Hemoglobin: 12.8 g/dL — ABNORMAL LOW (ref 13.0–17.0)
Lymphocytes Relative: 19.4 % (ref 12.0–46.0)
Lymphs Abs: 1.3 10*3/uL (ref 0.7–4.0)
MCHC: 34.1 g/dL (ref 30.0–36.0)
MCV: 95 fl (ref 78.0–100.0)
Monocytes Absolute: 0.6 10*3/uL (ref 0.1–1.0)
Monocytes Relative: 8.5 % (ref 3.0–12.0)
Neutro Abs: 4.3 10*3/uL (ref 1.4–7.7)
Neutrophils Relative %: 66.5 % (ref 43.0–77.0)
Platelets: 277 10*3/uL (ref 150.0–400.0)
RBC: 3.95 Mil/uL — ABNORMAL LOW (ref 4.22–5.81)
RDW: 13.2 % (ref 11.5–15.5)
WBC: 6.5 10*3/uL (ref 4.0–10.5)

## 2021-09-11 LAB — FERRITIN: Ferritin: 189.3 ng/mL (ref 22.0–322.0)

## 2021-09-11 LAB — FOLATE: Folate: >24.2 ng/mL (ref >5.9)

## 2021-09-11 LAB — VITAMIN B12: Vitamin B-12: 620 pg/mL (ref 211–911)

## 2021-09-11 NOTE — Assessment & Plan Note (Addendum)
Chronic, improved control based on A1c 7%. Continue metformin 500mg  BID. Foot exam today. Reviewed importance of sugar control on kidney function.

## 2021-09-11 NOTE — Assessment & Plan Note (Addendum)
?  anemia of CKD. Colonoscopy pending - # provided to call and schedule. Update CBC today along with anemia panel (iron, B12, folate).

## 2021-09-11 NOTE — Patient Instructions (Addendum)
Labs today  Call [phone] to schedule kidney ultrasound.  You may call [facility] to schedule an appointment for colonoscopy at [phone].   Buy new blood pressure cuff and keep track at home, goal ideally <130/80.  Continue current medicines.

## 2021-09-11 NOTE — Progress Notes (Signed)
Patient ID: Joseph Sos., male    DOB: Aug 25, 1965, 56 y.o.   MRN: [id number]  This visit was conducted in person.  BP (!) 142/80 (BP Location: Right Arm, Cuff Size: Large)   Pulse (!) 57   Temp (!) 97.5 F (36.4 C) (Temporal)   Ht 6' 0.25" (1.835 m)   Wt 248 lb 4 oz (112.6 kg)   SpO2 98%   BMI 33.44 kg/m    CC: 6 mo f/u visit  Subjective:   HPI: Joseph Galloway. is a 56 y.o. male presenting on 09/11/2021 for Follow-up (Here for 6 mo f/u.)   New CKD - latest GFR 50%. Renal ultrasound still pending. Has increased water intake.   HTN - Compliant with current antihypertensive regimen of amlodipine 71m daily, lisinopril 272mbid. Does not check blood pressures at home. No low blood pressure readings or symptoms of dizziness/syncope. Denies HA, vision changes, CP/tightness, SOB, leg swelling.   DM - does not regularly check sugars. Compliant with antihyperglycemic regimen which includes: metformin 50046mid. Denies low sugars or hypoglycemic symptoms. Denies paresthesias, blurry vision. Last diabetic eye exam 11/2020. Glucometer brand: one touch ultra mini. Last foot exam: DUE. DSME: completed @ [facility].  Lab Results  Component Value Date   HGBA1C 7.0 (A) 09/11/2021   Diabetic Foot Exam - Simple   Simple Foot Form Diabetic Foot exam was performed with the following findings: Yes 09/11/2021  9:30 AM  Visual Inspection No deformities, no ulcerations, no other skin breakdown bilaterally: Yes Sensation Testing Intact to touch and monofilament testing bilaterally: Yes Pulse Check Posterior Tibialis and Dorsalis pulse intact bilaterally: Yes Comments Thickened elongated nails     Lab Results  Component Value Date   MICROALBUR <0.7 06/11/2021       Relevant past medical, surgical, family and social history reviewed and updated as indicated. Interim medical history since our last visit reviewed. Allergies and medications reviewed and updated. Outpatient Medications Prior to  Visit  Medication Sig Dispense Refill   amLODipine (NORVASC) 10 MG tablet Take 1 tablet (10 mg total) by mouth daily. 90 tablet 3   Ascorbic Acid (VITAMIN C) 1000 MG tablet Take 1,000 mg by mouth daily.     atorvastatin (LIPITOR) 40 MG tablet Take 1 tablet (40 mg total) by mouth daily. 90 tablet 3   azelastine (ASTELIN) 0.1 % nasal spray Place 2 sprays into both nostrils 2 (two) times daily. Use in each nostril as directed 30 mL 3   blood glucose meter kit and supplies KIT Dispense based on patient and insurance preference. Use to check sugars once daily. E11.8 1 each 3   Blood Glucose Monitoring Suppl (ONE TOUCH ULTRA MINI) w/Device KIT 1 kit by Does not apply route as needed. 1 kit 0   Blood Pressure Monitoring (BLOOD PRESSURE MONITOR/L CUFF) MISC Use to monitor blood pressure as directed. 1 each 0   cetirizine (ZYRTEC) 10 MG tablet Take 10 mg by mouth daily.     Cholecalciferol (VITAMIN D3) 2000 units TABS Take 1 tablet by mouth daily.     cyclobenzaprine (FLEXERIL) 10 MG tablet Take 0.5-1 tablets (5-10 mg total) by mouth 2 (two) times daily as needed for muscle spasms (sedation precautions). 30 tablet 0   fluticasone (FLONASE) 50 MCG/ACT nasal spray Place 2 sprays into both nostrils daily. 16 g 3   glucose blood test strip Use as instructed to check sugars daily and as needed 100 each 3   lisinopril (ZESTRIL)  20 MG tablet Take 1 tablet (20 mg total) by mouth 2 (two) times daily. 180 tablet 3   loratadine (CLARITIN) 10 MG tablet Take 10 mg by mouth daily. Takes during winter     metFORMIN (GLUCOPHAGE) 500 MG tablet Take 1 tablet (500 mg total) by mouth 2 (two) times daily with a meal. 180 tablet 3   MISC NATURAL PRODUCTS PO Take 500 mg by mouth daily. Turmeric + ginger     Multiple Vitamins-Minerals (CENTRUM MULTIGUMMIES PO) Take by mouth. 32+     OneTouch Delica Lancets 99M MISC 1 each by Does not apply route daily. 250.00 100 each 3   vitamin B-12 (CYANOCOBALAMIN) 1000 MCG tablet Take 1,000  mcg by mouth daily.     Ascorbic Acid (VITAMIN C) 500 MG CHEW Chew by mouth daily.     No facility-administered medications prior to visit.     Per HPI unless specifically indicated in ROS section below Review of Systems  Objective:  BP (!) 142/80 (BP Location: Right Arm, Cuff Size: Large)   Pulse (!) 57   Temp (!) 97.5 F (36.4 C) (Temporal)   Ht 6' 0.25" (1.835 m)   Wt 248 lb 4 oz (112.6 kg)   SpO2 98%   BMI 33.44 kg/m   Wt Readings from Last 3 Encounters:  09/11/21 248 lb 4 oz (112.6 kg)  03/07/21 246 lb 2 oz (111.6 kg)  11/08/20 245 lb 1 oz (111.2 kg)      Physical Exam Vitals and nursing note reviewed.  Constitutional:      Appearance: Normal appearance. He is not ill-appearing.  Eyes:     Extraocular Movements: Extraocular movements intact.     Conjunctiva/sclera: Conjunctivae normal.     Pupils: Pupils are equal, round, and reactive to light.  Cardiovascular:     Rate and Rhythm: Normal rate and regular rhythm.     Pulses: Normal pulses.     Heart sounds: Normal heart sounds. No murmur heard. Pulmonary:     Effort: Pulmonary effort is normal. No respiratory distress.     Breath sounds: Normal breath sounds. No wheezing, rhonchi or rales.  Musculoskeletal:     Right lower leg: No edema.     Left lower leg: No edema.     Comments: See HPI for foot exam if done  Skin:    General: Skin is warm and dry.     Findings: No rash.  Neurological:     Mental Status: He is alert.  Psychiatric:        Mood and Affect: Mood normal.        Behavior: Behavior normal.      Results for orders placed or performed in visit on 09/11/21  POCT glycosylated hemoglobin (Hb A1C)  Result Value Ref Range   Hemoglobin A1C 7.0 (A) 4.0 - 5.6 %   HbA1c POC (<> result, manual entry)     HbA1c, POC (prediabetic range)     HbA1c, POC (controlled diabetic range)      Assessment & Plan:   Problem List Items Addressed This Visit     Controlled diabetes mellitus type 2 with  complications ([place]) - Primary    Chronic, improved control based on A1c 7%. Continue metformin 567m BID. Foot exam today. Reviewed importance of sugar control on kidney function.        Relevant Orders   POCT glycosylated hemoglobin (Hb A1C) (Completed)   Hypertension    Chronic, stable on amlodipine and lisinopril.  Discussed  tighter BP goals in CKD.        CKD stage 3 due to type 2 diabetes mellitus ([place])    New diagnosis. Update renal panel and CBC today. Reviewed importance of tight BP and sugar control to maintain good kidney function. Renal US still pending, # provided to call and schedule.         Relevant Orders   Renal function panel   CBC with Differential/Platelet   Ferritin   IBC panel   Anemia    ?anemia of CKD. Colonoscopy pending - # provided to call and schedule. Update CBC today along with anemia panel (iron, B12, folate).        Relevant Orders   Folate   Vitamin B12     No orders of the defined types were placed in this encounter.  Orders Placed This Encounter  Procedures   Renal function panel   CBC with Differential/Platelet   Ferritin   IBC panel   Folate   Vitamin B12   POCT glycosylated hemoglobin (Hb A1C)    Patient Instructions  Labs today  Call [phone] to schedule kidney ultrasound.  You may call [facility] to schedule an appointment for colonoscopy at [phone].   Buy new blood pressure cuff and keep track at home, goal ideally <130/80.  Continue current medicines.   Follow up plan: Return in about 6 months (around 03/13/2022) for annual exam, prior fasting for blood work.  [name], MD

## 2021-09-11 NOTE — Assessment & Plan Note (Addendum)
Chronic, stable on amlodipine and lisinopril.  Discussed tighter BP goals in CKD.

## 2021-09-11 NOTE — Assessment & Plan Note (Signed)
New diagnosis. Update renal panel and CBC today. Reviewed importance of tight BP and sugar control to maintain good kidney function. Renal US still pending, # provided to call and schedule.

## 2022-01-01 LAB — HM DIABETES EYE EXAM

## 2022-02-21 ENCOUNTER — Encounter: Payer: Self-pay | Admitting: Family Medicine

## 2022-02-21 ENCOUNTER — Ambulatory Visit (INDEPENDENT_AMBULATORY_CARE_PROVIDER_SITE_OTHER): Payer: No Typology Code available for payment source | Admitting: Family Medicine

## 2022-02-21 VITALS — BP 110/70 | HR 71 | Temp 99.8°F | Ht 72.25 in | Wt 242.4 lb

## 2022-02-21 DIAGNOSIS — M79671 Pain in right foot: Secondary | ICD-10-CM | POA: Diagnosis not present

## 2022-02-21 MED ORDER — DICLOFENAC SODIUM 75 MG PO TBEC: 75.0000 mg | DELAYED_RELEASE_TABLET | Freq: Two times a day (BID) | ORAL | 3 refills | Status: DC

## 2022-02-21 NOTE — Progress Notes (Signed)
Joseph Harriss T. Kendal Ghazarian, MD, [place] at [facility] [place], [address]  Phone: [phone]  FAX: [phone]  Joseph Galloway. - 56 y.o. male  MRN [id number]  Date of Birth: 1965/11/29  Date: 02/21/2022  PCP: [name], MD  Referral: [name], MD  Chief Complaint  Patient presents with   Foot Pain    Right   Subjective:   Joseph Galloway. is a 56 y.o. very pleasant male patient with Body mass index is 32.64 kg/m. who presents with the following:  Patient presents with right-sided foot pain.  2 weeks ago feet started to flare up, and hurt pretty bad.  Put a walking boot on it yesterday.    He has had flatfeet for many years, and these of intermittently bothered him over the years.  He does work in [employer], and when he was doing some aeration he was repetitively using his arch area on a hard metal bar on the machine.  Subsequently, he developed a lot of pain in the area.  He does not have any focal bone pain.  He has tried some over-the-counter NSAIDs without much significant relief.  He did place his foot in a walking boot yesterday, does feel somewhat better today  Review of Systems is noted in the HPI, as appropriate  Objective:   BP 110/70   Pulse 71   Temp 99.8 F (37.7 C) (Oral)   Ht 6' 0.25" (1.835 m)   Wt 242 lb 6 oz (109.9 kg)   SpO2 96%   BMI 32.64 kg/m   GEN: No acute distress; alert,appropriate. PULM: Breathing comfortably in no respiratory distress PSYCH: Normally interactive.   Nontender throughout bony anatomy in bilateral feet.  Nontender at the Achilles, plantar fascia.  Does have some tenderness on the right foot along the longitudinal arch.  There is a question of some slight swelling compared to the contralateral side.  There is no bruising.  Nontender in the forefoot and midfoot.  Laboratory and Imaging Data:  Assessment and Plan:     ICD-10-CM   1. Acute  foot pain, right  M79.671      Longitudinal arch irritation.  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  I made a Poron cushion for the midfoot through hindfoot on the affected foot, and I think this will likely unload and additionally pad the area for the patient.  We will start him on some high-dose anti-inflammatories.  Social: Right now this is limiting his ability to maximally exercise  Medication Management during today's office visit: Meds ordered this encounter  Medications   diclofenac (VOLTAREN) 75 MG EC tablet    Sig: Take 1 tablet (75 mg total) by mouth 2 (two) times daily.    Dispense:  60 tablet    Refill:  3   There are no discontinued medications.  Orders placed today for conditions managed today: No orders of the defined types were placed in this encounter.   Disposition: No follow-ups on file.  [employer] One speech-to-text software was used for transcription in this dictation.  Possible transcriptional errors can occur using Editor, commissioning.   Signed,  Maud Deed. Laikynn Pollio, MD   Outpatient Encounter Medications as of 02/21/2022  Medication Sig   amLODipine (NORVASC) 10 MG tablet Take 1 tablet (10 mg total) by mouth daily.   Ascorbic Acid (VITAMIN C) 1000 MG tablet Take 1,000 mg by mouth daily.   atorvastatin (  LIPITOR) 40 MG tablet Take 1 tablet (40 mg total) by mouth daily.   azelastine (ASTELIN) 0.1 % nasal spray Place 2 sprays into both nostrils 2 (two) times daily. Use in each nostril as directed   blood glucose meter kit and supplies KIT Dispense based on patient and insurance preference. Use to check sugars once daily. E11.8   Blood Glucose Monitoring Suppl (ONE TOUCH ULTRA MINI) w/Device KIT 1 kit by Does not apply route as needed.   Blood Pressure Monitoring (BLOOD PRESSURE MONITOR/L CUFF) MISC Use to monitor blood pressure as directed.   cetirizine (ZYRTEC) 10 MG tablet Take 10 mg by mouth daily.   Cholecalciferol (VITAMIN D3) 2000 units  TABS Take 1 tablet by mouth daily.   cyclobenzaprine (FLEXERIL) 10 MG tablet Take 0.5-1 tablets (5-10 mg total) by mouth 2 (two) times daily as needed for muscle spasms (sedation precautions).   diclofenac (VOLTAREN) 75 MG EC tablet Take 1 tablet (75 mg total) by mouth 2 (two) times daily.   fluticasone (FLONASE) 50 MCG/ACT nasal spray Place 2 sprays into both nostrils daily.   glucose blood test strip Use as instructed to check sugars daily and as needed   lisinopril (ZESTRIL) 20 MG tablet Take 1 tablet (20 mg total) by mouth 2 (two) times daily.   loratadine (CLARITIN) 10 MG tablet Take 10 mg by mouth daily. Takes during winter   metFORMIN (GLUCOPHAGE) 500 MG tablet Take 1 tablet (500 mg total) by mouth 2 (two) times daily with a meal.   MISC NATURAL PRODUCTS PO Take 500 mg by mouth daily. Turmeric + ginger   Multiple Vitamins-Minerals (CENTRUM MULTIGUMMIES PO) Take by mouth. 09+   OneTouch Delica Lancets 64R MISC 1 each by Does not apply route daily. 250.00   vitamin B-12 (CYANOCOBALAMIN) 1000 MCG tablet Take 1,000 mcg by mouth daily.   No facility-administered encounter medications on file as of 02/21/2022.

## 2022-02-25 ENCOUNTER — Ambulatory Visit: Payer: No Typology Code available for payment source | Admitting: Family Medicine

## 2022-03-04 ENCOUNTER — Other Ambulatory Visit: Payer: Self-pay | Admitting: Family Medicine

## 2022-03-04 DIAGNOSIS — E213 Hyperparathyroidism, unspecified: Secondary | ICD-10-CM

## 2022-03-04 DIAGNOSIS — E1169 Type 2 diabetes mellitus with other specified complication: Secondary | ICD-10-CM

## 2022-03-04 DIAGNOSIS — Z125 Encounter for screening for malignant neoplasm of prostate: Secondary | ICD-10-CM

## 2022-03-04 DIAGNOSIS — E559 Vitamin D deficiency, unspecified: Secondary | ICD-10-CM

## 2022-03-04 DIAGNOSIS — E118 Type 2 diabetes mellitus with unspecified complications: Secondary | ICD-10-CM

## 2022-03-04 DIAGNOSIS — E1122 Type 2 diabetes mellitus with diabetic chronic kidney disease: Secondary | ICD-10-CM

## 2022-03-06 ENCOUNTER — Other Ambulatory Visit (INDEPENDENT_AMBULATORY_CARE_PROVIDER_SITE_OTHER): Payer: No Typology Code available for payment source

## 2022-03-06 DIAGNOSIS — E785 Hyperlipidemia, unspecified: Secondary | ICD-10-CM

## 2022-03-06 DIAGNOSIS — N183 Chronic kidney disease, stage 3 unspecified: Secondary | ICD-10-CM

## 2022-03-06 DIAGNOSIS — Z125 Encounter for screening for malignant neoplasm of prostate: Secondary | ICD-10-CM | POA: Diagnosis not present

## 2022-03-06 DIAGNOSIS — E1169 Type 2 diabetes mellitus with other specified complication: Secondary | ICD-10-CM

## 2022-03-06 DIAGNOSIS — E118 Type 2 diabetes mellitus with unspecified complications: Secondary | ICD-10-CM | POA: Diagnosis not present

## 2022-03-06 DIAGNOSIS — E1122 Type 2 diabetes mellitus with diabetic chronic kidney disease: Secondary | ICD-10-CM | POA: Diagnosis not present

## 2022-03-06 DIAGNOSIS — E559 Vitamin D deficiency, unspecified: Secondary | ICD-10-CM

## 2022-03-06 DIAGNOSIS — E213 Hyperparathyroidism, unspecified: Secondary | ICD-10-CM

## 2022-03-06 LAB — PSA: PSA: 2.36 ng/mL (ref 0.10–4.00)

## 2022-03-06 LAB — LIPID PANEL
Cholesterol: 128 mg/dL (ref 0–200)
HDL: 39.3 mg/dL (ref >39.00)
LDL Cholesterol: 74 mg/dL (ref 0–99)
NonHDL: 88.79
Total CHOL/HDL Ratio: 3
Triglycerides: 75 mg/dL (ref 0.0–149.0)
VLDL: 15 mg/dL (ref 0.0–40.0)

## 2022-03-06 LAB — COMPREHENSIVE METABOLIC PANEL
ALT: 41 U/L (ref 0–53)
AST: 24 U/L (ref 0–37)
Albumin: 4.3 g/dL (ref 3.5–5.2)
Alkaline Phosphatase: 54 U/L (ref 39–117)
BUN: 39 mg/dL — ABNORMAL HIGH (ref 6–23)
CO2: 27 mEq/L (ref 19–32)
Calcium: 10.2 mg/dL (ref 8.4–10.5)
Chloride: 103 mEq/L (ref 96–112)
Creatinine, Ser: 1.54 mg/dL — ABNORMAL HIGH (ref 0.40–1.50)
GFR: 50.98 mL/min — ABNORMAL LOW (ref >60.00)
Glucose, Bld: 103 mg/dL — ABNORMAL HIGH (ref 70–99)
Potassium: 4.4 mEq/L (ref 3.5–5.1)
Sodium: 137 mEq/L (ref 135–145)
Total Bilirubin: 0.5 mg/dL (ref 0.2–1.2)
Total Protein: 7.5 g/dL (ref 6.0–8.3)

## 2022-03-06 LAB — CBC WITH DIFFERENTIAL/PLATELET
Basophils Absolute: 0.1 10*3/uL (ref 0.0–0.1)
Basophils Relative: 0.8 % (ref 0.0–3.0)
Eosinophils Absolute: 0.4 10*3/uL (ref 0.0–0.7)
Eosinophils Relative: 5.5 % — ABNORMAL HIGH (ref 0.0–5.0)
HCT: 38.4 % — ABNORMAL LOW (ref 39.0–52.0)
Hemoglobin: 12.8 g/dL — ABNORMAL LOW (ref 13.0–17.0)
Lymphocytes Relative: 20.1 % (ref 12.0–46.0)
Lymphs Abs: 1.6 10*3/uL (ref 0.7–4.0)
MCHC: 33.4 g/dL (ref 30.0–36.0)
MCV: 95 fl (ref 78.0–100.0)
Monocytes Absolute: 0.7 10*3/uL (ref 0.1–1.0)
Monocytes Relative: 8.5 % (ref 3.0–12.0)
Neutro Abs: 5.3 10*3/uL (ref 1.4–7.7)
Neutrophils Relative %: 65.1 % (ref 43.0–77.0)
Platelets: 342 10*3/uL (ref 150.0–400.0)
RBC: 4.04 Mil/uL — ABNORMAL LOW (ref 4.22–5.81)
RDW: 13.4 % (ref 11.5–15.5)
WBC: 8.1 10*3/uL (ref 4.0–10.5)

## 2022-03-06 LAB — MICROALBUMIN / CREATININE URINE RATIO
Creatinine,U: 100.1 mg/dL
Microalb Creat Ratio: 0.7 mg/g (ref 0.0–30.0)
Microalb, Ur: <0.7 mg/dL (ref 0.0–1.9)

## 2022-03-06 LAB — HEMOGLOBIN A1C: Hgb A1c MFr Bld: 7.2 % — ABNORMAL HIGH (ref 4.6–6.5)

## 2022-03-06 LAB — VITAMIN D 25 HYDROXY (VIT D DEFICIENCY, FRACTURES): VITD: 67.32 ng/mL (ref 30.00–100.00)

## 2022-03-07 LAB — PARATHYROID HORMONE, INTACT (NO CA): PTH: 91 pg/mL — ABNORMAL HIGH (ref 16–77)

## 2022-03-13 ENCOUNTER — Encounter: Payer: Self-pay | Admitting: Family Medicine

## 2022-03-13 ENCOUNTER — Ambulatory Visit (INDEPENDENT_AMBULATORY_CARE_PROVIDER_SITE_OTHER): Payer: No Typology Code available for payment source | Admitting: Family Medicine

## 2022-03-13 VITALS — BP 136/86 | HR 60 | Temp 97.6°F | Ht 73.25 in | Wt 241.2 lb

## 2022-03-13 DIAGNOSIS — M2141 Flat foot [pes planus] (acquired), right foot: Secondary | ICD-10-CM

## 2022-03-13 DIAGNOSIS — N2581 Secondary hyperparathyroidism of renal origin: Secondary | ICD-10-CM

## 2022-03-13 DIAGNOSIS — Z23 Encounter for immunization: Secondary | ICD-10-CM | POA: Diagnosis not present

## 2022-03-13 DIAGNOSIS — Z0000 Encounter for general adult medical examination without abnormal findings: Secondary | ICD-10-CM

## 2022-03-13 DIAGNOSIS — D649 Anemia, unspecified: Secondary | ICD-10-CM

## 2022-03-13 DIAGNOSIS — I10 Essential (primary) hypertension: Secondary | ICD-10-CM

## 2022-03-13 DIAGNOSIS — M2142 Flat foot [pes planus] (acquired), left foot: Secondary | ICD-10-CM

## 2022-03-13 DIAGNOSIS — E669 Obesity, unspecified: Secondary | ICD-10-CM

## 2022-03-13 DIAGNOSIS — Z1211 Encounter for screening for malignant neoplasm of colon: Secondary | ICD-10-CM

## 2022-03-13 DIAGNOSIS — J309 Allergic rhinitis, unspecified: Secondary | ICD-10-CM | POA: Diagnosis not present

## 2022-03-13 DIAGNOSIS — E1169 Type 2 diabetes mellitus with other specified complication: Secondary | ICD-10-CM

## 2022-03-13 DIAGNOSIS — E785 Hyperlipidemia, unspecified: Secondary | ICD-10-CM

## 2022-03-13 DIAGNOSIS — E559 Vitamin D deficiency, unspecified: Secondary | ICD-10-CM

## 2022-03-13 DIAGNOSIS — E1122 Type 2 diabetes mellitus with diabetic chronic kidney disease: Secondary | ICD-10-CM

## 2022-03-13 DIAGNOSIS — N183 Chronic kidney disease, stage 3 unspecified: Secondary | ICD-10-CM

## 2022-03-13 MED ORDER — AZELASTINE HCL 0.1 % NA SOLN: 2.0000 | Freq: Two times a day (BID) | NASAL | 3 refills | Status: DC

## 2022-03-13 MED ORDER — LISINOPRIL 20 MG PO TABS: 20.0000 mg | ORAL_TABLET | Freq: Two times a day (BID) | ORAL | 4 refills | Status: DC

## 2022-03-13 MED ORDER — ATORVASTATIN CALCIUM 40 MG PO TABS: 40.0000 mg | ORAL_TABLET | Freq: Every day | ORAL | 4 refills | Status: DC

## 2022-03-13 MED ORDER — AMLODIPINE BESYLATE 10 MG PO TABS: 10.0000 mg | ORAL_TABLET | Freq: Every day | ORAL | 4 refills | Status: DC

## 2022-03-13 MED ORDER — METFORMIN HCL 500 MG PO TABS: 500.0000 mg | ORAL_TABLET | Freq: Two times a day (BID) | ORAL | 4 refills | Status: DC

## 2022-03-13 NOTE — Assessment & Plan Note (Signed)
Continue antihistamine, nasal steroid, astelin nasal spray PRN.  Anticipate current cough due to allergies.  No h/o asthma.

## 2022-03-13 NOTE — Assessment & Plan Note (Signed)
Chronic, stable on atorvastatin. The ASCVD Risk score (Arnett DK, et al., 2019) failed to calculate for the following reasons:   The valid total cholesterol range is 130 to 320 mg/dL  

## 2022-03-13 NOTE — Assessment & Plan Note (Signed)
Chronic, mildly elevated based on A1c. Continue metformin 500mg  BID.  Discussed possible addition of GLP1RA or SGLT2i in the future.

## 2022-03-13 NOTE — Assessment & Plan Note (Signed)
Chronic, remains overall stable with GFR 50s. Discussed caution with NSAID use - he uses sparingly.

## 2022-03-13 NOTE — Assessment & Plan Note (Signed)
Encourage healthy diet and lifestyle choices for sustainable weight loss.  

## 2022-03-13 NOTE — Patient Instructions (Addendum)
Flu shot today, Tdap today.  We will request latest diabetic eye exam from Dr Renaldo Fiddler in [facility].  Good to see you today Return in 6 months for diabetes follow up visit.   Health Maintenance, Male Adopting a healthy lifestyle and getting preventive care are important in promoting health and wellness. Ask your health care provider about: The right schedule for you to have regular tests and exams. Things you can do on your own to prevent diseases and keep yourself healthy. What should I know about diet, weight, and exercise? Eat a healthy diet  Eat a diet that includes plenty of vegetables, fruits, low-fat dairy products, and lean protein. Do not eat a lot of foods that are high in solid fats, added sugars, or sodium. Maintain a healthy weight Body mass index (BMI) is a measurement that can be used to identify possible weight problems. It estimates body fat based on height and weight. Your health care provider can help determine your BMI and help you achieve or maintain a healthy weight. Get regular exercise Get regular exercise. This is one of the most important things you can do for your health. Most adults should: Exercise for at least 150 minutes each week. The exercise should increase your heart rate and make you sweat (moderate-intensity exercise). Do strengthening exercises at least twice a week. This is in addition to the moderate-intensity exercise. Spend less time sitting. Even light physical activity can be beneficial. Watch cholesterol and blood lipids Have your blood tested for lipids and cholesterol at 54 years of age, then have this test every 5 years. You may need to have your cholesterol levels checked more often if: Your lipid or cholesterol levels are high. You are older than 54 years of age. You are at high risk for heart disease. What should I know about cancer screening? Many types of cancers can be detected early and may often be prevented. Depending on your health  history and family history, you may need to have cancer screening at various ages. This may include screening for: Colorectal cancer. Prostate cancer. Skin cancer. Lung cancer. What should I know about heart disease, diabetes, and high blood pressure? Blood pressure and heart disease High blood pressure causes heart disease and increases the risk of stroke. This is more likely to develop in people who have high blood pressure readings or are overweight. Talk with your health care provider about your target blood pressure readings. Have your blood pressure checked: Every 3-5 years if you are 67-39 years of age. Every year if you are 29 years old or older. If you are between the ages of 23 and 89 and are a current or former smoker, ask your health care provider if you should have a one-time screening for abdominal aortic aneurysm (AAA). Diabetes Have regular diabetes screenings. This checks your fasting blood sugar level. Have the screening done: Once every three years after age 15 if you are at a normal weight and have a low risk for diabetes. More often and at a younger age if you are overweight or have a high risk for diabetes. What should I know about preventing infection? Hepatitis B If you have a higher risk for hepatitis B, you should be screened for this virus. Talk with your health care provider to find out if you are at risk for hepatitis B infection. Hepatitis C Blood testing is recommended for: Everyone born from [birth year] through 1963. Anyone with known risk factors for hepatitis C. Sexually transmitted infections (  STIs) You should be screened each year for STIs, including gonorrhea and chlamydia, if: You are sexually active and are younger than 54 years of age. You are older than 54 years of age and your health care provider tells you that you are at risk for this type of infection. Your sexual activity has changed since you were last screened, and you are at increased risk for  chlamydia or gonorrhea. Ask your health care provider if you are at risk. Ask your health care provider about whether you are at high risk for HIV. Your health care provider may recommend a prescription medicine to help prevent HIV infection. If you choose to take medicine to prevent HIV, you should first get tested for HIV. You should then be tested every 3 months for as long as you are taking the medicine. Follow these instructions at home: Alcohol use Do not drink alcohol if your health care provider tells you not to drink. If you drink alcohol: Limit how much you have to 0-2 drinks a day. Know how much alcohol is in your drink. In the [place] one drink equals one 12 oz bottle of beer (355 mL), one 5 oz glass of wine (148 mL), or one 1 oz glass of hard liquor (44 mL). Lifestyle Do not use any products that contain nicotine or tobacco. These products include cigarettes, chewing tobacco, and vaping devices, such as e-cigarettes. If you need help quitting, ask your health care provider. Do not use street drugs. Do not share needles. Ask your health care provider for help if you need support or information about quitting drugs. General instructions Schedule regular health, dental, and eye exams. Stay current with your vaccines. Tell your health care provider if: You often feel depressed. You have ever been abused or do not feel safe at home. Summary Adopting a healthy lifestyle and getting preventive care are important in promoting health and wellness. Follow your health care provider's instructions about healthy diet, exercising, and getting tested or screened for diseases. Follow your health care provider's instructions on monitoring your cholesterol and blood pressure. This information is not intended to replace advice given to you by your health care provider. Make sure you discuss any questions you have with your health care provider. Document Revised: 08/14/2020 Document Reviewed:  08/14/2020 Elsevier Patient Education  [place].

## 2022-03-13 NOTE — Assessment & Plan Note (Signed)
Preventative protocols reviewed and updated unless pt declined. Discussed healthy diet and lifestyle.  

## 2022-03-13 NOTE — Assessment & Plan Note (Signed)
Continue vit D replacement.  

## 2022-03-13 NOTE — Assessment & Plan Note (Signed)
Appreciate DR [facility]

## 2022-03-13 NOTE — Progress Notes (Signed)
Patient ID: Joseph Sos., male    DOB: Aug 02, 1965, 56 y.o.   MRN: [id number]  This visit was conducted in person.  BP 136/86   Pulse 60   Temp 97.6 F (36.4 C) (Temporal)   Ht 6' 1.25" (1.861 m)   Wt 241 lb 3.2 oz (109.4 kg)   SpO2 99%   BMI 31.61 kg/m   BP Readings from Last 3 Encounters:  03/13/22 136/86  02/21/22 110/70  09/11/21 (!) 142/80   CC: CPE Subjective:   HPI: Joseph Vizzini. is a 56 y.o. male presenting on 03/13/2022 for Annual Exam   Cough present for a few weeks - wonders if allergies. No fever.   Preventative: Colon cancer screening - has not completed colonoscopy. Discussed, new referral placed.  Fmhx prostate cancer in father - No nocturia, no changes in stream. PSA yearly Lung cancer screening - not eligible  Flu shot yearly [place] x2 07/2019  Tdap 2013, update today Pneumovax 2006.  Shingrix - discussed. Will defer for now.  Seat belt use discussed.  Sunscreen use discussed. No changing moles on skin.  Sleep - averaging 6-7 hours/night Non smoker  Alcohol - rare Dentist/periodontist 4 times a year (gum disease) Eye exam yearly   Caffeine: rare. 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  Diet: lots of water throughout the day, some fruits/vegetables, watches carbs and starches      Relevant past medical, surgical, family and social history reviewed and updated as indicated. Interim medical history since our last visit reviewed. Allergies and medications reviewed and updated. Outpatient Medications Prior to Visit  Medication Sig Dispense Refill   Ascorbic Acid (VITAMIN C) 1000 MG tablet Take 1,000 mg by mouth daily.     blood glucose meter kit and supplies KIT Dispense based on patient and insurance preference. Use to check sugars once daily. E11.8 1 each 3   Blood Glucose Monitoring Suppl  (ONE TOUCH ULTRA MINI) w/Device KIT 1 kit by Does not apply route as needed. 1 kit 0   Blood Pressure Monitoring (BLOOD PRESSURE MONITOR/L CUFF) MISC Use to monitor blood pressure as directed. 1 each 0   cetirizine (ZYRTEC) 10 MG tablet Take 10 mg by mouth daily.     Cholecalciferol (VITAMIN D3) 2000 units TABS Take 1 tablet by mouth daily.     cyclobenzaprine (FLEXERIL) 10 MG tablet Take 0.5-1 tablets (5-10 mg total) by mouth 2 (two) times daily as needed for muscle spasms (sedation precautions). 30 tablet 0   diclofenac (VOLTAREN) 75 MG EC tablet Take 1 tablet (75 mg total) by mouth 2 (two) times daily. 60 tablet 3   fluticasone (FLONASE) 50 MCG/ACT nasal spray Place 2 sprays into both nostrils daily. 16 g 3   glucose blood test strip Use as instructed to check sugars daily and as needed 100 each 3   loratadine (CLARITIN) 10 MG tablet Take 10 mg by mouth daily. Takes during winter     MISC NATURAL PRODUCTS PO Take 500 mg by mouth daily. Turmeric + ginger     Multiple Vitamins-Minerals (CENTRUM MULTIGUMMIES PO) Take by mouth. 28+     OneTouch Delica Lancets 78M MISC 1 each by Does not apply route daily. 250.00 100 each 3   vitamin B-12 (CYANOCOBALAMIN) 1000 MCG tablet Take 1,000 mcg by mouth daily.  amLODipine (NORVASC) 10 MG tablet Take 1 tablet (10 mg total) by mouth daily. 90 tablet 3   atorvastatin (LIPITOR) 40 MG tablet Take 1 tablet (40 mg total) by mouth daily. 90 tablet 3   azelastine (ASTELIN) 0.1 % nasal spray Place 2 sprays into both nostrils 2 (two) times daily. Use in each nostril as directed 30 mL 3   lisinopril (ZESTRIL) 20 MG tablet Take 1 tablet (20 mg total) by mouth 2 (two) times daily. 180 tablet 3   metFORMIN (GLUCOPHAGE) 500 MG tablet Take 1 tablet (500 mg total) by mouth 2 (two) times daily with a meal. 180 tablet 3   No facility-administered medications prior to visit.     Per HPI unless specifically indicated in ROS section below Review of Systems  Constitutional:   Negative for activity change, appetite change, chills, fatigue, fever and unexpected weight change.  HENT:  Positive for congestion, postnasal drip and rhinorrhea. Negative for hearing loss.   Eyes:  Negative for visual disturbance.  Respiratory:  Positive for cough and wheezing. Negative for chest tightness and shortness of breath.   Cardiovascular:  Negative for chest pain, palpitations and leg swelling.  Gastrointestinal:  Negative for abdominal distention, abdominal pain, blood in stool, constipation, diarrhea, nausea and vomiting.  Genitourinary:  Negative for difficulty urinating and hematuria.  Musculoskeletal:  Negative for arthralgias, myalgias and neck pain.  Skin:  Negative for rash.  Neurological:  Negative for dizziness, seizures, syncope and headaches.  Hematological:  Negative for adenopathy. Does not bruise/bleed easily.  Psychiatric/Behavioral:  Negative for dysphoric mood. The patient is not nervous/anxious.     Objective:  BP 136/86   Pulse 60   Temp 97.6 F (36.4 C) (Temporal)   Ht 6' 1.25" (1.861 m)   Wt 241 lb 3.2 oz (109.4 kg)   SpO2 99%   BMI 31.61 kg/m   Wt Readings from Last 3 Encounters:  03/13/22 241 lb 3.2 oz (109.4 kg)  02/21/22 242 lb 6 oz (109.9 kg)  09/11/21 248 lb 4 oz (112.6 kg)      Physical Exam Vitals and nursing note reviewed.  Constitutional:      General: He is not in acute distress.    Appearance: Normal appearance. He is well-developed. He is not ill-appearing.  HENT:     Head: Normocephalic and atraumatic.     Right Ear: Hearing, tympanic membrane, ear canal and external ear normal.     Left Ear: Hearing, tympanic membrane, ear canal and external ear normal.     Nose: Nose normal.     Mouth/Throat:     Mouth: Mucous membranes are moist.     Pharynx: Oropharynx is clear. No oropharyngeal exudate or posterior oropharyngeal erythema.  Eyes:     General: No scleral icterus.    Extraocular Movements: Extraocular movements intact.      Conjunctiva/sclera: Conjunctivae normal.     Pupils: Pupils are equal, round, and reactive to light.  Neck:     Thyroid: No thyroid mass or thyromegaly.  Cardiovascular:     Rate and Rhythm: Normal rate and regular rhythm.     Pulses: Normal pulses.          Radial pulses are 2+ on the right side and 2+ on the left side.     Heart sounds: Normal heart sounds. No murmur heard. Pulmonary:     Effort: Pulmonary effort is normal. No respiratory distress.     Breath sounds: Normal breath sounds. No wheezing, rhonchi or  rales.  Abdominal:     General: Bowel sounds are normal. There is no distension.     Palpations: Abdomen is soft. There is no mass.     Tenderness: There is no abdominal tenderness. There is no guarding or rebound.     Hernia: No hernia is present.  Musculoskeletal:        General: Normal range of motion.     Cervical back: Normal range of motion and neck supple.     Right lower leg: No edema.     Left lower leg: No edema.  Lymphadenopathy:     Cervical: No cervical adenopathy.  Skin:    General: Skin is warm and dry.     Findings: No rash.  Neurological:     General: No focal deficit present.     Mental Status: He is alert and oriented to person, place, and time.  Psychiatric:        Mood and Affect: Mood normal.        Behavior: Behavior normal.        Thought Content: Thought content normal.        Judgment: Judgment normal.       Results for orders placed or performed in visit on 03/06/22  Parathyroid hormone, intact (no Ca)  Result Value Ref Range   PTH 91 (H) 16 - 77 pg/mL  CBC with Differential/Platelet  Result Value Ref Range   WBC 8.1 4.0 - 10.5 K/uL   RBC 4.04 (L) 4.22 - 5.81 Mil/uL   Hemoglobin 12.8 (L) 13.0 - 17.0 g/dL   HCT 38.4 (L) 39.0 - 52.0 %   MCV 95.0 78.0 - 100.0 fl   MCHC 33.4 30.0 - 36.0 g/dL   RDW 13.4 11.5 - 15.5 %   Platelets 342.0 150.0 - 400.0 K/uL   Neutrophils Relative % 65.1 43.0 - 77.0 %   Lymphocytes Relative 20.1 12.0 -  46.0 %   Monocytes Relative 8.5 3.0 - 12.0 %   Eosinophils Relative 5.5 (H) 0.0 - 5.0 %   Basophils Relative 0.8 0.0 - 3.0 %   Neutro Abs 5.3 1.4 - 7.7 K/uL   Lymphs Abs 1.6 0.7 - 4.0 K/uL   Monocytes Absolute 0.7 0.1 - 1.0 K/uL   Eosinophils Absolute 0.4 0.0 - 0.7 K/uL   Basophils Absolute 0.1 0.0 - 0.1 K/uL  PSA  Result Value Ref Range   PSA 2.36 0.10 - 4.00 ng/mL  VITAMIN D 25 Hydroxy (Vit-D Deficiency, Fractures)  Result Value Ref Range   VITD 67.32 30.00 - 100.00 ng/mL  Microalbumin / creatinine urine ratio  Result Value Ref Range   Microalb, Ur <0.7 0.0 - 1.9 mg/dL   Creatinine,U 100.1 mg/dL   Microalb Creat Ratio 0.7 0.0 - 30.0 mg/g  Hemoglobin A1c  Result Value Ref Range   Hgb A1c MFr Bld 7.2 (H) 4.6 - 6.5 %  Comprehensive metabolic panel  Result Value Ref Range   Sodium 137 135 - 145 mEq/L   Potassium 4.4 3.5 - 5.1 mEq/L   Chloride 103 96 - 112 mEq/L   CO2 27 19 - 32 mEq/L   Glucose, Bld 103 (H) 70 - 99 mg/dL   BUN 39 (H) 6 - 23 mg/dL   Creatinine, Ser 1.54 (H) 0.40 - 1.50 mg/dL   Total Bilirubin 0.5 0.2 - 1.2 mg/dL   Alkaline Phosphatase 54 39 - 117 U/L   AST 24 0 - 37 U/L   ALT 41 0 - 53 U/L  Total Protein 7.5 6.0 - 8.3 g/dL   Albumin 4.3 3.5 - 5.2 g/dL   GFR 50.98 (L) >60.00 mL/min   Calcium 10.2 8.4 - 10.5 mg/dL  Lipid panel  Result Value Ref Range   Cholesterol 128 0 - 200 mg/dL   Triglycerides 75.0 0.0 - 149.0 mg/dL   HDL 39.30 >39.00 mg/dL   VLDL 15.0 0.0 - 40.0 mg/dL   LDL Cholesterol 74 0 - 99 mg/dL   Total CHOL/HDL Ratio 3    NonHDL 88.79     Assessment & Plan:   Problem List Items Addressed This Visit       Unprioritized   Health maintenance examination - Primary (Chronic)    Preventative protocols reviewed and updated unless pt declined. Discussed healthy diet and lifestyle.       Type 2 diabetes mellitus with other specified complication (HCC)    Chronic, mildly elevated based on A1c. Continue metformin 554m BID.  Discussed  possible addition of GLP1RA or SGLT2i in the future.       Relevant Medications   lisinopril (ZESTRIL) 20 MG tablet   atorvastatin (LIPITOR) 40 MG tablet   metFORMIN (GLUCOPHAGE) 500 MG tablet   Hyperlipidemia associated with type 2 diabetes mellitus (HCC)    Chronic, stable on atorvastatin. The ASCVD Risk score (Arnett DK, et al., 2019) failed to calculate for the following reasons:   The valid total cholesterol range is 130 to 320 mg/dL       Relevant Medications   lisinopril (ZESTRIL) 20 MG tablet   amLODipine (NORVASC) 10 MG tablet   atorvastatin (LIPITOR) 40 MG tablet   metFORMIN (GLUCOPHAGE) 500 MG tablet   Obesity, Class I, BMI 30-34.9    Encourage healthy diet and lifestyle choices for sustainable weight loss.       Hypertension    Chronic, adequate. Continue amlodipine and lisinopril.       Relevant Medications   lisinopril (ZESTRIL) 20 MG tablet   amLODipine (NORVASC) 10 MG tablet   atorvastatin (LIPITOR) 40 MG tablet   Allergic rhinitis    Continue antihistamine, nasal steroid, astelin nasal spray PRN.  Anticipate current cough due to allergies.  No h/o asthma.       Vitamin D deficiency    Continue vit D replacement.       Secondary hyperparathyroidism of renal origin (HCC)    Chronic, stable. Anticipate secondary due to CKD      Pes planus    Appreciate DR [facility]      CKD stage 3 due to type 2 diabetes mellitus (HCC)    Chronic, remains overall stable with GFR 50s. Discussed caution with NSAID use - he uses sparingly.       Relevant Medications   lisinopril (ZESTRIL) 20 MG tablet   atorvastatin (LIPITOR) 40 MG tablet   metFORMIN (GLUCOPHAGE) 500 MG tablet   Anemia    Mild, anticipate CKD related. Continue to monitor.  Re-refer to GI for colonoscopy.       Other Visit Diagnoses     Special screening for malignant neoplasms, colon       Relevant Orders   Ambulatory referral to Gastroenterology        Meds ordered this encounter   Medications   lisinopril (ZESTRIL) 20 MG tablet    Sig: Take 1 tablet (20 mg total) by mouth 2 (two) times daily.    Dispense:  180 tablet    Refill:  4   amLODipine (NORVASC) 10 MG tablet  Sig: Take 1 tablet (10 mg total) by mouth daily.    Dispense:  90 tablet    Refill:  4   atorvastatin (LIPITOR) 40 MG tablet    Sig: Take 1 tablet (40 mg total) by mouth daily.    Dispense:  90 tablet    Refill:  4   azelastine (ASTELIN) 0.1 % nasal spray    Sig: Place 2 sprays into both nostrils 2 (two) times daily. Use in each nostril as directed    Dispense:  30 mL    Refill:  3   metFORMIN (GLUCOPHAGE) 500 MG tablet    Sig: Take 1 tablet (500 mg total) by mouth 2 (two) times daily with a meal.    Dispense:  180 tablet    Refill:  4   Orders Placed This Encounter  Procedures   Ambulatory referral to Gastroenterology    Referral Priority:   Routine    Referral Type:   Consultation    Referral Reason:   Specialty Services Required    Number of Visits Requested:   1     Patient instructions: Flu shot today, Tdap today.  We will request latest diabetic eye exam from Dr Julien Girt in [facility].  Good to see you today Return in 6 months for diabetes follow up visit.   Follow up plan: Return in about 6 months (around 09/12/2022) for follow up visit.  [name], MD

## 2022-03-13 NOTE — Assessment & Plan Note (Signed)
Chronic, adequate. Continue amlodipine and lisinopril.

## 2022-03-13 NOTE — Addendum Note (Signed)
Addended by: Nanci Pina on: 03/13/2022 09:36 AM   Modules accepted: Orders

## 2022-03-13 NOTE — Assessment & Plan Note (Signed)
Chronic, stable. Anticipate secondary due to CKD

## 2022-03-13 NOTE — Assessment & Plan Note (Addendum)
Mild, anticipate CKD related. Continue to monitor.  Re-refer to GI for colonoscopy.

## 2022-08-20 ENCOUNTER — Encounter: Payer: Self-pay | Admitting: Family Medicine

## 2022-08-20 ENCOUNTER — Encounter: Payer: Self-pay | Admitting: *Deleted

## 2022-08-20 ENCOUNTER — Ambulatory Visit (INDEPENDENT_AMBULATORY_CARE_PROVIDER_SITE_OTHER): Payer: No Typology Code available for payment source | Admitting: Family Medicine

## 2022-08-20 VITALS — BP 126/84 | HR 60 | Temp 97.2°F | Ht 73.25 in | Wt 244.5 lb

## 2022-08-20 DIAGNOSIS — K644 Residual hemorrhoidal skin tags: Secondary | ICD-10-CM | POA: Diagnosis not present

## 2022-08-20 DIAGNOSIS — E1169 Type 2 diabetes mellitus with other specified complication: Secondary | ICD-10-CM

## 2022-08-20 DIAGNOSIS — Z7984 Long term (current) use of oral hypoglycemic drugs: Secondary | ICD-10-CM

## 2022-08-20 MED ORDER — HYDROCORTISONE (PERIANAL) 2.5 % EX CREA: 1.0000 | TOPICAL_CREAM | Freq: Two times a day (BID) | CUTANEOUS | 0 refills | Status: AC

## 2022-08-20 MED ORDER — ONETOUCH DELICA LANCETS 33G MISC: 1.0000 | Freq: Every day | 4 refills | Status: AC

## 2022-08-20 MED ORDER — ONETOUCH ULTRA TEST VI STRP: ORAL_STRIP | 4 refills | Status: DC

## 2022-08-20 MED ORDER — TURMERIC 500 MG PO CAPS: 1.0000 | ORAL_CAPSULE | Freq: Every day | ORAL | Status: AC

## 2022-08-20 NOTE — Progress Notes (Signed)
Ph: [phone] Fax: [phone]   Patient ID: Alfredia Client., male    DOB: [birth year], 57 y.o.   MRN: [id number]  This visit was conducted in person.  BP 126/84   Pulse 60   Temp (!) 97.2 F (36.2 C) (Temporal)   Ht 6' 1.25" (1.861 m)   Wt 244 lb 8 oz (110.9 kg)   SpO2 99%   BMI 32.04 kg/m    CC: "I've got a hemorrhoid"  Subjective:   HPI: Chevez Rivest. is a 57 y.o. male presenting on 08/20/2022 for Hemorrhoids (C/o hemorrhoids. Noticed this wk. )   H/o hemorrhoids, but previously manageable with prep H.   1 wk h/o tender and swollen hemorrhoid, acutely painful.  Treating with preparation H cream without benefit.   Normal BM 2-3 times/day. No straining. No constipation.   DM - stable period on metformin Lab Results  Component Value Date   HGBA1C 7.2 (H) 03/06/2022        Relevant past medical, surgical, family and social history reviewed and updated as indicated. Interim medical history since our last visit reviewed. Allergies and medications reviewed and updated. Outpatient Medications Prior to Visit  Medication Sig Dispense Refill   amLODipine (NORVASC) 10 MG tablet Take 1 tablet (10 mg total) by mouth daily. 90 tablet 4   Ascorbic Acid (VITAMIN C) 1000 MG tablet Take 1,000 mg by mouth daily.     atorvastatin (LIPITOR) 40 MG tablet Take 1 tablet (40 mg total) by mouth daily. 90 tablet 4   azelastine (ASTELIN) 0.1 % nasal spray Place 2 sprays into both nostrils 2 (two) times daily. Use in each nostril as directed 30 mL 3   blood glucose meter kit and supplies KIT Dispense based on patient and insurance preference. Use to check sugars once daily. E11.8 1 each 3   Blood Glucose Monitoring Suppl (ONE TOUCH ULTRA MINI) w/Device KIT 1 kit by Does not apply route as needed. 1 kit 0   Blood Pressure Monitoring (BLOOD PRESSURE MONITOR/L CUFF) MISC Use to monitor blood pressure as directed. 1 each 0   cetirizine (ZYRTEC) 10 MG tablet Take 10 mg by mouth daily.      Cholecalciferol (VITAMIN D3) 2000 units TABS Take 1 tablet by mouth daily.     cyclobenzaprine (FLEXERIL) 10 MG tablet Take 0.5-1 tablets (5-10 mg total) by mouth 2 (two) times daily as needed for muscle spasms (sedation precautions). 30 tablet 0   diclofenac (VOLTAREN) 75 MG EC tablet Take 1 tablet (75 mg total) by mouth 2 (two) times daily. 60 tablet 3   fluticasone (FLONASE) 50 MCG/ACT nasal spray Place 2 sprays into both nostrils daily. 16 g 3   lisinopril (ZESTRIL) 20 MG tablet Take 1 tablet (20 mg total) by mouth 2 (two) times daily. 180 tablet 4   loratadine (CLARITIN) 10 MG tablet Take 10 mg by mouth daily. Takes during winter     metFORMIN (GLUCOPHAGE) 500 MG tablet Take 1 tablet (500 mg total) by mouth 2 (two) times daily with a meal. 180 tablet 4   MISC NATURAL PRODUCTS PO Take 500 mg by mouth daily. Turmeric + ginger     Multiple Vitamins-Minerals (CENTRUM MULTIGUMMIES PO) Take by mouth. 50+     glucose blood test strip Use as instructed to check sugars daily and as needed 100 each 3   OneTouch Delica Lancets 33G MISC 1 each by Does not apply route daily. 250.00 100 each 3   vitamin  B-12 (CYANOCOBALAMIN) 1000 MCG tablet Take 1,000 mcg by mouth daily.     No facility-administered medications prior to visit.     Per HPI unless specifically indicated in ROS section below Review of Systems  Objective:  BP 126/84   Pulse 60   Temp (!) 97.2 F (36.2 C) (Temporal)   Ht 6' 1.25" (1.861 m)   Wt 244 lb 8 oz (110.9 kg)   SpO2 99%   BMI 32.04 kg/m   Wt Readings from Last 3 Encounters:  08/20/22 244 lb 8 oz (110.9 kg)  03/13/22 241 lb 3.2 oz (109.4 kg)  02/21/22 242 lb 6 oz (109.9 kg)      Physical Exam Vitals and nursing note reviewed.  Constitutional:      Appearance: Normal appearance. He is not ill-appearing.  HENT:     Mouth/Throat:     Mouth: Mucous membranes are moist.     Pharynx: Oropharynx is clear. No oropharyngeal exudate or posterior oropharyngeal erythema.   Cardiovascular:     Rate and Rhythm: Normal rate and regular rhythm.     Pulses: Normal pulses.     Heart sounds: Normal heart sounds. No murmur heard. Pulmonary:     Effort: Pulmonary effort is normal. No respiratory distress.     Breath sounds: Normal breath sounds. No wheezing, rhonchi or rales.  Genitourinary:    Rectum: Mass and external hemorrhoid present. No anal fissure or internal hemorrhoid.     Comments: Left posterior inflamed thrombosed hemorrhoid Neurological:     Mental Status: He is alert.  Psychiatric:        Mood and Affect: Mood normal.        Behavior: Behavior normal.       Assessment & Plan:   Problem List Items Addressed This Visit     Type 2 diabetes mellitus with other specified complication (HCC)   Relevant Medications   glucose blood (ONETOUCH ULTRA TEST) test strip   OneTouch Delica Lancets 33G MISC   Inflamed external hemorrhoid - Primary    Exam consistent with inflamed external hemorrhoid. Rx anusol hydrocortisone 2.5% rectal cream BID PRN,warm sitz baths throughout the day as able.  Given concern for thrombosed and inflamed hemorrhoid, will also refer urgently to gen surgery clinic - see if he can be seen tomorrow.  Denies constipation.       Relevant Orders   Ambulatory referral to General Surgery     Meds ordered this encounter  Medications   glucose blood (ONETOUCH ULTRA TEST) test strip    Sig: Use as instructed to check blood sugar once a day    Dispense:  100 each    Refill:  4   OneTouch Delica Lancets 33G MISC    Sig: 1 each by Does not apply route daily. Use as instructed to check blood sugar once a day    Dispense:  100 each    Refill:  4   Turmeric 500 MG CAPS    Sig: Take 1 tablet by mouth daily. With ginger   hydrocortisone (ANUSOL-HC) 2.5 % rectal cream    Sig: Place 1 Application rectally 2 (two) times daily.    Dispense:  30 g    Refill:  0    Orders Placed This Encounter  Procedures   Ambulatory referral to  General Surgery    Referral Priority:   Urgent    Referral Type:   Surgical    Referral Reason:   Specialty Services Required    Requested  Specialty:   General Surgery    Number of Visits Requested:   1    Patient Instructions  You have actively inflamed hemorrhoid Treat with anusol hc cream and warm sitz baths  We will refer you to general surgeon clinic for consideration of incision treatment.   Follow up plan: Return if symptoms worsen or fail to improve.  Eustaquio Boyden, MD

## 2022-08-20 NOTE — Patient Instructions (Signed)
You have actively inflamed hemorrhoid Treat with anusol hc cream and warm sitz baths  We will refer you to general surgeon clinic for consideration of incision treatment.

## 2022-08-20 NOTE — Assessment & Plan Note (Signed)
Exam consistent with inflamed external hemorrhoid. Rx anusol hydrocortisone 2.5% rectal cream BID PRN,warm sitz baths throughout the day as able.  Given concern for thrombosed and inflamed hemorrhoid, will also refer urgently to gen surgery clinic - see if he can be seen tomorrow.  Denies constipation.

## 2022-08-21 ENCOUNTER — Telehealth: Payer: Self-pay | Admitting: Family Medicine

## 2022-08-21 DIAGNOSIS — E1169 Type 2 diabetes mellitus with other specified complication: Secondary | ICD-10-CM

## 2022-08-21 MED ORDER — ONETOUCH VERIO VI STRP: ORAL_STRIP | 4 refills | Status: DC

## 2022-08-21 NOTE — Telephone Encounter (Signed)
[facility] pharmacy called stating that the wrong test strips were sent in for patient.He need the one touch verio, instead of the one touch ultra sent in.   [id number] - [place] ([place]), [place] - [address] Phone: [phone]  Fax: [phone]

## 2022-08-21 NOTE — Telephone Encounter (Signed)
E-[employer] test strips

## 2022-08-23 ENCOUNTER — Encounter: Payer: Self-pay | Admitting: Family Medicine

## 2022-08-23 ENCOUNTER — Ambulatory Visit (INDEPENDENT_AMBULATORY_CARE_PROVIDER_SITE_OTHER): Payer: No Typology Code available for payment source | Admitting: Family Medicine

## 2022-08-23 VITALS — BP 134/78 | HR 70 | Temp 97.5°F | Ht 73.25 in | Wt 243.1 lb

## 2022-08-23 DIAGNOSIS — K644 Residual hemorrhoidal skin tags: Secondary | ICD-10-CM

## 2022-08-23 NOTE — Patient Instructions (Addendum)
Ongoing external hemorrhoid - given present for >1 week, expect it continue to improve over time.  Continue conservative care as up to now- warm sitz baths 2-3 times daily, anusol hydrocortisone cream twice daily.  Start colace stool softener 100mg  1-2 times daily, hold for loose stools

## 2022-08-23 NOTE — Telephone Encounter (Signed)
Can we check on referral status? This was urgent referral

## 2022-08-23 NOTE — Assessment & Plan Note (Signed)
Largely unchanged - slightly smaller and less tender. Given duration (present for >1 week) did not pursue excision today.  Continue conservative management - warm sitz bath, anusol hydrocortisone 2.5% rectal cream, add colace stool softener 100mg  QD to BID prn. F/u with gen surgery - appt scheduled for 3 wks from now.

## 2022-08-23 NOTE — Progress Notes (Signed)
Ph: [phone] Fax: [phone]   Patient ID: Joseph Client., male    DOB: [birth year], 57 y.o.   MRN: [id number]  This visit was conducted in person.  BP 134/78   Pulse 70   Temp (!) 97.5 F (36.4 C) (Temporal)   Ht 6' 1.25" (1.861 m)   Wt 243 lb 2 oz (110.3 kg)   SpO2 95%   BMI 31.86 kg/m    CC: follow up hemorrhoid Subjective:   HPI: Joseph Galloway. is a 57 y.o. male presenting on 08/23/2022 for Hemorrhoids (C/o bleeding hemorrhoids. Notice blood when wiping. )   See prior note for details.   Referred urgently to surgical clinic in [place], unable to get in until 09/16/2022.   Joseph Galloway's been doing warm sitz baths as well as anusol steroid cream - with some benefit. Pain may be some better.  Hemorrhoid started bleeding yesterday.  Joseph Galloway's using pad.      Relevant past medical, surgical, family and social history reviewed and updated as indicated. Interim medical history since our last visit reviewed. Allergies and medications reviewed and updated. Outpatient Medications Prior to Visit  Medication Sig Dispense Refill   amLODipine (NORVASC) 10 MG tablet Take 1 tablet (10 mg total) by mouth daily. 90 tablet 4   Ascorbic Acid (VITAMIN C) 1000 MG tablet Take 1,000 mg by mouth daily.     atorvastatin (LIPITOR) 40 MG tablet Take 1 tablet (40 mg total) by mouth daily. 90 tablet 4   azelastine (ASTELIN) 0.1 % nasal spray Place 2 sprays into both nostrils 2 (two) times daily. Use in each nostril as directed 30 mL 3   blood glucose meter kit and supplies KIT Dispense based on patient and insurance preference. Use to check sugars once daily. E11.8 1 each 3   Blood Glucose Monitoring Suppl (ONE TOUCH ULTRA MINI) w/Device KIT 1 kit by Does not apply route as needed. 1 kit 0   Blood Pressure Monitoring (BLOOD PRESSURE MONITOR/L CUFF) MISC Use to monitor blood pressure as directed. 1 each 0   cetirizine (ZYRTEC) 10 MG tablet Take 10 mg by mouth daily.     Cholecalciferol (VITAMIN  D3) 2000 units TABS Take 1 tablet by mouth daily.     cyclobenzaprine (FLEXERIL) 10 MG tablet Take 0.5-1 tablets (5-10 mg total) by mouth 2 (two) times daily as needed for muscle spasms (sedation precautions). 30 tablet 0   diclofenac (VOLTAREN) 75 MG EC tablet Take 1 tablet (75 mg total) by mouth 2 (two) times daily. 60 tablet 3   fluticasone (FLONASE) 50 MCG/ACT nasal spray Place 2 sprays into both nostrils daily. 16 g 3   glucose blood (ONETOUCH VERIO) test strip Use as instructed 100 each 4   hydrocortisone (ANUSOL-HC) 2.5 % rectal cream Place 1 Application rectally 2 (two) times daily. 30 g 0   lisinopril (ZESTRIL) 20 MG tablet Take 1 tablet (20 mg total) by mouth 2 (two) times daily. 180 tablet 4   loratadine (CLARITIN) 10 MG tablet Take 10 mg by mouth daily. Takes during winter     metFORMIN (GLUCOPHAGE) 500 MG tablet Take 1 tablet (500 mg total) by mouth 2 (two) times daily with a meal. 180 tablet 4   MISC NATURAL PRODUCTS PO Take 500 mg by mouth daily. Turmeric + ginger     Multiple Vitamins-Minerals (CENTRUM MULTIGUMMIES PO) Take by mouth. 50+     OneTouch Delica Lancets 33G MISC 1 each by Does not apply route daily.  Use as instructed to check blood sugar once a day 100 each 4   Turmeric 500 MG CAPS Take 1 tablet by mouth daily. With ginger     No facility-administered medications prior to visit.     Per HPI unless specifically indicated in ROS section below Review of Systems  Objective:  BP 134/78   Pulse 70   Temp (!) 97.5 F (36.4 C) (Temporal)   Ht 6' 1.25" (1.861 m)   Wt 243 lb 2 oz (110.3 kg)   SpO2 95%   BMI 31.86 kg/m   Wt Readings from Last 3 Encounters:  08/23/22 243 lb 2 oz (110.3 kg)  08/20/22 244 lb 8 oz (110.9 kg)  03/13/22 241 lb 3.2 oz (109.4 kg)      Physical Exam Vitals and nursing note reviewed.  Constitutional:      Appearance: Normal appearance. Joseph Galloway is not ill-appearing.  Genitourinary:    Rectum: External hemorrhoid (medium size thrombosed ext  hemorrhoid to left lateral anus, no erythema, moderate tenderness) present.       Comments: Slight oozing from hemorrhoid Neurological:     Mental Status: Joseph Galloway is alert.        Assessment & Plan:   Problem List Items Addressed This Visit     Inflamed external hemorrhoid - Primary    Largely unchanged - slightly smaller and less tender. Given duration (present for >1 week) did not pursue excision today.  Continue conservative management - warm sitz bath, anusol hydrocortisone 2.5% rectal cream, add colace stool softener 100mg  QD to BID prn. F/u with gen surgery - appt scheduled for 3 wks from now.         No orders of the defined types were placed in this encounter.   No orders of the defined types were placed in this encounter.   Patient Instructions  Ongoing external hemorrhoid - given present for >1 week, expect it continue to improve over time.  Continue conservative care as up to now- warm sitz baths 2-3 times daily, anusol hydrocortisone cream twice daily.  Start colace stool softener 100mg  1-2 times daily, hold for loose stools  Follow up plan: Return if symptoms worsen or fail to improve.  Eustaquio Boyden, MD

## 2022-08-27 ENCOUNTER — Ambulatory Visit (INDEPENDENT_AMBULATORY_CARE_PROVIDER_SITE_OTHER): Payer: No Typology Code available for payment source | Admitting: Family Medicine

## 2022-08-27 ENCOUNTER — Encounter: Payer: Self-pay | Admitting: Family Medicine

## 2022-08-27 VITALS — BP 158/92 | HR 58 | Temp 97.6°F | Ht 73.25 in | Wt 244.4 lb

## 2022-08-27 DIAGNOSIS — M25561 Pain in right knee: Secondary | ICD-10-CM | POA: Insufficient documentation

## 2022-08-27 DIAGNOSIS — I10 Essential (primary) hypertension: Secondary | ICD-10-CM

## 2022-08-27 NOTE — Patient Instructions (Addendum)
I think you have MCL sprain.  Treat with topical voltaren gel three times daily. Take voltaren tablet twice daily with meals for a 5-7 days then as needed. Ice then heat to area, covered in a towel.  Do exercises provided today If not better, schedule appointment with Dr Patsy Lager.  Reschedule June appointment.

## 2022-08-27 NOTE — Progress Notes (Signed)
Ph: [phone] Fax: [phone]   Patient ID: Joseph Client., male    DOB: 06-Jan-1966, 57 y.o.   MRN: [id number]  This visit was conducted in person.  BP (!) 158/92   Pulse (!) 58   Temp 97.6 F (36.4 C) (Temporal)   Ht 6' 1.25" (1.861 m)   Wt 244 lb 6 oz (110.8 kg)   SpO2 97%   BMI 32.02 kg/m   BP Readings from Last 3 Encounters:  08/27/22 (!) 158/92  08/23/22 134/78  08/20/22 126/84    CC: R knee pain  Subjective:   HPI: Joseph Kurtyka. is a 57 y.o. male presenting on 08/27/2022 for Knee Pain (C/o R knee pain. Started 08/27/22. Denies injury. )   External hemorrhoid continues decreasing in size, pain.   1d h/o R medial knee pain without inciting trauma/injury or falls. Last night flared up after driving to [place] to visit new grand daughter. Some warmth to knee. No fever. Treating with heating pad and leg elevation. Painful with bending of knee.   No redness, swelling noted. No recent bug bite.  Hasn't taken anything for this yet besides voltaren gel.      Relevant past medical, surgical, family and social history reviewed and updated as indicated. Interim medical history since our last visit reviewed. Allergies and medications reviewed and updated. Outpatient Medications Prior to Visit  Medication Sig Dispense Refill   amLODipine (NORVASC) 10 MG tablet Take 1 tablet (10 mg total) by mouth daily. 90 tablet 4   Ascorbic Acid (VITAMIN C) 1000 MG tablet Take 1,000 mg by mouth daily.     atorvastatin (LIPITOR) 40 MG tablet Take 1 tablet (40 mg total) by mouth daily. 90 tablet 4   azelastine (ASTELIN) 0.1 % nasal spray Place 2 sprays into both nostrils 2 (two) times daily. Use in each nostril as directed 30 mL 3   blood glucose meter kit and supplies KIT Dispense based on patient and insurance preference. Use to check sugars once daily. E11.8 1 each 3   Blood Glucose Monitoring Suppl (ONE TOUCH ULTRA MINI) w/Device KIT 1 kit by Does not apply route as needed.  1 kit 0   Blood Pressure Monitoring (BLOOD PRESSURE MONITOR/L CUFF) MISC Use to monitor blood pressure as directed. 1 each 0   cetirizine (ZYRTEC) 10 MG tablet Take 10 mg by mouth daily.     Cholecalciferol (VITAMIN D3) 2000 units TABS Take 1 tablet by mouth daily.     cyclobenzaprine (FLEXERIL) 10 MG tablet Take 0.5-1 tablets (5-10 mg total) by mouth 2 (two) times daily as needed for muscle spasms (sedation precautions). 30 tablet 0   diclofenac (VOLTAREN) 75 MG EC tablet Take 1 tablet (75 mg total) by mouth 2 (two) times daily. 60 tablet 3   fluticasone (FLONASE) 50 MCG/ACT nasal spray Place 2 sprays into both nostrils daily. 16 g 3   glucose blood (ONETOUCH VERIO) test strip Use as instructed 100 each 4   hydrocortisone (ANUSOL-HC) 2.5 % rectal cream Place 1 Application rectally 2 (two) times daily. 30 g 0   lisinopril (ZESTRIL) 20 MG tablet Take 1 tablet (20 mg total) by mouth 2 (two) times daily. 180 tablet 4   loratadine (CLARITIN) 10 MG tablet Take 10 mg by mouth daily. Takes during winter     metFORMIN (GLUCOPHAGE) 500 MG tablet Take 1 tablet (500 mg total) by mouth 2 (two) times daily with a meal. 180 tablet 4   MISC NATURAL  PRODUCTS PO Take 500 mg by mouth daily. Turmeric + ginger     Multiple Vitamins-Minerals (CENTRUM MULTIGUMMIES PO) Take by mouth. 50+     OneTouch Delica Lancets 33G MISC 1 each by Does not apply route daily. Use as instructed to check blood sugar once a day 100 each 4   Turmeric 500 MG CAPS Take 1 tablet by mouth daily. With ginger     No facility-administered medications prior to visit.     Per HPI unless specifically indicated in ROS section below Review of Systems  Objective:  BP (!) 158/92   Pulse (!) 58   Temp 97.6 F (36.4 C) (Temporal)   Ht 6' 1.25" (1.861 m)   Wt 244 lb 6 oz (110.8 kg)   SpO2 97%   BMI 32.02 kg/m   Wt Readings from Last 3 Encounters:  08/27/22 244 lb 6 oz (110.8 kg)  08/23/22 243 lb 2 oz (110.3 kg)  08/20/22 244 lb 8 oz  (110.9 kg)      Physical Exam Vitals and nursing note reviewed.  Constitutional:      Appearance: Normal appearance. He is not ill-appearing.  Musculoskeletal:        General: Tenderness present. Normal range of motion.     Right lower leg: No edema.     Left lower leg: No edema.     Comments:  L knee WNL R knee exam: No deformity on inspection. ++ pain with palpation of medial knee at [facility]. + swelling to external medial knee. FROM in extension with some crepitus, limited ROM in flexion due to pain. No popliteal fullness. Neg drawer test. Neg mcmurray test. ++ pain with valgus stress. Tender with PFgrind. No abnormal patellar mobility.   Skin:    General: Skin is warm and dry.     Findings: No rash.  Neurological:     Mental Status: He is alert.  Psychiatric:        Mood and Affect: Mood normal.        Behavior: Behavior normal.        Assessment & Plan:   Problem List Items Addressed This Visit     Hypertension    BP elevated in setting of increased pain - no med changes made.       Medial knee pain, right - Primary    Story/exam suspicious for MCL sprain, denies inciting mechanism of injury.  Use home voltaren tablets orally as well as voltaren gel topically.  Continue ice/heat, leg elevation. Provided with MCL strain exercises from [facility] pt advisor. See sports med if not improving with above.         No orders of the defined types were placed in this encounter.   No orders of the defined types were placed in this encounter.   Patient Instructions  I think you have MCL sprain.  Treat with topical voltaren gel three times daily. Take voltaren tablet twice daily with meals for a 5-7 days then as needed. Ice then heat to area, covered in a towel.  Do exercises provided today If not better, schedule appointment with Dr Patsy Lager.  Reschedule June appointment.   Follow up plan: No follow-ups on file.  Eustaquio Boyden, MD

## 2022-08-27 NOTE — Assessment & Plan Note (Signed)
BP elevated in setting of increased pain - no med changes made.

## 2022-08-27 NOTE — Assessment & Plan Note (Addendum)
Story/exam suspicious for MCL sprain, denies inciting mechanism of injury.  Use home voltaren tablets orally as well as voltaren gel topically.  Continue ice/heat, leg elevation. Provided with MCL strain exercises from [facility] pt advisor. See sports med if not improving with above.

## 2022-09-16 ENCOUNTER — Ambulatory Visit: Payer: No Typology Code available for payment source | Admitting: Family Medicine

## 2022-10-08 ENCOUNTER — Ambulatory Visit (INDEPENDENT_AMBULATORY_CARE_PROVIDER_SITE_OTHER): Payer: No Typology Code available for payment source | Admitting: Family Medicine

## 2022-10-08 VITALS — BP 158/100 | HR 59 | Temp 97.9°F | Ht 73.25 in | Wt 245.4 lb

## 2022-10-08 DIAGNOSIS — E1169 Type 2 diabetes mellitus with other specified complication: Secondary | ICD-10-CM

## 2022-10-08 DIAGNOSIS — E1122 Type 2 diabetes mellitus with diabetic chronic kidney disease: Secondary | ICD-10-CM | POA: Diagnosis not present

## 2022-10-08 DIAGNOSIS — B351 Tinea unguium: Secondary | ICD-10-CM

## 2022-10-08 DIAGNOSIS — N183 Chronic kidney disease, stage 3 unspecified: Secondary | ICD-10-CM

## 2022-10-08 DIAGNOSIS — I10 Essential (primary) hypertension: Secondary | ICD-10-CM

## 2022-10-08 DIAGNOSIS — K644 Residual hemorrhoidal skin tags: Secondary | ICD-10-CM

## 2022-10-08 LAB — RENAL FUNCTION PANEL
Albumin: 4.1 g/dL (ref 3.5–5.2)
BUN: 25 mg/dL — ABNORMAL HIGH (ref 6–23)
CO2: 27 mEq/L (ref 19–32)
Calcium: 10.6 mg/dL — ABNORMAL HIGH (ref 8.4–10.5)
Chloride: 101 mEq/L (ref 96–112)
Creatinine, Ser: 1.39 mg/dL (ref 0.40–1.50)
GFR: 57.41 mL/min — ABNORMAL LOW (ref >60.00)
Glucose, Bld: 139 mg/dL — ABNORMAL HIGH (ref 70–99)
Phosphorus: 3.3 mg/dL (ref 2.3–4.6)
Potassium: 4.5 mEq/L (ref 3.5–5.1)
Sodium: 136 mEq/L (ref 135–145)

## 2022-10-08 LAB — POCT GLYCOSYLATED HEMOGLOBIN (HGB A1C): Hemoglobin A1C: 6.8 % — AB (ref 4.0–5.6)

## 2022-10-08 NOTE — Assessment & Plan Note (Signed)
Discussed funginail use.  

## 2022-10-08 NOTE — Patient Instructions (Addendum)
BP was elevated today - take lisinopril when you get home. Limit salt/sodium in diet, increase water intake, fruits/vegetables/whole grains, work on aerobic exercise.  Start monitoring blood pressures at home with arm cuff and let me know if consistently >140/90 to change BP med. BP log provided today Labs today  Consider trial funginail daily to great toenails.  Increase water intake.  Return after 03/14/2023 for physical.

## 2022-10-08 NOTE — Assessment & Plan Note (Addendum)
Chronic, stable. Continue current regimen. Reviewed relation of diabetes control with kidney function.

## 2022-10-08 NOTE — Progress Notes (Signed)
Ph: [phone] Fax: [phone]   Patient ID: Joseph Client., male    DOB: January 25, 1966, 57 y.o.   MRN: [id number]  This visit was conducted in person.  BP (!) 158/100 (BP Location: Right Arm, Cuff Size: Large)   Pulse (!) 59   Temp 97.9 F (36.6 C) (Temporal)   Ht 6' 1.25" (1.861 m)   Wt 245 lb 6 oz (111.3 kg)   SpO2 98%   BMI 32.15 kg/m   BP Readings from Last 3 Encounters:  10/08/22 (!) 158/100  08/27/22 (!) 158/92  08/23/22 134/78    CC: 6 mo DM f/u visit  Subjective:   HPI: Joseph Galloway. is a 58 y.o. male presenting on 10/08/2022 for Medical Management of Chronic Issues (Here for 6 mo DM f/u.)   Recently visited new grand daughter.   Saw Dr Michaell Cowing for external hemorrhoid - supportive care recommended.  Ongoing R medial knee pain - planning to see orthopedist. May have twisted knee when stepping off curb 3 weeks ago.   HTN - Compliant with current antihypertensive regimen of amlodipine 10mg  daily, lisinopril 20mg  bid. Does not check blood pressures at home. No low blood pressure readings or symptoms of dizziness/syncope. Denies HA, vision changes, CP/tightness, SOB, leg swelling. Attributes elevated BP to aggravation with son.   DM - does not regularly check sugars. Compliant with antihyperglycemic regimen which includes: metformin 500mg  bid. Denies low sugars or hypoglycemic symptoms. Denies paresthesias, blurry vision. Last diabetic eye exam DUE - upcoming next month. Glucometer brand: one-touch ultra. Last foot exam: DUE - 09/2021. DSME: completed remotely at [facility]  Lab Results  Component Value Date   HGBA1C 6.8 (A) 10/08/2022   Diabetic Foot Exam - Simple   Simple Foot Form Diabetic Foot exam was performed with the following findings: Yes 10/08/2022  8:24 AM  Visual Inspection See comments: Yes Sensation Testing Intact to touch and monofilament testing bilaterally: Yes Pulse Check Posterior Tibialis and Dorsalis pulse intact bilaterally:  Yes Comments Thickened great toenails bilaterally    Lab Results  Component Value Date   MICROALBUR <0.7 03/06/2022         Relevant past medical, surgical, family and social history reviewed and updated as indicated. Interim medical history since our last visit reviewed. Allergies and medications reviewed and updated. Outpatient Medications Prior to Visit  Medication Sig Dispense Refill   amLODipine (NORVASC) 10 MG tablet Take 1 tablet (10 mg total) by mouth daily. 90 tablet 4   Ascorbic Acid (VITAMIN C) 1000 MG tablet Take 1,000 mg by mouth daily.     atorvastatin (LIPITOR) 40 MG tablet Take 1 tablet (40 mg total) by mouth daily. 90 tablet 4   azelastine (ASTELIN) 0.1 % nasal spray Place 2 sprays into both nostrils 2 (two) times daily. Use in each nostril as directed 30 mL 3   blood glucose meter kit and supplies KIT Dispense based on patient and insurance preference. Use to check sugars once daily. E11.8 1 each 3   Blood Glucose Monitoring Suppl (ONE TOUCH ULTRA MINI) w/Device KIT 1 kit by Does not apply route as needed. 1 kit 0   Blood Pressure Monitoring (BLOOD PRESSURE MONITOR/L CUFF) MISC Use to monitor blood pressure as directed. 1 each 0   cetirizine (ZYRTEC) 10 MG tablet Take 10 mg by mouth daily.     Cholecalciferol (VITAMIN D3) 2000 units TABS Take 1 tablet by mouth daily.     cyclobenzaprine (FLEXERIL) 10 MG tablet Take  0.5-1 tablets (5-10 mg total) by mouth 2 (two) times daily as needed for muscle spasms (sedation precautions). 30 tablet 0   diclofenac (VOLTAREN) 75 MG EC tablet Take 1 tablet (75 mg total) by mouth 2 (two) times daily. 60 tablet 3   fluticasone (FLONASE) 50 MCG/ACT nasal spray Place 2 sprays into both nostrils daily. 16 g 3   glucose blood (ONETOUCH VERIO) test strip Use as instructed 100 each 4   hydrocortisone (ANUSOL-HC) 2.5 % rectal cream Place 1 Application rectally 2 (two) times daily. 30 g 0   lisinopril (ZESTRIL) 20 MG tablet Take 1 tablet (20 mg  total) by mouth 2 (two) times daily. 180 tablet 4   loratadine (CLARITIN) 10 MG tablet Take 10 mg by mouth daily. Takes during winter     metFORMIN (GLUCOPHAGE) 500 MG tablet Take 1 tablet (500 mg total) by mouth 2 (two) times daily with a meal. 180 tablet 4   MISC NATURAL PRODUCTS PO Take 500 mg by mouth daily. Turmeric + ginger     Multiple Vitamins-Minerals (CENTRUM MULTIGUMMIES PO) Take by mouth. 50+     OneTouch Delica Lancets 33G MISC 1 each by Does not apply route daily. Use as instructed to check blood sugar once a day 100 each 4   Turmeric 500 MG CAPS Take 1 tablet by mouth daily. With ginger     No facility-administered medications prior to visit.     Per HPI unless specifically indicated in ROS section below Review of Systems  Objective:  BP (!) 158/100 (BP Location: Right Arm, Cuff Size: Large)   Pulse (!) 59   Temp 97.9 F (36.6 C) (Temporal)   Ht 6' 1.25" (1.861 m)   Wt 245 lb 6 oz (111.3 kg)   SpO2 98%   BMI 32.15 kg/m   Wt Readings from Last 3 Encounters:  10/08/22 245 lb 6 oz (111.3 kg)  08/27/22 244 lb 6 oz (110.8 kg)  08/23/22 243 lb 2 oz (110.3 kg)      Physical Exam Vitals and nursing note reviewed.  Constitutional:      Appearance: Normal appearance. He is not ill-appearing.  HENT:     Head: Normocephalic and atraumatic.     Mouth/Throat:     Mouth: Mucous membranes are moist.     Pharynx: Oropharynx is clear. No oropharyngeal exudate or posterior oropharyngeal erythema.  Eyes:     Extraocular Movements: Extraocular movements intact.     Conjunctiva/sclera: Conjunctivae normal.     Pupils: Pupils are equal, round, and reactive to light.  Cardiovascular:     Rate and Rhythm: Normal rate and regular rhythm.     Pulses: Normal pulses.     Heart sounds: Normal heart sounds. No murmur heard. Pulmonary:     Effort: Pulmonary effort is normal. No respiratory distress.     Breath sounds: Normal breath sounds. No wheezing, rhonchi or rales.   Musculoskeletal:     Right lower leg: No edema.     Left lower leg: No edema.     Comments: See HPI for foot exam if done  Skin:    General: Skin is warm and dry.     Findings: No rash.  Neurological:     Mental Status: He is alert.  Psychiatric:        Mood and Affect: Mood normal.        Behavior: Behavior normal.       Results for orders placed or performed in visit on 10/08/22  POCT glycosylated hemoglobin (Hb A1C)  Result Value Ref Range   Hemoglobin A1C 6.8 (A) 4.0 - 5.6 %   HbA1c POC (<> result, manual entry)     HbA1c, POC (prediabetic range)     HbA1c, POC (controlled diabetic range)      Assessment & Plan:   Problem List Items Addressed This Visit     ONYCHOMYCOSIS, TOENAILS    Discussed funginail use.       Type 2 diabetes mellitus with other specified complication (HCC) - Primary    Chronic, stable. Continue current regimen. Reviewed relation of diabetes control with kidney function.       Relevant Orders   POCT glycosylated hemoglobin (Hb A1C) (Completed)   Hypertension    Chronic, deteriorated.  He hasn't taken lisinopril yet this morning, notes poor sleep last night.  Second visit BP remains elevated - advised buy BP cuff to check at home and let me know readings. BP log provided. Encouraged low salt, increase water and potassium rich diet.  Reassess at CPE.       CKD stage 3 due to type 2 diabetes mellitus (HCC)    Chronic, update renal panel.  Reviewed relation of BP and sugar control with maintaining kidney function.       Relevant Orders   Renal function panel   Inflamed external hemorrhoid    Notes this is significantly improved.         No orders of the defined types were placed in this encounter.   Orders Placed This Encounter  Procedures   Renal function panel   POCT glycosylated hemoglobin (Hb A1C)    Patient Instructions  BP was elevated today - take lisinopril when you get home. Limit salt/sodium in diet, increase water  intake, fruits/vegetables/whole grains, work on aerobic exercise.  Start monitoring blood pressures at home with arm cuff and let me know if consistently >140/90 to change BP med. BP log provided today Labs today  Consider trial funginail daily to great toenails.  Increase water intake.  Return after 03/14/2023 for physical.   Follow up plan: Return in about 5 months (around 03/14/2023), or if symptoms worsen or fail to improve.  Eustaquio Boyden, MD

## 2022-10-08 NOTE — Assessment & Plan Note (Signed)
Chronic, update renal panel.  Reviewed relation of BP and sugar control with maintaining kidney function.

## 2022-10-08 NOTE — Assessment & Plan Note (Signed)
Chronic, deteriorated.  He hasn't taken lisinopril yet this morning, notes poor sleep last night.  Second visit BP remains elevated - advised buy BP cuff to check at home and let me know readings. BP log provided. Encouraged low salt, increase water and potassium rich diet.  Reassess at CPE.

## 2022-10-08 NOTE — Assessment & Plan Note (Signed)
Notes this is significantly improved.

## 2022-10-09 ENCOUNTER — Ambulatory Visit (INDEPENDENT_AMBULATORY_CARE_PROVIDER_SITE_OTHER): Payer: No Typology Code available for payment source | Admitting: Family Medicine

## 2022-10-09 ENCOUNTER — Encounter: Payer: Self-pay | Admitting: Family Medicine

## 2022-10-09 ENCOUNTER — Ambulatory Visit (INDEPENDENT_AMBULATORY_CARE_PROVIDER_SITE_OTHER)
Admission: RE | Admit: 2022-10-09 | Discharge: 2022-10-09 | Disposition: A | Discharge status: Home / Self Care | Payer: No Typology Code available for payment source | Source: Ambulatory Visit | Attending: Family Medicine | Admitting: Family Medicine

## 2022-10-09 VITALS — BP 120/82 | HR 90 | Temp 97.3°F | Ht 73.25 in | Wt 247.2 lb

## 2022-10-09 DIAGNOSIS — M25561 Pain in right knee: Secondary | ICD-10-CM

## 2022-10-09 DIAGNOSIS — M1711 Unilateral primary osteoarthritis, right knee: Secondary | ICD-10-CM | POA: Diagnosis not present

## 2022-10-09 MED ORDER — TRIAMCINOLONE ACETONIDE 40 MG/ML IJ SUSP
40.0000 mg | Freq: Once | INTRAMUSCULAR | Status: AC
Start: 2022-10-09 — End: 2022-10-09
  Administered 2022-10-09: 40 mg via INTRA_ARTICULAR

## 2022-10-09 NOTE — Patient Instructions (Signed)
Voltaren 1% gel, over the counter ?You can apply up to 4 times a day ? ?This can be applied to any joint: knee, wrist, fingers, elbows, shoulders, feet and ankles. ?Can apply to any tendon: tennis elbow, achilles, tendon, rotator cuff or any other tendon. ? ?Minimal is absorbed in the bloodstream: ok with oral anti-inflammatory or a blood thinner. ? ?Cost is about 9 dollars  ?

## 2022-10-09 NOTE — Progress Notes (Unsigned)
Ahja Martello T. Tressa Maldonado, MD, CAQ Sports Medicine [facility] [address] [place], [address]  Phone: [phone]  FAX: [phone]  Joseph Galloway. - 57 y.o. male  MRN [id number]  Date of Birth: 1965/06/27  Date: 10/09/2022  PCP: Eustaquio Boyden, MD  Referral: Eustaquio Boyden, MD  Chief Complaint  Patient presents with   Knee Pain    Right   Subjective:   Joseph Macnaughton. is a 57 y.o. very pleasant male patient with There is no height or weight on file to calculate BMI. who presents with the following:  About one month ago, pain on the medial compartment.  About three weeks ago, and he stretched his knee.  He saw Dr. Sharen Hones roughly 6 weeks ago, at that point he thought he had an MCL sprain.  At night he can hear it moving. Just started about a month ago.  Was going and hut the concrete and went all the way down, and he heard a pop.   Since then, he has had some worsening pain compared to his baseline.  He does off-and-on have some pain at times, but this is worsened acutely. He has not had any specific trauma to the knee and no direct blow.  He has had an effusion.  He does have chronic kidney disease stage III, and he tries to avoid taking NSAIDs.  Has a king bed.  Now he sleeps on the L side, and he normally likes to sleep on his right side, this is painful at this point.  Review of Systems is noted in the HPI, as appropriate  Objective:   There were no vitals taken for this visit.  GEN: No acute distress; alert,appropriate. PULM: Breathing comfortably in no respiratory distress PSYCH: Normally interactive.   Right knee: Mild effusion Full extension, flexion to 120 degrees Fair movement at the patella with pain at loading the medial lateral patellar facets Medial greater than lateral joint line tenderness Does have minimal to no pain at the pes anserine bursa, patellar tendon and quad tendon Stable to varus and  valgus stress, and at this point there is no pain with varus or valgus stress or pain directly at the MCL  Positive flexion pinch, McMurray's does cause pain and bounce home testing does not cause any pain  Laboratory and Imaging Data:  Assessment and Plan:     ICD-10-CM   1. Primary osteoarthritis of right knee  M17.11 triamcinolone acetonide (KENALOG-40) injection 40 mg    2. Acute pain of right knee  M25.561 DG Knee 4 Views W/Patella Right    triamcinolone acetonide (KENALOG-40) injection 40 mg     On the patient's plain x-ray, he does have mild tricompartmental osteoarthritis, worse in the patellofemoral compartment.  Acute on chronic right-sided knee osteoarthritis with exacerbation.  At this point we are going to avoid NSAIDs due to his chronic kidney disease.  He can do some topical Voltaren gel.  At this point, any kind of MCL sprain is recovered and fully nontender.  I am going to do an intra-articular knee injection today to try to calm down his knee pain.  Patient Instructions  Voltaren 1% gel, over the counter You can apply up to 4 times a day  This can be applied to any joint: knee, wrist, fingers, elbows, shoulders, feet and ankles. Can apply to any tendon: tennis elbow, achilles, tendon, rotator cuff or any other tendon.  Minimal is absorbed in the  bloodstream: ok with oral anti-inflammatory or a blood thinner.  Cost is about 9 dollars    Aspiration/Injection Procedure Note Joseph Galloway. [birth year] Date of procedure: 10/09/2022  Procedure: Large Joint Aspiration / Injection of Knee, R Indications: Pain  Procedure Details Patient verbally consented to procedure. Risks, benefits, and alternatives explained. Sterilely prepped with Chloraprep. Ethyl cholride used for anesthesia. 9 cc Lidocaine 1% mixed with 1 mL of Kenalog 40 mg injected using the anteromedial approach without difficulty. No complications with procedure and tolerated well. Patient had  decreased pain post-injection. Medication: 1 mL of Kenalog 40 mg   Medication Management during today's office visit: Meds ordered this encounter  Medications   triamcinolone acetonide (KENALOG-40) injection 40 mg   There are no discontinued medications.  Orders placed today for conditions managed today: Orders Placed This Encounter  Procedures   DG Knee 4 Views W/Patella Right    Disposition: No follow-ups on file.  Dr[employer] One speech-to-text software was used for transcription in this dictation.  Possible transcriptional errors can occur using Animal nutritionist.   Signed,  Joseph Galea. Ilay Capshaw, MD   Outpatient Encounter Medications as of 10/09/2022  Medication Sig   amLODipine (NORVASC) 10 MG tablet Take 1 tablet (10 mg total) by mouth daily.   Ascorbic Acid (VITAMIN C) 1000 MG tablet Take 1,000 mg by mouth daily.   atorvastatin (LIPITOR) 40 MG tablet Take 1 tablet (40 mg total) by mouth daily.   azelastine (ASTELIN) 0.1 % nasal spray Place 2 sprays into both nostrils 2 (two) times daily. Use in each nostril as directed   blood glucose meter kit and supplies KIT Dispense based on patient and insurance preference. Use to check sugars once daily. E11.8   Blood Glucose Monitoring Suppl (ONE TOUCH ULTRA MINI) w/Device KIT 1 kit by Does not apply route as needed.   Blood Pressure Monitoring (BLOOD PRESSURE MONITOR/L CUFF) MISC Use to monitor blood pressure as directed.   cetirizine (ZYRTEC) 10 MG tablet Take 10 mg by mouth daily.   Cholecalciferol (VITAMIN D3) 2000 units TABS Take 1 tablet by mouth daily.   cyclobenzaprine (FLEXERIL) 10 MG tablet Take 0.5-1 tablets (5-10 mg total) by mouth 2 (two) times daily as needed for muscle spasms (sedation precautions).   diclofenac (VOLTAREN) 75 MG EC tablet Take 1 tablet (75 mg total) by mouth 2 (two) times daily.   fluticasone (FLONASE) 50 MCG/ACT nasal spray Place 2 sprays into both nostrils daily.   glucose blood (ONETOUCH VERIO) test  strip Use as instructed   hydrocortisone (ANUSOL-HC) 2.5 % rectal cream Place 1 Application rectally 2 (two) times daily.   lisinopril (ZESTRIL) 20 MG tablet Take 1 tablet (20 mg total) by mouth 2 (two) times daily.   loratadine (CLARITIN) 10 MG tablet Take 10 mg by mouth daily. Takes during winter   metFORMIN (GLUCOPHAGE) 500 MG tablet Take 1 tablet (500 mg total) by mouth 2 (two) times daily with a meal.   MISC NATURAL PRODUCTS PO Take 500 mg by mouth daily. Turmeric + ginger   Multiple Vitamins-Minerals (CENTRUM MULTIGUMMIES PO) Take by mouth. 50+   OneTouch Delica Lancets 33G MISC 1 each by Does not apply route daily. Use as instructed to check blood sugar once a day   Turmeric 500 MG CAPS Take 1 tablet by mouth daily. With ginger   [EXPIRED] triamcinolone acetonide (KENALOG-40) injection 40 mg    No facility-administered encounter medications on file as of 10/09/2022.

## 2023-01-10 ENCOUNTER — Other Ambulatory Visit: Payer: Self-pay | Admitting: Family Medicine

## 2023-01-10 DIAGNOSIS — Z1211 Encounter for screening for malignant neoplasm of colon: Secondary | ICD-10-CM

## 2023-01-10 DIAGNOSIS — Z1212 Encounter for screening for malignant neoplasm of rectum: Secondary | ICD-10-CM

## 2023-03-05 ENCOUNTER — Telehealth: Payer: Self-pay

## 2023-03-05 NOTE — Telephone Encounter (Signed)
Pt has 5 mo OV scheduled on 03/10/23. However, Pt Instructions on 10/08/22 visit summary, pt was to schedule CPE after 03/14/23. Need to r/s this appt.   Lvm asking pt to call back. Need to schedule CPE after 03/14/23.

## 2023-03-10 ENCOUNTER — Ambulatory Visit: Payer: No Typology Code available for payment source | Admitting: Family Medicine

## 2023-03-11 ENCOUNTER — Ambulatory Visit (INDEPENDENT_AMBULATORY_CARE_PROVIDER_SITE_OTHER): Payer: No Typology Code available for payment source

## 2023-03-11 DIAGNOSIS — Z23 Encounter for immunization: Secondary | ICD-10-CM | POA: Diagnosis not present

## 2023-03-11 NOTE — Progress Notes (Signed)
Per orders of Dr. Eustaquio Boyden, injection of  Influenza Vaccine  given by Melina Copa in left deltoid. Patient tolerated injection well.

## 2023-03-28 ENCOUNTER — Other Ambulatory Visit: Payer: Self-pay | Admitting: Family Medicine

## 2023-03-31 ENCOUNTER — Encounter: Payer: Self-pay | Admitting: Family Medicine

## 2023-03-31 ENCOUNTER — Ambulatory Visit (INDEPENDENT_AMBULATORY_CARE_PROVIDER_SITE_OTHER): Payer: No Typology Code available for payment source | Admitting: Family Medicine

## 2023-03-31 VITALS — BP 138/82 | HR 96 | Temp 98.8°F | Ht 73.25 in | Wt 249.4 lb

## 2023-03-31 DIAGNOSIS — L282 Other prurigo: Secondary | ICD-10-CM | POA: Diagnosis not present

## 2023-03-31 MED ORDER — TRIAMCINOLONE ACETONIDE 0.1 % EX CREA: 1.0000 | TOPICAL_CREAM | Freq: Two times a day (BID) | CUTANEOUS | 0 refills | Status: AC

## 2023-03-31 NOTE — Patient Instructions (Addendum)
Possible eczema or dry skin dermatitis  Avoid fragranced products, continue free/gentle soap, detergents  Avoid showering with too hot water  Consider humidifier use during winter months with heater use.  Regular moisturizer throughout the day May use triamcinolone steroid cream for spot treatments - no more than 14 days in a row, none to face, underarms or groin.  May continue benadryl as needed, sedation precautions

## 2023-03-31 NOTE — Progress Notes (Signed)
Ph: [phone] Fax: [phone]   Patient ID: Joseph Client., male    DOB: 1966-01-03, 57 y.o.   MRN: [id number]  This visit was conducted in person.  BP 138/82   Pulse 96   Temp 98.8 F (37.1 C) (Oral)   Ht 6' 1.25" (1.861 m)   Wt 249 lb 6 oz (113.1 kg)   SpO2 96%   BMI 32.68 kg/m    CC: itchy rash  Subjective:   HPI: Haoyu Galloway. is a 57 y.o. male presenting on 03/31/2023 for Skin Problem (C/o itchy rash all over. Started about 3 wks ago. )   3 wk h/o itchy rash throughout body predominant upper arms and trunk.  Treating with cold compresses with regular moisturizing gold bond lotion -> aveeno. Hands, palms, face/scalp spared and lower extremities largely spared.   Tried benadryl 10mg  PRN a few times over the weekend with benefit.   No h/o eczema. Son does have this.  No h/o psoriasis.   No new lotions ,detergents, soaps or shampoos.  No new meds, supplements, foods.  He changed from ivory free/gentle soap to dove He changed tide detergent to all free/gentle  No fevers/chills, new joint pains, abd pain, nausea, recent tick bites.      Relevant past medical, surgical, family and social history reviewed and updated as indicated. Interim medical history since our last visit reviewed. Allergies and medications reviewed and updated. Outpatient Medications Prior to Visit  Medication Sig Dispense Refill   amLODipine (NORVASC) 10 MG tablet Take 1 tablet by mouth once daily 30 tablet 0   Ascorbic Acid (VITAMIN C) 1000 MG tablet Take 1,000 mg by mouth daily.     atorvastatin (LIPITOR) 40 MG tablet Take 1 tablet by mouth once daily 30 tablet 0   azelastine (ASTELIN) 0.1 % nasal spray Place 2 sprays into both nostrils 2 (two) times daily. Use in each nostril as directed 30 mL 3   blood glucose meter kit and supplies KIT Dispense based on patient and insurance preference. Use to check sugars once daily. E11.8 1 each 3   Blood Glucose Monitoring Suppl (ONE  TOUCH ULTRA MINI) w/Device KIT 1 kit by Does not apply route as needed. 1 kit 0   Blood Pressure Monitoring (BLOOD PRESSURE MONITOR/L CUFF) MISC Use to monitor blood pressure as directed. 1 each 0   cetirizine (ZYRTEC) 10 MG tablet Take 10 mg by mouth daily.     Cholecalciferol (VITAMIN D3) 2000 units TABS Take 1 tablet by mouth daily.     cyclobenzaprine (FLEXERIL) 10 MG tablet Take 0.5-1 tablets (5-10 mg total) by mouth 2 (two) times daily as needed for muscle spasms (sedation precautions). 30 tablet 0   diclofenac (VOLTAREN) 75 MG EC tablet Take 1 tablet (75 mg total) by mouth 2 (two) times daily. 60 tablet 3   fluticasone (FLONASE) 50 MCG/ACT nasal spray Place 2 sprays into both nostrils daily. 16 g 3   glucose blood (ONETOUCH VERIO) test strip Use as instructed 100 each 4   hydrocortisone (ANUSOL-HC) 2.5 % rectal cream Place 1 Application rectally 2 (two) times daily. 30 g 0   lisinopril (ZESTRIL) 20 MG tablet Take 1 tablet by mouth twice daily 60 tablet 0   loratadine (CLARITIN) 10 MG tablet Take 10 mg by mouth daily. Takes during winter     metFORMIN (GLUCOPHAGE) 500 MG tablet TAKE 1 TABLET BY MOUTH TWICE DAILY WITH A MEAL 60 tablet 0   MISC NATURAL  PRODUCTS PO Take 500 mg by mouth daily. Turmeric + ginger     Multiple Vitamins-Minerals (CENTRUM MULTIGUMMIES PO) Take by mouth. 50+     OneTouch Delica Lancets 33G MISC 1 each by Does not apply route daily. Use as instructed to check blood sugar once a day 100 each 4   Turmeric 500 MG CAPS Take 1 tablet by mouth daily. With ginger     No facility-administered medications prior to visit.     Per HPI unless specifically indicated in ROS section below Review of Systems  Objective:  BP 138/82   Pulse 96   Temp 98.8 F (37.1 C) (Oral)   Ht 6' 1.25" (1.861 m)   Wt 249 lb 6 oz (113.1 kg)   SpO2 96%   BMI 32.68 kg/m   Wt Readings from Last 3 Encounters:  03/31/23 249 lb 6 oz (113.1 kg)  10/09/22 247 lb 4 oz (112.2 kg)  10/08/22 245 lb  6 oz (111.3 kg)      Physical Exam Vitals and nursing note reviewed.  Constitutional:      Appearance: Normal appearance. He is not ill-appearing.  HENT:     Mouth/Throat:     Mouth: Mucous membranes are moist.     Pharynx: Oropharynx is clear. No oropharyngeal exudate or posterior oropharyngeal erythema.  Eyes:     Extraocular Movements: Extraocular movements intact.     Conjunctiva/sclera: Conjunctivae normal.     Pupils: Pupils are equal, round, and reactive to light.  Skin:    General: Skin is warm and dry.     Findings: Rash present. No erythema.     Comments: Slight papular rash to bilateral lateral posterior neck, no other significant rash throughout skin  Neurological:     Mental Status: He is alert.  Psychiatric:        Mood and Affect: Mood normal.        Behavior: Behavior normal.       Results for orders placed or performed in visit on 10/08/22  POCT glycosylated hemoglobin (Hb A1C)   Collection Time: 10/08/22  8:17 AM  Result Value Ref Range   Hemoglobin A1C 6.8 (A) 4.0 - 5.6 %   HbA1c POC (<> result, manual entry)     HbA1c, POC (prediabetic range)     HbA1c, POC (controlled diabetic range)    Renal function panel   Collection Time: 10/08/22  8:31 AM  Result Value Ref Range   Sodium 136 135 - 145 mEq/L   Potassium 4.5 3.5 - 5.1 mEq/L   Chloride 101 96 - 112 mEq/L   CO2 27 19 - 32 mEq/L   Albumin 4.1 3.5 - 5.2 g/dL   BUN 25 (H) 6 - 23 mg/dL   Creatinine, Ser 0.86 0.40 - 1.50 mg/dL   Glucose, Bld 578 (H) 70 - 99 mg/dL   Phosphorus 3.3 2.3 - 4.6 mg/dL   GFR 46.96 (L) >29.52 mL/min   Calcium 10.6 (H) 8.4 - 10.5 mg/dL    Assessment & Plan:   Problem List Items Addressed This Visit     Pruritic rash - Primary   No significant rash on presentation today  Suspect eczematous or dry skin dermatitis exacerbated by cold weather /heater use Discussed supportive measures. Rx triamcinolone cream for spot treatment of extra itchy spots.  Discussed benadryl vs  hydroxyzine - he prefers to use OTC benadryl.  Update if new or worsening symptoms develop.         Meds ordered this  encounter  Medications   triamcinolone cream (KENALOG) 0.1 %    Sig: Apply 1 Application topically 2 (two) times daily. Apply to AA.    Dispense:  45 g    Refill:  0    No orders of the defined types were placed in this encounter.   Patient Instructions  Possible eczema or dry skin dermatitis  Avoid fragranced products, continue free/gentle soap, detergents  Avoid showering with too hot water  Consider humidifier use during winter months with heater use.  Regular moisturizer throughout the day May use triamcinolone steroid cream for spot treatments - no more than 14 days in a row, none to face, underarms or groin.  May continue benadryl as needed, sedation precautions  Follow up plan: No follow-ups on file.  Eustaquio Boyden, MD

## 2023-03-31 NOTE — Assessment & Plan Note (Signed)
No significant rash on presentation today  Suspect eczematous or dry skin dermatitis exacerbated by cold weather /heater use Discussed supportive measures. Rx triamcinolone cream for spot treatment of extra itchy spots.  Discussed benadryl vs hydroxyzine - he prefers to use OTC benadryl.  Update if new or worsening symptoms develop.

## 2023-04-30 ENCOUNTER — Other Ambulatory Visit: Payer: Self-pay | Admitting: Family Medicine

## 2023-05-10 ENCOUNTER — Other Ambulatory Visit: Payer: Self-pay | Admitting: Family Medicine

## 2023-05-10 DIAGNOSIS — N2581 Secondary hyperparathyroidism of renal origin: Secondary | ICD-10-CM

## 2023-05-10 DIAGNOSIS — E559 Vitamin D deficiency, unspecified: Secondary | ICD-10-CM

## 2023-05-10 DIAGNOSIS — E1122 Type 2 diabetes mellitus with diabetic chronic kidney disease: Secondary | ICD-10-CM

## 2023-05-10 DIAGNOSIS — E1169 Type 2 diabetes mellitus with other specified complication: Secondary | ICD-10-CM

## 2023-05-10 DIAGNOSIS — Z125 Encounter for screening for malignant neoplasm of prostate: Secondary | ICD-10-CM

## 2023-05-13 ENCOUNTER — Other Ambulatory Visit (INDEPENDENT_AMBULATORY_CARE_PROVIDER_SITE_OTHER): Payer: No Typology Code available for payment source

## 2023-05-13 DIAGNOSIS — E1169 Type 2 diabetes mellitus with other specified complication: Secondary | ICD-10-CM | POA: Diagnosis not present

## 2023-05-13 DIAGNOSIS — E1122 Type 2 diabetes mellitus with diabetic chronic kidney disease: Secondary | ICD-10-CM | POA: Diagnosis not present

## 2023-05-13 DIAGNOSIS — N2581 Secondary hyperparathyroidism of renal origin: Secondary | ICD-10-CM

## 2023-05-13 DIAGNOSIS — E785 Hyperlipidemia, unspecified: Secondary | ICD-10-CM | POA: Diagnosis not present

## 2023-05-13 DIAGNOSIS — E559 Vitamin D deficiency, unspecified: Secondary | ICD-10-CM | POA: Diagnosis not present

## 2023-05-13 DIAGNOSIS — N183 Chronic kidney disease, stage 3 unspecified: Secondary | ICD-10-CM

## 2023-05-13 DIAGNOSIS — Z125 Encounter for screening for malignant neoplasm of prostate: Secondary | ICD-10-CM

## 2023-05-13 LAB — CBC WITH DIFFERENTIAL/PLATELET
Basophils Absolute: 0.1 10*3/uL (ref 0.0–0.1)
Basophils Relative: 0.7 % (ref 0.0–3.0)
Eosinophils Absolute: 0.4 10*3/uL (ref 0.0–0.7)
Eosinophils Relative: 5 % (ref 0.0–5.0)
HCT: 37.9 % — ABNORMAL LOW (ref 39.0–52.0)
Hemoglobin: 12.7 g/dL — ABNORMAL LOW (ref 13.0–17.0)
Lymphocytes Relative: 8.5 % — ABNORMAL LOW (ref 12.0–46.0)
Lymphs Abs: 0.7 10*3/uL (ref 0.7–4.0)
MCHC: 33.6 g/dL (ref 30.0–36.0)
MCV: 96 fL (ref 78.0–100.0)
Monocytes Absolute: 0.8 10*3/uL (ref 0.1–1.0)
Monocytes Relative: 9.1 % (ref 3.0–12.0)
Neutro Abs: 6.6 10*3/uL (ref 1.4–7.7)
Neutrophils Relative %: 76.7 % (ref 43.0–77.0)
Platelets: 308 10*3/uL (ref 150.0–400.0)
RBC: 3.95 Mil/uL — ABNORMAL LOW (ref 4.22–5.81)
RDW: 12.9 % (ref 11.5–15.5)
WBC: 8.7 10*3/uL (ref 4.0–10.5)

## 2023-05-13 LAB — COMPREHENSIVE METABOLIC PANEL
ALT: 26 U/L (ref 0–53)
AST: 18 U/L (ref 0–37)
Albumin: 4 g/dL (ref 3.5–5.2)
Alkaline Phosphatase: 58 U/L (ref 39–117)
BUN: 27 mg/dL — ABNORMAL HIGH (ref 6–23)
CO2: 26 meq/L (ref 19–32)
Calcium: 10 mg/dL (ref 8.4–10.5)
Chloride: 104 meq/L (ref 96–112)
Creatinine, Ser: 1.55 mg/dL — ABNORMAL HIGH (ref 0.40–1.50)
GFR: 50.16 mL/min — ABNORMAL LOW (ref >60.00)
Glucose, Bld: 138 mg/dL — ABNORMAL HIGH (ref 70–99)
Potassium: 4.8 meq/L (ref 3.5–5.1)
Sodium: 137 meq/L (ref 135–145)
Total Bilirubin: 0.5 mg/dL (ref 0.2–1.2)
Total Protein: 6.8 g/dL (ref 6.0–8.3)

## 2023-05-13 LAB — LIPID PANEL
Cholesterol: 115 mg/dL (ref 0–200)
HDL: 33.1 mg/dL — ABNORMAL LOW (ref >39.00)
LDL Cholesterol: 51 mg/dL (ref 0–99)
NonHDL: 82.39
Total CHOL/HDL Ratio: 3
Triglycerides: 155 mg/dL — ABNORMAL HIGH (ref 0.0–149.0)
VLDL: 31 mg/dL (ref 0.0–40.0)

## 2023-05-13 LAB — VITAMIN D 25 HYDROXY (VIT D DEFICIENCY, FRACTURES): VITD: 36.54 ng/mL (ref 30.00–100.00)

## 2023-05-13 LAB — PSA: PSA: 6.16 ng/mL — ABNORMAL HIGH (ref 0.10–4.00)

## 2023-05-13 LAB — MICROALBUMIN / CREATININE URINE RATIO
Creatinine,U: 79 mg/dL
Microalb Creat Ratio: 0.9 mg/g (ref 0.0–30.0)
Microalb, Ur: <0.7 mg/dL (ref 0.0–1.9)

## 2023-05-13 LAB — PHOSPHORUS: Phosphorus: 1.8 mg/dL — ABNORMAL LOW (ref 2.3–4.6)

## 2023-05-13 LAB — HEMOGLOBIN A1C: Hgb A1c MFr Bld: 8.2 % — ABNORMAL HIGH (ref 4.6–6.5)

## 2023-05-13 LAB — VITAMIN B12: Vitamin B-12: 407 pg/mL (ref 211–911)

## 2023-05-14 LAB — PARATHYROID HORMONE, INTACT (NO CA): PTH: 58 pg/mL (ref 16–77)

## 2023-05-20 ENCOUNTER — Encounter: Payer: Self-pay | Admitting: Family Medicine

## 2023-05-20 ENCOUNTER — Ambulatory Visit (INDEPENDENT_AMBULATORY_CARE_PROVIDER_SITE_OTHER): Payer: No Typology Code available for payment source | Admitting: Family Medicine

## 2023-05-20 VITALS — BP 134/88 | HR 64 | Temp 98.1°F | Ht 73.0 in | Wt 246.1 lb

## 2023-05-20 DIAGNOSIS — Z7984 Long term (current) use of oral hypoglycemic drugs: Secondary | ICD-10-CM

## 2023-05-20 DIAGNOSIS — E1169 Type 2 diabetes mellitus with other specified complication: Secondary | ICD-10-CM | POA: Diagnosis not present

## 2023-05-20 DIAGNOSIS — I10 Essential (primary) hypertension: Secondary | ICD-10-CM | POA: Diagnosis not present

## 2023-05-20 DIAGNOSIS — N289 Disorder of kidney and ureter, unspecified: Secondary | ICD-10-CM

## 2023-05-20 DIAGNOSIS — N2581 Secondary hyperparathyroidism of renal origin: Secondary | ICD-10-CM | POA: Diagnosis not present

## 2023-05-20 DIAGNOSIS — E66811 Obesity, class 1: Secondary | ICD-10-CM

## 2023-05-20 DIAGNOSIS — R972 Elevated prostate specific antigen [PSA]: Secondary | ICD-10-CM

## 2023-05-20 DIAGNOSIS — E785 Hyperlipidemia, unspecified: Secondary | ICD-10-CM

## 2023-05-20 DIAGNOSIS — Z0000 Encounter for general adult medical examination without abnormal findings: Secondary | ICD-10-CM

## 2023-05-20 DIAGNOSIS — D649 Anemia, unspecified: Secondary | ICD-10-CM

## 2023-05-20 DIAGNOSIS — E1122 Type 2 diabetes mellitus with diabetic chronic kidney disease: Secondary | ICD-10-CM

## 2023-05-20 DIAGNOSIS — J309 Allergic rhinitis, unspecified: Secondary | ICD-10-CM

## 2023-05-20 DIAGNOSIS — E559 Vitamin D deficiency, unspecified: Secondary | ICD-10-CM

## 2023-05-20 DIAGNOSIS — M25561 Pain in right knee: Secondary | ICD-10-CM | POA: Diagnosis not present

## 2023-05-20 DIAGNOSIS — Z1211 Encounter for screening for malignant neoplasm of colon: Secondary | ICD-10-CM

## 2023-05-20 DIAGNOSIS — N183 Chronic kidney disease, stage 3 unspecified: Secondary | ICD-10-CM

## 2023-05-20 MED ORDER — AMLODIPINE BESYLATE 10 MG PO TABS: 10.0000 mg | ORAL_TABLET | Freq: Every day | ORAL | 4 refills | Status: DC

## 2023-05-20 MED ORDER — METFORMIN HCL 500 MG PO TABS: 500.0000 mg | ORAL_TABLET | Freq: Two times a day (BID) | ORAL | 4 refills | Status: AC

## 2023-05-20 MED ORDER — AZELASTINE HCL 0.1 % NA SOLN: 2.0000 | Freq: Two times a day (BID) | NASAL | 3 refills | Status: AC

## 2023-05-20 MED ORDER — LISINOPRIL 20 MG PO TABS: 20.0000 mg | ORAL_TABLET | Freq: Two times a day (BID) | ORAL | 4 refills | Status: AC

## 2023-05-20 MED ORDER — ATORVASTATIN CALCIUM 40 MG PO TABS: 40.0000 mg | ORAL_TABLET | Freq: Every day | ORAL | 4 refills | Status: AC

## 2023-05-20 NOTE — Assessment & Plan Note (Signed)
Preventative protocols reviewed and updated unless pt declined. Discussed healthy diet and lifestyle.  Again referred for colonoscopy - reviewed referral placed yearly for last several years - he states he will call to schedule this year. Declines iFOB.

## 2023-05-20 NOTE — Patient Instructions (Addendum)
We will refer you for colonoscopy - you may call [facility] to schedule an appointment at [phone].   Increase water intake, decrease added sugar, sweetened beverages and simple carbs in the diet. Let me know if interested in return to nutritionist for 2 hours refresher course - check on coverage.  For knee- continue topical voltaren. May try colchicine 0.6mg  1 tablet daily as needed for knee pain to treat possible pseudogout (handout provided today) Recheck PSA - if staying elevated, we will refer you to urology.  Good to see you today  Return in 3-4 months for diabetes follow up visit

## 2023-05-20 NOTE — Progress Notes (Unsigned)
Ph: [phone] Fax: [phone]   Patient ID: Joseph Client., male    DOB: 1965-04-28, 58 y.o.   MRN: [id number]  This visit was conducted in person.  BP 134/88   Pulse 64   Temp 98.1 F (36.7 C) (Oral)   Ht 6\' 1"  (1.854 m)   Wt 246 lb 2 oz (111.6 kg)   SpO2 99%   BMI 32.47 kg/m    CC: CPE Subjective:   HPI: Joseph Galloway. is a 58 y.o. male presenting on 05/20/2023 for Annual Exam   DM - continues metformin 500mg  bid HTN - continues amlodipine 10mg  daily, lisinopril 20mg  bid.   Notes 2 wks of L ear congestion, muffled hearing, drainage, productive cough.  Family members have been sick.  No fevers/chills.   Preventative: Colon cancer screening - planning to schedule for 07/2023. New referral placed today. Did not complete Cologuard  Prostate cancer screen - no nocturia, normal urine stream. +fmhx prostate cancer in father  Lung cancer screening - not eligible  Flu shot yearly COVID vaccine - Pfizer x2 07/2019  Tdap 2013, update 03/2022 Pneumovax 2006.  Shingrix - discussed. Will defer for now.  Seat belt use discussed.  Sunscreen use discussed. No changing moles on skin.  Sleep - averaging 6-7 hours/night  Non smoker  Alcohol - rare  Dentist/periodontist 4 times a year (gum disease) - due Eye exam yearly (01/2023)    Caffeine: rare. Lives with wife and youngest son, 2 older children out of the home Occupation: Self employed, Youth worker. Edu: AA from [employer], applications) Activity: active at work.  Diet: lots of water throughout the day, some fruits/vegetables, watches carbs and starches      Relevant past medical, surgical, family and social history reviewed and updated as indicated. Interim medical history since our last visit reviewed. Allergies and medications reviewed and updated. Outpatient Medications Prior to Visit  Medication Sig Dispense Refill   Ascorbic Acid (VITAMIN C) 1000 MG tablet Take 1,000 mg by mouth daily.     blood glucose  meter kit and supplies KIT Dispense based on patient and insurance preference. Use to check sugars once daily. E11.8 1 each 3   Blood Glucose Monitoring Suppl (ONE TOUCH ULTRA MINI) w/Device KIT 1 kit by Does not apply route as needed. 1 kit 0   Blood Pressure Monitoring (BLOOD PRESSURE MONITOR/L CUFF) MISC Use to monitor blood pressure as directed. 1 each 0   cetirizine (ZYRTEC) 10 MG tablet Take 10 mg by mouth daily.     diclofenac (VOLTAREN) 75 MG EC tablet Take 1 tablet (75 mg total) by mouth 2 (two) times daily. 60 tablet 3   glucose blood (ONETOUCH VERIO) test strip Use as instructed 100 each 4   hydrocortisone (ANUSOL-HC) 2.5 % rectal cream Place 1 Application rectally 2 (two) times daily. 30 g 0   Multiple Vitamins-Minerals (CENTRUM MULTIGUMMIES PO) Take by mouth. 50+     OneTouch Delica Lancets 33G MISC 1 each by Does not apply route daily. Use as instructed to check blood sugar once a day 100 each 4   triamcinolone cream (KENALOG) 0.1 % Apply 1 Application topically 2 (two) times daily. Apply to AA. 45 g 0   Turmeric 500 MG CAPS Take 1 tablet by mouth daily. With ginger     amLODipine (NORVASC) 10 MG tablet Take 1 tablet by mouth once daily 30 tablet 2   atorvastatin (LIPITOR) 40 MG tablet Take 1 tablet by mouth  once daily 30 tablet 2   azelastine (ASTELIN) 0.1 % nasal spray Place 2 sprays into both nostrils 2 (two) times daily. Use in each nostril as directed 30 mL 3   Cholecalciferol (VITAMIN D3) 2000 units TABS Take 1 tablet by mouth daily.     cyclobenzaprine (FLEXERIL) 10 MG tablet Take 0.5-1 tablets (5-10 mg total) by mouth 2 (two) times daily as needed for muscle spasms (sedation precautions). 30 tablet 0   lisinopril (ZESTRIL) 20 MG tablet Take 1 tablet by mouth twice daily 60 tablet 2   metFORMIN (GLUCOPHAGE) 500 MG tablet TAKE 1 TABLET BY MOUTH TWICE DAILY WITH A MEAL 60 tablet 2   MISC NATURAL PRODUCTS PO Take 500 mg by mouth daily. Turmeric + ginger     fluticasone (FLONASE)  50 MCG/ACT nasal spray Place 2 sprays into both nostrils daily. 16 g 3   loratadine (CLARITIN) 10 MG tablet Take 10 mg by mouth daily. Takes during winter     No facility-administered medications prior to visit.     Per HPI unless specifically indicated in ROS section below Review of Systems  Constitutional:  Positive for fever (feverish). Negative for activity change, appetite change, chills, fatigue and unexpected weight change.  HENT:  Negative for hearing loss.   Eyes:  Negative for visual disturbance.  Respiratory:  Negative for cough, chest tightness, shortness of breath and wheezing.   Cardiovascular:  Negative for chest pain and palpitations. Leg swelling: occ. Gastrointestinal:  Negative for abdominal distention, abdominal pain, blood in stool, constipation, diarrhea, nausea and vomiting.  Genitourinary:  Negative for difficulty urinating and hematuria.  Musculoskeletal:  Negative for arthralgias, myalgias and neck pain.  Skin:  Negative for rash.  Neurological:  Negative for dizziness, seizures, syncope and headaches.  Hematological:  Negative for adenopathy. Does not bruise/bleed easily.  Psychiatric/Behavioral:  Negative for dysphoric mood. The patient is not nervous/anxious.     Objective:  BP 134/88   Pulse 64   Temp 98.1 F (36.7 C) (Oral)   Ht 6\' 1"  (1.854 m)   Wt 246 lb 2 oz (111.6 kg)   SpO2 99%   BMI 32.47 kg/m   Wt Readings from Last 3 Encounters:  05/20/23 246 lb 2 oz (111.6 kg)  03/31/23 249 lb 6 oz (113.1 kg)  10/09/22 247 lb 4 oz (112.2 kg)      Physical Exam Vitals and nursing note reviewed.  Constitutional:      General: He is not in acute distress.    Appearance: Normal appearance. He is well-developed. He is not ill-appearing.  HENT:     Head: Normocephalic and atraumatic.     Right Ear: Hearing, tympanic membrane, ear canal and external ear normal.     Left Ear: Hearing, tympanic membrane, ear canal and external ear normal.     Mouth/Throat:      Mouth: Mucous membranes are moist.     Pharynx: Oropharynx is clear. No oropharyngeal exudate or posterior oropharyngeal erythema.  Eyes:     General: No scleral icterus.    Extraocular Movements: Extraocular movements intact.     Conjunctiva/sclera: Conjunctivae normal.     Pupils: Pupils are equal, round, and reactive to light.  Neck:     Thyroid: No thyroid mass or thyromegaly.  Cardiovascular:     Rate and Rhythm: Normal rate and regular rhythm.     Pulses: Normal pulses.          Radial pulses are 2+ on the right side and  2+ on the left side.     Heart sounds: Normal heart sounds. No murmur heard. Pulmonary:     Effort: Pulmonary effort is normal. No respiratory distress.     Breath sounds: Normal breath sounds. No wheezing, rhonchi or rales.  Abdominal:     General: Bowel sounds are normal. There is no distension.     Palpations: Abdomen is soft. There is no mass.     Tenderness: There is no abdominal tenderness. There is no guarding or rebound.     Hernia: No hernia is present.  Musculoskeletal:        General: Normal range of motion.     Cervical back: Normal range of motion and neck supple.     Right lower leg: No edema.     Left lower leg: No edema.  Lymphadenopathy:     Cervical: No cervical adenopathy.  Skin:    General: Skin is warm and dry.     Findings: No rash.  Neurological:     General: No focal deficit present.     Mental Status: He is alert and oriented to person, place, and time.  Psychiatric:        Mood and Affect: Mood normal.        Behavior: Behavior normal.        Thought Content: Thought content normal.        Judgment: Judgment normal.       Results for orders placed or performed in visit on 05/13/23  PSA   Collection Time: 05/13/23  7:55 AM  Result Value Ref Range   PSA 6.16 (H) 0.10 - 4.00 ng/mL  Vitamin B12   Collection Time: 05/13/23  7:55 AM  Result Value Ref Range   Vitamin B-12 407 211 - 911 pg/mL  CBC with  Differential/Platelet   Collection Time: 05/13/23  7:55 AM  Result Value Ref Range   WBC 8.7 4.0 - 10.5 K/uL   RBC 3.95 (L) 4.22 - 5.81 Mil/uL   Hemoglobin 12.7 (L) 13.0 - 17.0 g/dL   HCT 40.9 (L) 81.1 - 91.4 %   MCV 96.0 78.0 - 100.0 fl   MCHC 33.6 30.0 - 36.0 g/dL   RDW 78.2 95.6 - 21.3 %   Platelets 308.0 150.0 - 400.0 K/uL   Neutrophils Relative % 76.7 43.0 - 77.0 %   Lymphocytes Relative 8.5 (L) 12.0 - 46.0 %   Monocytes Relative 9.1 3.0 - 12.0 %   Eosinophils Relative 5.0 0.0 - 5.0 %   Basophils Relative 0.7 0.0 - 3.0 %   Neutro Abs 6.6 1.4 - 7.7 K/uL   Lymphs Abs 0.7 0.7 - 4.0 K/uL   Monocytes Absolute 0.8 0.1 - 1.0 K/uL   Eosinophils Absolute 0.4 0.0 - 0.7 K/uL   Basophils Absolute 0.1 0.0 - 0.1 K/uL  Parathyroid hormone, intact (no Ca)   Collection Time: 05/13/23  7:55 AM  Result Value Ref Range   PTH 58 16 - 77 pg/mL  Microalbumin / creatinine urine ratio   Collection Time: 05/13/23  7:55 AM  Result Value Ref Range   Microalb, Ur <0.7 0.0 - 1.9 mg/dL   Creatinine,U 08.6 mg/dL   Microalb Creat Ratio 0.9 0.0 - 30.0 mg/g  VITAMIN D 25 Hydroxy (Vit-D Deficiency, Fractures)   Collection Time: 05/13/23  7:55 AM  Result Value Ref Range   VITD 36.54 30.00 - 100.00 ng/mL  Phosphorus   Collection Time: 05/13/23  7:55 AM  Result Value Ref Range  Phosphorus 1.8 (L) 2.3 - 4.6 mg/dL  Comprehensive metabolic panel   Collection Time: 05/13/23  7:55 AM  Result Value Ref Range   Sodium 137 135 - 145 mEq/L   Potassium 4.8 3.5 - 5.1 mEq/L   Chloride 104 96 - 112 mEq/L   CO2 26 19 - 32 mEq/L   Glucose, Bld 138 (H) 70 - 99 mg/dL   BUN 27 (H) 6 - 23 mg/dL   Creatinine, Ser 5.78 (H) 0.40 - 1.50 mg/dL   Total Bilirubin 0.5 0.2 - 1.2 mg/dL   Alkaline Phosphatase 58 39 - 117 U/L   AST 18 0 - 37 U/L   ALT 26 0 - 53 U/L   Total Protein 6.8 6.0 - 8.3 g/dL   Albumin 4.0 3.5 - 5.2 g/dL   GFR 46.96 (L) >29.52 mL/min   Calcium 10.0 8.4 - 10.5 mg/dL  Lipid panel   Collection Time:  05/13/23  7:55 AM  Result Value Ref Range   Cholesterol 115 0 - 200 mg/dL   Triglycerides 841.3 (H) 0.0 - 149.0 mg/dL   HDL 24.40 (L) >10.27 mg/dL   VLDL 25.3 0.0 - 66.4 mg/dL   LDL Cholesterol 51 0 - 99 mg/dL   Total CHOL/HDL Ratio 3    NonHDL 82.39   Hemoglobin A1c   Collection Time: 05/13/23  7:55 AM  Result Value Ref Range   Hgb A1c MFr Bld 8.2 (H) 4.6 - 6.5 %    Assessment & Plan:   Problem List Items Addressed This Visit     Health maintenance examination - Primary (Chronic)   Preventative protocols reviewed and updated unless pt declined. Discussed healthy diet and lifestyle.       Type 2 diabetes mellitus with other specified complication (HCC)   Relevant Medications   atorvastatin (LIPITOR) 40 MG tablet   lisinopril (ZESTRIL) 20 MG tablet   metFORMIN (GLUCOPHAGE) 500 MG tablet   Hyperlipidemia associated with type 2 diabetes mellitus (HCC)   Relevant Medications   amLODipine (NORVASC) 10 MG tablet   atorvastatin (LIPITOR) 40 MG tablet   lisinopril (ZESTRIL) 20 MG tablet   metFORMIN (GLUCOPHAGE) 500 MG tablet   Hypertension   Relevant Medications   amLODipine (NORVASC) 10 MG tablet   atorvastatin (LIPITOR) 40 MG tablet   lisinopril (ZESTRIL) 20 MG tablet   Allergic rhinitis   Relevant Medications   azelastine (ASTELIN) 0.1 % nasal spray   CKD stage 3 due to type 2 diabetes mellitus (HCC)   Relevant Medications   atorvastatin (LIPITOR) 40 MG tablet   lisinopril (ZESTRIL) 20 MG tablet   metFORMIN (GLUCOPHAGE) 500 MG tablet   Other Relevant Orders   Serum protein electrophoresis with reflex   Other Visit Diagnoses       Special screening for malignant neoplasms, colon       Relevant Orders   Ambulatory referral to Gastroenterology     Elevated PSA       Relevant Orders   PSA, total and free     Hypophosphatemia       Relevant Orders   Phosphorus     Renal insufficiency       Relevant Orders   Serum protein electrophoresis with reflex         Meds ordered this encounter  Medications   amLODipine (NORVASC) 10 MG tablet    Sig: Take 1 tablet (10 mg total) by mouth daily.    Dispense:  90 tablet    Refill:  4  atorvastatin (LIPITOR) 40 MG tablet    Sig: Take 1 tablet (40 mg total) by mouth daily.    Dispense:  90 tablet    Refill:  4   azelastine (ASTELIN) 0.1 % nasal spray    Sig: Place 2 sprays into both nostrils 2 (two) times daily. Use in each nostril as directed    Dispense:  30 mL    Refill:  3   lisinopril (ZESTRIL) 20 MG tablet    Sig: Take 1 tablet (20 mg total) by mouth 2 (two) times daily.    Dispense:  180 tablet    Refill:  4   metFORMIN (GLUCOPHAGE) 500 MG tablet    Sig: Take 1 tablet (500 mg total) by mouth 2 (two) times daily with a meal.    Dispense:  180 tablet    Refill:  4    Orders Placed This Encounter  Procedures   PSA, total and free   Serum protein electrophoresis with reflex   Phosphorus   Ambulatory referral to Gastroenterology    Referral Priority:   Routine    Referral Type:   Consultation    Referral Reason:   Specialty Services Required    Number of Visits Requested:   1    Patient Instructions  We will refer you for colonoscopy - you may call  GI to schedule an appointment at [phone].   Increase water intake, decrease added sugar, sweetened beverages and simple carbs in the diet. Let me know if interested in return to nutritionist for 2 hours refresher course - check on coverage.  Recheck PSA - if staying elevated, we will refer you to urology.  Good to see you today  Return in 6 months for diabetes follow up visit   Follow up plan: Return in about 6 months (around 11/17/2023) for follow up visit.  Eustaquio Boyden, MD

## 2023-05-21 DIAGNOSIS — R972 Elevated prostate specific antigen [PSA]: Secondary | ICD-10-CM | POA: Insufficient documentation

## 2023-05-21 LAB — SEDIMENTATION RATE: Sed Rate: 29 mm/h — ABNORMAL HIGH (ref 0–20)

## 2023-05-21 LAB — PHOSPHORUS: Phosphorus: 3.3 mg/dL (ref 2.3–4.6)

## 2023-05-21 NOTE — Assessment & Plan Note (Signed)
Chronic, stable on current regimen - continue this.

## 2023-05-21 NOTE — Assessment & Plan Note (Signed)
Chronic, stable on atorvastatin 40mg  daily - continue. The ASCVD Risk score (Arnett DK, et al., 2019) failed to calculate for the following reasons:   The valid total cholesterol range is 130 to 320 mg/dL

## 2023-05-21 NOTE — Assessment & Plan Note (Signed)
PTH normalized off vit D supplementation - will continue to monitor.

## 2023-05-21 NOTE — Assessment & Plan Note (Signed)
Continue to encourage healthy diet and lifestyle choices to affect sustainable weight loss.

## 2023-05-21 NOTE — Assessment & Plan Note (Signed)
He stopped vitamin D with normalization of PTH. Levels down to 30s. Will stay off at this time.

## 2023-05-21 NOTE — Assessment & Plan Note (Signed)
Chronic, reviewed latest kidney function GFR 50 anticipate due to diabetes, hypertension.  Check SPEP.

## 2023-05-21 NOTE — Assessment & Plan Note (Addendum)
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  Continue topical voltaren, trial colchicine PRN, monitoring kidney function.  Consider return to sports med if no improvement with above

## 2023-05-21 NOTE — Assessment & Plan Note (Signed)
Continue astelin PRN

## 2023-05-21 NOTE — Assessment & Plan Note (Addendum)
Chronic, deteriorated based on latest A1c - will renew efforts to follow diabetic diet.  Offered return to nutritionist for refresher course with wife - he will consider this.  RTC 3 mo DM f/u visit. Discussed additional medication if persistently elevated next visit.

## 2023-05-21 NOTE — Assessment & Plan Note (Signed)
Pt asxs. Benign DRE.  Fmhx prostate cancer (father) Update PSA, free and total, and if persistently elevated will refer to urology. He agrees with plan.

## 2023-05-21 NOTE — Assessment & Plan Note (Signed)
Incidentally noted. Reviewed phosphate intake in diet. Update levels on repeat labs today

## 2023-05-21 NOTE — Assessment & Plan Note (Signed)
Mild chronic anemia thought related to CKD.  Still pending colonoscopy - see above.

## 2023-05-23 LAB — PROTEIN ELECTROPHORESIS, SERUM, WITH REFLEX
Albumin ELP: 4.1 g/dL (ref 3.8–4.8)
Alpha 1: 0.3 g/dL (ref 0.2–0.3)
Alpha 2: 0.8 g/dL (ref 0.5–0.9)
Beta 2: 0.4 g/dL (ref 0.2–0.5)
Beta Globulin: 0.5 g/dL (ref 0.4–0.6)
Gamma Globulin: 1.3 g/dL (ref 0.8–1.7)
Total Protein: 7.3 g/dL (ref 6.1–8.1)

## 2023-05-23 LAB — PSA, TOTAL AND FREE
PSA, % Free: 28 % (ref >25)
PSA, Free: 0.8 ng/mL
PSA, Total: 2.9 ng/mL (ref <=4.0)

## 2023-05-29 ENCOUNTER — Encounter: Payer: Self-pay | Admitting: Family Medicine

## 2023-05-30 ENCOUNTER — Telehealth: Payer: Self-pay | Admitting: Family Medicine

## 2023-05-30 DIAGNOSIS — E1122 Type 2 diabetes mellitus with diabetic chronic kidney disease: Secondary | ICD-10-CM

## 2023-05-30 DIAGNOSIS — E213 Hyperparathyroidism, unspecified: Secondary | ICD-10-CM

## 2023-05-30 NOTE — Telephone Encounter (Signed)
Spoke with patient re: primary vs secondary hyperparathyroidism.   He denies h/o kidney stones.  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   Reviewing chart, elevated PTH may have preceded deteriorated kidney function.   Will further evaluate for primary hyperparathyroidism with renal US and DEXA scan.

## 2023-05-30 NOTE — Telephone Encounter (Addendum)
Please given # for pt to schedule bone density scan at [facility].  I'd also like him to come in at his convenience to collect a urine sample for UA - ordered.

## 2023-05-30 NOTE — Addendum Note (Signed)
Addended by: Eustaquio Boyden on: 05/30/2023 05:24 PM   Modules accepted: Orders

## 2023-06-02 NOTE — Telephone Encounter (Signed)
 Spoke with pt providing phn # [phone] for [facility] for BMD. Also, I relayed Dr Timoteo Expose message and pt schedule lab visit on 06/05/23 at 7:45.

## 2023-06-05 ENCOUNTER — Other Ambulatory Visit (INDEPENDENT_AMBULATORY_CARE_PROVIDER_SITE_OTHER): Payer: No Typology Code available for payment source

## 2023-06-05 DIAGNOSIS — E1122 Type 2 diabetes mellitus with diabetic chronic kidney disease: Secondary | ICD-10-CM | POA: Diagnosis not present

## 2023-06-05 DIAGNOSIS — N183 Chronic kidney disease, stage 3 unspecified: Secondary | ICD-10-CM | POA: Diagnosis not present

## 2023-06-05 LAB — URINALYSIS, ROUTINE W REFLEX MICROSCOPIC
Bilirubin Urine: NEGATIVE
Hgb urine dipstick: NEGATIVE
Ketones, ur: NEGATIVE
Leukocytes,Ua: NEGATIVE
Nitrite: NEGATIVE
RBC / HPF: NONE SEEN (ref 0–?)
Specific Gravity, Urine: 1.015 (ref 1.000–1.030)
Total Protein, Urine: NEGATIVE
Urine Glucose: NEGATIVE
Urobilinogen, UA: 0.2 (ref 0.0–1.0)
pH: 6 (ref 5.0–8.0)

## 2023-06-10 ENCOUNTER — Ambulatory Visit
Admission: RE | Admit: 2023-06-10 | Discharge: 2023-06-10 | Disposition: A | Discharge status: Home / Self Care | Payer: No Typology Code available for payment source | Source: Ambulatory Visit | Attending: Family Medicine | Admitting: Family Medicine

## 2023-06-10 DIAGNOSIS — N183 Chronic kidney disease, stage 3 unspecified: Secondary | ICD-10-CM

## 2023-06-10 DIAGNOSIS — E1122 Type 2 diabetes mellitus with diabetic chronic kidney disease: Secondary | ICD-10-CM

## 2023-06-10 DIAGNOSIS — E213 Hyperparathyroidism, unspecified: Secondary | ICD-10-CM

## 2023-06-16 ENCOUNTER — Encounter: Payer: Self-pay | Admitting: Family Medicine

## 2023-06-30 ENCOUNTER — Encounter: Payer: Self-pay | Admitting: Gastroenterology

## 2023-07-07 ENCOUNTER — Ambulatory Visit
Admission: RE | Admit: 2023-07-07 | Discharge: 2023-07-07 | Disposition: A | Discharge status: Home / Self Care | Source: Ambulatory Visit | Attending: Family Medicine | Admitting: Family Medicine

## 2023-07-07 DIAGNOSIS — E213 Hyperparathyroidism, unspecified: Secondary | ICD-10-CM | POA: Insufficient documentation

## 2023-07-14 ENCOUNTER — Encounter: Payer: Self-pay | Admitting: Family Medicine

## 2023-07-14 DIAGNOSIS — M8580 Other specified disorders of bone density and structure, unspecified site: Secondary | ICD-10-CM | POA: Insufficient documentation

## 2023-08-18 ENCOUNTER — Encounter (HOSPITAL_COMMUNITY): Payer: Self-pay

## 2023-08-25 ENCOUNTER — Ambulatory Visit (AMBULATORY_SURGERY_CENTER): Admitting: *Deleted

## 2023-08-25 ENCOUNTER — Encounter: Payer: Self-pay | Admitting: Gastroenterology

## 2023-08-25 VITALS — Ht 73.0 in | Wt 255.0 lb

## 2023-08-25 DIAGNOSIS — Z1211 Encounter for screening for malignant neoplasm of colon: Secondary | ICD-10-CM

## 2023-08-25 MED ORDER — NA SULFATE-K SULFATE-MG SULF 17.5-3.13-1.6 GM/177ML PO SOLN: 1.0000 | Freq: Once | ORAL | 0 refills | Status: AC

## 2023-08-25 NOTE — Progress Notes (Signed)
 Pt's name and DOB verified at the beginning of the pre-visit wit 2 identifiers  Pt denies any difficulty with ambulating,sitting, laying down or rolling side to side  Pt has no issues moving head neck or swallowing  No egg or soy allergy known to patient   No issues known to pt with past sedation with any surgeries or procedures  Patient denies ever being intubated  No FH of Malignant Hyperthermia  Pt is not on home 02   Pt is not on blood thinners   Pt denies issues with constipation   Pt is not on dialysis  Pt denise any abnormal heart rhythms   Pt denies any upcoming cardiac testing  Patient's chart reviewed by Rogena Class CNRA prior to pre-visit and patient appropriate for the [facility].  Pre-visit completed and red dot placed by patient's name on their procedure day (on provider's schedule).    Visit by phone  Pt states weight is 255 lb  IInstructions reviewed. Pt given  both [facility] main # and MD on call # prior to instructions.  Pt states understanding of instructions. Instructed pt to review instructions again prior to procedure and call main # given if has questions.. Pt states they will.   Instructed pt on where to find instructions on My Chart.

## 2023-09-07 HISTORY — PX: COLONOSCOPY: SHX174

## 2023-09-12 ENCOUNTER — Encounter: Payer: Self-pay | Admitting: Gastroenterology

## 2023-09-12 ENCOUNTER — Ambulatory Visit: Admitting: Gastroenterology

## 2023-09-12 VITALS — BP 103/73 | HR 53 | Temp 97.3°F | Resp 15 | Ht 73.0 in | Wt 255.0 lb

## 2023-09-12 DIAGNOSIS — K635 Polyp of colon: Secondary | ICD-10-CM

## 2023-09-12 DIAGNOSIS — D120 Benign neoplasm of cecum: Secondary | ICD-10-CM | POA: Diagnosis not present

## 2023-09-12 DIAGNOSIS — Z1211 Encounter for screening for malignant neoplasm of colon: Secondary | ICD-10-CM

## 2023-09-12 DIAGNOSIS — D124 Benign neoplasm of descending colon: Secondary | ICD-10-CM

## 2023-09-12 DIAGNOSIS — D125 Benign neoplasm of sigmoid colon: Secondary | ICD-10-CM | POA: Diagnosis not present

## 2023-09-12 DIAGNOSIS — D122 Benign neoplasm of ascending colon: Secondary | ICD-10-CM

## 2023-09-12 DIAGNOSIS — D121 Benign neoplasm of appendix: Secondary | ICD-10-CM

## 2023-09-12 DIAGNOSIS — D128 Benign neoplasm of rectum: Secondary | ICD-10-CM

## 2023-09-12 DIAGNOSIS — K641 Second degree hemorrhoids: Secondary | ICD-10-CM | POA: Diagnosis not present

## 2023-09-12 DIAGNOSIS — K5730 Diverticulosis of large intestine without perforation or abscess without bleeding: Secondary | ICD-10-CM

## 2023-09-12 DIAGNOSIS — D123 Benign neoplasm of transverse colon: Secondary | ICD-10-CM | POA: Diagnosis not present

## 2023-09-12 MED ORDER — SODIUM CHLORIDE 0.9 % IV SOLN: 500.0000 mL | Freq: Once | INTRAVENOUS | Status: DC

## 2023-09-12 NOTE — Patient Instructions (Addendum)
-  Handout on polyps, high fiber diet, diverticulosis and hemorrhoids provided. -await pathology results. -repeat colonoscopy for surveillance recommended in 1 year -Continue present medications. -Use FiberCon 1-2 tablets daily  YOU HAD AN ENDOSCOPIC PROCEDURE TODAY AT THE [facility]:   Refer to the procedure report that was given to you for any specific questions about what was found during the examination.  If the procedure report does not answer your questions, please call your gastroenterologist to clarify.  If you requested that your care partner not be given the details of your procedure findings, then the procedure report has been included in a sealed envelope for you to review at your convenience later.  YOU SHOULD EXPECT: Some feelings of bloating in the abdomen. Passage of more gas than usual.  Walking can help get rid of the air that was put into your GI tract during the procedure and reduce the bloating. If you had a lower endoscopy (such as a colonoscopy or flexible sigmoidoscopy) you may notice spotting of blood in your stool or on the toilet paper. If you underwent a bowel prep for your procedure, you may not have a normal bowel movement for a few days.  Please Note:  You might notice some irritation and congestion in your nose or some drainage.  This is from the oxygen used during your procedure.  There is no need for concern and it should clear up in a day or so.  SYMPTOMS TO REPORT IMMEDIATELY:  Following lower endoscopy (colonoscopy or flexible sigmoidoscopy):  Excessive amounts of blood in the stool  Significant tenderness or worsening of abdominal pains  Swelling of the abdomen that is new, acute  Fever of 100F or higher  For urgent or emergent issues, a gastroenterologist can be reached at any hour by calling [phone]. Do not use MyChart messaging for urgent concerns.    DIET:  We do recommend a small meal at first, but then you may proceed to your  regular diet.  Drink plenty of fluids but you should avoid alcoholic beverages for 24 hours.  ACTIVITY:  You should plan to take it easy for the rest of today and you should NOT DRIVE or use heavy machinery until tomorrow (because of the sedation medicines used during the test).    FOLLOW UP: Our staff will call the number listed on your records the next business day following your procedure.  We will call around 7:15- 8:00 am to check on you and address any questions or concerns that you may have regarding the information given to you following your procedure. If we do not reach you, we will leave a message.     If any biopsies were taken you will be contacted by phone or by letter within the next 1-3 weeks.  Please call us  at [phone] if you have not heard about the biopsies in 3 weeks.    SIGNATURES/CONFIDENTIALITY: You and/or your care partner have signed paperwork which will be entered into your electronic medical record.  These signatures attest to the fact that that the information above on your After Visit Summary has been reviewed and is understood.  Full responsibility of the confidentiality of this discharge information lies with you and/or your care-partner.

## 2023-09-12 NOTE — Progress Notes (Signed)
 GASTROENTEROLOGY PROCEDURE H&P NOTE   Primary Care Physician: Claire Crick, MD  HPI: Joseph Wint. is a 58 y.o. male who presents for Colonoscopy for screening.  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 90 tablet 4   Ascorbic Acid (VITAMIN C ) 1000 MG tablet Take 1,000 mg by mouth daily.     atorvastatin  (LIPITOR) 40 MG tablet Take 1 tablet (40 mg total) by mouth daily. 90 tablet 4   azelastine  (ASTELIN ) 0.1 % nasal spray Place 2 sprays into both nostrils 2 (two) times daily. Use in each nostril as directed 30 mL 3   blood glucose meter kit and supplies KIT Dispense based on patient and insurance preference. Use to check sugars once daily. E11.8 1 each 3   Blood Glucose Monitoring Suppl (ONE TOUCH ULTRA MINI) w/Device KIT 1 kit by Does not apply route as needed. 1 kit 0   Blood Pressure Monitoring (BLOOD PRESSURE MONITOR/L CUFF) MISC Use to monitor blood pressure as directed. 1 each 0   cetirizine (ZYRTEC) 10 MG tablet Take 10 mg by mouth daily.     diclofenac  (VOLTAREN ) 75 MG EC tablet Take 1 tablet (75 mg total) by mouth 2 (two) times daily. (Patient taking differently: Take 75 mg by mouth 2 (two) times daily. As needed) 60 tablet 3   glucose blood (ONETOUCH  VERIO) test strip Use as instructed 100 each 4   hydrocortisone  (ANUSOL -HC) 2.5 % rectal cream Place 1 Application rectally 2 (two) times daily. (Patient not taking: Reported on 08/25/2023) 30 g 0   lisinopril  (ZESTRIL ) 20 MG tablet Take 1 tablet (20 mg total) by mouth 2 (two) times daily. 180 tablet 4   metFORMIN  (GLUCOPHAGE ) 500 MG tablet Take 1 tablet (500 mg total) by mouth 2 (two) times daily with a meal. 180 tablet 4   Multiple Vitamins-Minerals (CENTRUM MULTIGUMMIES PO) Take by mouth. 50+     OneTouch Delica Lancets 33G MISC 1 each by Does not apply route daily. Use as instructed to check blood sugar once a day 100 each 4   triamcinolone  cream (KENALOG ) 0.1 % Apply 1 Application topically 2 (two) times daily. Apply to AA. (Patient not taking: Reported on 08/25/2023) 45 g 0   Turmeric 500 MG CAPS Take 1 tablet by mouth daily. With ginger     Current Facility-Administered Medications  Medication Dose Route Frequency Provider Last Rate Last Admin   0.9 %  sodium chloride infusion  500 mL Intravenous Once Mansouraty, Alzora Ha Jr., MD        Current Outpatient Medications:    Na Sulfate-K Sulfate-Mg Sulfate concentrate (SUPREP) 17.5-3.13-1.6 GM/177ML SOLN, SMARTSIG:1 Kit(s) By Mouth Once, Disp: , Rfl:  amLODipine  (NORVASC ) 10 MG tablet, Take 1 tablet (10 mg total) by mouth daily., Disp: 90 tablet, Rfl: 4   Ascorbic Acid (VITAMIN C ) 1000 MG tablet, Take 1,000 mg by mouth daily., Disp: , Rfl:    atorvastatin  (LIPITOR) 40 MG tablet, Take 1 tablet (40 mg total) by mouth daily., Disp: 90 tablet, Rfl: 4   azelastine  (ASTELIN ) 0.1 % nasal spray, Place 2 sprays into both nostrils 2 (two) times daily. Use in each nostril as directed, Disp: 30 mL, Rfl: 3   blood glucose meter kit and supplies KIT, Dispense based on patient and insurance preference. Use to check sugars once daily. E11.8, Disp: 1 each, Rfl: 3   Blood Glucose Monitoring Suppl (ONE TOUCH ULTRA MINI) w/Device KIT, 1 kit by Does not apply  route as needed., Disp: 1 kit, Rfl: 0   Blood Pressure Monitoring (BLOOD PRESSURE MONITOR/L CUFF) MISC, Use to monitor blood pressure as directed., Disp: 1 each, Rfl: 0   cetirizine (ZYRTEC) 10 MG tablet, Take 10 mg by mouth daily., Disp: , Rfl:    diclofenac  (VOLTAREN ) 75 MG EC tablet, Take 1 tablet (75 mg total) by mouth 2 (two) times daily. (Patient taking differently: Take 75 mg by mouth 2 (two) times daily. As needed), Disp: 60 tablet, Rfl: 3   glucose blood (ONETOUCH VERIO) test strip, Use as instructed, Disp: 100 each, Rfl: 4   hydrocortisone  (ANUSOL -HC) 2.5 % rectal cream, Place 1 Application rectally 2 (two) times daily. (Patient not taking: Reported on 08/25/2023), Disp: 30 g, Rfl: 0   lisinopril  (ZESTRIL ) 20 MG tablet, Take 1 tablet (20 mg total) by mouth 2 (two) times daily., Disp: 180 tablet, Rfl: 4   metFORMIN  (GLUCOPHAGE ) 500 MG tablet, Take 1 tablet (500 mg total) by mouth 2 (two) times daily with a meal., Disp: 180 tablet, Rfl: 4   Multiple Vitamins-Minerals (CENTRUM MULTIGUMMIES PO), Take by mouth. 50+, Disp: , Rfl:    OneTouch Delica Lancets 33G MISC, 1 each by Does not apply route daily. Use as instructed to check blood sugar once a day, Disp: 100 each, Rfl: 4   triamcinolone  cream (KENALOG ) 0.1 %, Apply 1 Application topically 2 (two) times daily. Apply to AA. (Patient not taking: Reported on 08/25/2023), Disp: 45 g, Rfl: 0   Turmeric 500 MG CAPS, Take 1 tablet by mouth daily. With ginger, Disp: , Rfl:   Current Facility-Administered Medications:    0.9 %  sodium chloride infusion, 500 mL, Intravenous, Once, Mansouraty, Albino Alu., MD Allergies  Allergen Reactions   Hctz [Hydrochlorothiazide ] Other (See Comments)    hypercalcemia   Family History  Problem Relation Age of Onset   Healthy Mother    Pancreatic cancer Father    Cancer Father 59       prostate, bone   Diabetes Sister    Diabetes Maternal Aunt    Cancer Paternal Uncle        prostate   Kidney disease  Paternal Uncle        ESRD   Deep vein thrombosis Paternal Uncle    Asthma Son    Hypertension Other        family history   Stroke Neg Hx    CAD Neg Hx    Colon cancer Neg Hx    Colon polyps Neg Hx    Esophageal cancer Neg Hx    Stomach cancer Neg Hx    Rectal cancer Neg Hx    Social History   Socioeconomic History   Marital  status: Married    Spouse name: Not on file   Number of children: 3   Years of education: Not on file   Highest education level: Associate degree: academic program  Occupational History   Occupation: Self-employed  Tobacco Use   Smoking status: Former    Types: Cigars   Smokeless tobacco: Never   Tobacco comments:    Very occasional cigar/had not smoked one in 10 years  Substance and Sexual Activity   Alcohol use: No    Alcohol/week: 0.0 standard drinks of alcohol   Drug use: No   Sexual activity: Not on file  Other Topics Concern   Not on file  Social History Narrative   "[employer] [place]"   Caffeine: rare.   Lives with wife and youngest son, 2 older children out of the home   Occupation: Self employed, Youth worker.   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   Diet: watches carbs and starches, lots of water throughout the day, some fruits/vegetables.   Social Drivers of Health   Financial Resource Strain: Patient Declined (05/18/2023)   Overall Financial Resource Strain (CARDIA)    Difficulty of Paying Living Expenses: Patient declined  Food Insecurity: Patient Declined (05/18/2023)   Hunger Vital Sign    Worried About Running Out of Food in the Last Year: Patient declined    Ran Out of Food in the Last Year: Patient declined  Transportation Needs: Patient Declined (05/18/2023)   PRAPARE - Administrator, Civil Service (Medical): Patient declined    Lack of Transportation (Non-Medical): Patient declined  Physical Activity: Unknown (05/18/2023)   Exercise Vital Sign    Days of Exercise per Week: Patient declined     Minutes of Exercise per Session: 10 min  Stress: No Stress Concern Present (05/18/2023)   [employer] of Occupational Health - Occupational Stress Questionnaire    Feeling of Stress : Not at all  Social Connections: Unknown (05/18/2023)   Social Connection and Isolation Panel [NHANES]    Frequency of Communication with Friends and Family: More than three times a week    Frequency of Social Gatherings with Friends and Family: Once a week    Attends Religious Services: More than 4 times per year    Active Member of Clubs or Organizations: Patient declined    Attends Banker Meetings: Not on file    Marital Status: Married  Intimate Partner Violence: Not on file    Physical Exam: Today's Vitals   09/12/23 1326  BP: (!) 145/93  Pulse: 64  Temp: (!) 97.3 F (36.3 C)  TempSrc: Skin  SpO2: 96%  Weight: 255 lb (115.7 kg)  Height: 6\' 1"  (1.854 m)   Body mass index is 33.64 kg/m. GEN: NAD EYE: Sclerae anicteric ENT: MMM CV: Non-tachycardic GI: Soft, NT/ND NEURO:  Alert & Oriented x 3  Lab Results: No results for input(s): "WBC", "HGB", "HCT", "PLT" in the last 72 hours. BMET No results for input(s): "NA", "K", "CL", "CO2", "GLUCOSE", "BUN", "CREATININE", "CALCIUM " in the last 72 hours. LFT No results for input(s): "PROT", "ALBUMIN", "AST", "ALT", "ALKPHOS", "BILITOT", "BILIDIR", "IBILI" in the last 72 hours. PT/INR No results for input(s): "LABPROT", "INR" in the last 72 hours.   Impression / Plan: This is a 58 y.o.male who presents for Colonoscopy for screening.  The risks and benefits of endoscopic evaluation/treatment were discussed with the patient and/or family; these include but are not limited to the risk of perforation,  infection, bleeding, missed lesions, lack of diagnosis, severe illness requiring hospitalization, as well as anesthesia and sedation related illnesses.  The patient's history has been reviewed, patient examined, no change in status, and  deemed stable for procedure.  The patient and/or family is agreeable to proceed.    Joseph Henle, MD [facility] Office # [phone]

## 2023-09-12 NOTE — Progress Notes (Signed)
 Report to PACU, RN, vss, BBS= Clear.

## 2023-09-12 NOTE — Progress Notes (Signed)
 Pt's states no medical or surgical changes since previsit or office visit.

## 2023-09-12 NOTE — Op Note (Signed)
 [facility] Patient Name: Joseph Galloway Procedure Date: 09/12/2023 2:07 PM MRN: [id number] Endoscopist: Yong Henle , MD, [id number] Age: 58 Referring MD:  Date of Birth: 1965-06-25 Gender: Male Account #: [id number] Procedure:                Colonoscopy Indications:              Screening for colorectal malignant neoplasm, This                            is the patient's first colonoscopy Medicines:                Monitored Anesthesia Care Procedure:                Pre-Anesthesia Assessment:                           - Prior to the procedure, a History and Physical                            was performed, and patient medications and                            allergies were reviewed. The patient's tolerance of                            previous anesthesia was also reviewed. The risks                            and benefits of the procedure and the sedation                            options and risks were discussed with the patient.                            All questions were answered, and informed consent                            was obtained. Prior Anticoagulants: The patient has                            taken no anticoagulant or antiplatelet agents. ASA                            Grade Assessment: II - A patient with mild systemic                            disease. After reviewing the risks and benefits,                            the patient was deemed in satisfactory condition to                            undergo the procedure.  After obtaining informed consent, the colonoscope                            was passed under direct vision. Throughout the                            procedure, the patient's blood pressure, pulse, and                            oxygen saturations were monitored continuously. The                            CF [id number] #[id number] was introduced through the anus                            and advanced to the  5 cm into the ileum. The                            colonoscopy was performed without difficulty. The                            patient tolerated the procedure. The quality of the                            bowel preparation was good. The terminal ileum,                            ileocecal valve, appendiceal orifice, and rectum                            were photographed. Scope In: 2:12:20 PM Scope Out: 2:31:58 PM Scope Withdrawal Time: 0 hours 17 minutes 5 seconds  Total Procedure Duration: 0 hours 19 minutes 38 seconds  Findings:                 The digital rectal exam findings include                            hemorrhoids. Pertinent negatives include no                            palpable rectal lesions.                           The terminal ileum and ileocecal valve appeared                            normal.                           13 sessile and semi-sessile polyps were found in                            the rectum (3), sigmoid colon (1), descending colon                            (  2), transverse colon (4), ascending colon (2) and                            appendiceal orifice (1). The polyps were 2 to 12 mm                            in size. These polyps were removed with a cold                            snare. Resection and retrieval were complete.                           A few small-mouthed diverticula were found in the                            right colon.                           Normal mucosa was found in the entire colon                            otherwise.                           Non-bleeding non-thrombosed internal hemorrhoids                            were found during retroflexion, during perianal                            exam and during digital exam. The hemorrhoids were                            Grade II (internal hemorrhoids that prolapse but                            reduce spontaneously). Complications:            No immediate  complications. Estimated Blood Loss:     Estimated blood loss was minimal. Impression:               - Hemorrhoids found on digital rectal exam.                           - The examined portion of the ileum was normal.                           - 13, 2 to 12 mm polyps in the rectum, in the                            sigmoid colon, in the descending colon, in the                            transverse colon, in the ascending colon and at the  appendiceal orifice, removed with a cold snare.                            Resected and retrieved.                           - Diverticulosis in the right colon.                           - Normal mucosa in the entire examined colon                            otherwise.                           - Non-bleeding non-thrombosed internal hemorrhoids. Recommendation:           - The patient will be observed post-procedure,                            until all discharge criteria are met.                           - Discharge patient to home.                           - Patient has a contact number available for                            emergencies. The signs and symptoms of potential                            delayed complications were discussed with the                            patient. Return to normal activities tomorrow.                            Written discharge instructions were provided to the                            patient.                           - High fiber diet.                           - Use FiberCon 1-2 tablets PO daily.                           - Continue present medications.                           - Await pathology results.                           - Repeat colonoscopy in 1 year for surveillance,  due to the number of polyps found on today's                            examination.                           - The findings and recommendations were discussed                             with the patient.                           - The findings and recommendations were discussed                            with the patient's family. Yong Henle, MD 09/12/2023 2:38:17 PM

## 2023-09-12 NOTE — Progress Notes (Signed)
 Called to room to assist during endoscopic procedure.  Patient ID and intended procedure confirmed with present staff. Received instructions for my participation in the procedure from the performing physician.

## 2023-09-15 ENCOUNTER — Telehealth: Payer: Self-pay

## 2023-09-15 NOTE — Telephone Encounter (Signed)
 Left message

## 2023-09-17 ENCOUNTER — Ambulatory Visit: Payer: Self-pay | Admitting: Gastroenterology

## 2023-09-17 ENCOUNTER — Ambulatory Visit (INDEPENDENT_AMBULATORY_CARE_PROVIDER_SITE_OTHER): Payer: No Typology Code available for payment source | Admitting: Family Medicine

## 2023-09-17 ENCOUNTER — Encounter: Payer: Self-pay | Admitting: Family Medicine

## 2023-09-17 VITALS — BP 128/80 | HR 57 | Temp 98.5°F | Ht 73.0 in | Wt 246.1 lb

## 2023-09-17 DIAGNOSIS — E1169 Type 2 diabetes mellitus with other specified complication: Secondary | ICD-10-CM | POA: Diagnosis not present

## 2023-09-17 DIAGNOSIS — E213 Hyperparathyroidism, unspecified: Secondary | ICD-10-CM

## 2023-09-17 DIAGNOSIS — M85851 Other specified disorders of bone density and structure, right thigh: Secondary | ICD-10-CM | POA: Diagnosis not present

## 2023-09-17 LAB — SURGICAL PATHOLOGY

## 2023-09-17 LAB — POCT GLYCOSYLATED HEMOGLOBIN (HGB A1C): Hemoglobin A1C: 7.2 % — AB (ref 4.0–5.6)

## 2023-09-17 NOTE — Progress Notes (Signed)
 Ph: [phone] Fax: [phone]   Patient ID: Joseph Ebbs., male    DOB: [birth year], 58 y.o.   MRN: [id number]  This visit was conducted in person.  BP 128/80 (BP Location: Right Arm, Cuff Size: Large)   Pulse (!) 57   Temp 98.5 F (36.9 C) (Oral)   Ht 6' 1 (1.854 m)   Wt 246 lb 2 oz (111.6 kg)   SpO2 97%   BMI 32.47 kg/m    CC: 4 mo DM f/u visit  Subjective:   HPI: Joseph Chaput. is a 58 y.o. male presenting on 09/17/2023 for Medical Management of Chronic Issues (Here for 4 mo DM f/u. )   Recent colonoscopy - 13 polyps removed, pathology pending  Weight loss noted - planning to start going to [facility] [employer].   DM - does not regularly check sugars. Compliant with antihyperglycemic regimen which includes: metformin  500mg  bid. Denies low sugars or hypoglycemic symptoms. Denies paresthesias, blurry vision. Last diabetic eye exam DUE - records requested from [facility] 2024. Glucometer brand: one touch ultra mini. Last foot exam: 10/2022 - DUE. DSME: completed remotely at [facility].  Lab Results  Component Value Date   HGBA1C 7.2 (A) 09/17/2023   Diabetic Foot Exam - Simple   Simple Foot Form Diabetic Foot exam was performed with the following findings: Yes 09/17/2023  8:28 AM  Visual Inspection No deformities, no ulcerations, no other skin breakdown bilaterally: Yes Sensation Testing Intact to touch and monofilament testing bilaterally: Yes Pulse Check Posterior Tibialis and Dorsalis pulse intact bilaterally: Yes Comments No claudication Chronic flat foot bilaterally    Lab Results  Component Value Date   MICROALBUR <0.7 05/13/2023     CKD -  Lab Results  Component Value Date   NA 137 05/13/2023   CL 104 05/13/2023   K 4.8 05/13/2023   CO2 26 05/13/2023   BUN 27 (H) 05/13/2023   CREATININE 1.55 (H) 05/13/2023   GFR 50.16 (L) 05/13/2023   CALCIUM  10.0 05/13/2023   PHOS 3.3 05/20/2023   ALBUMIN 4.0 05/13/2023   GLUCOSE  138 (H) 05/13/2023    Lab Results  Component Value Date   PTH 58 05/13/2023   CALCIUM  10.0 05/13/2023   CAION 5.5 12/07/2019   PHOS 3.3 05/20/2023   Lab Results  Component Value Date   VD25OH 36.54 05/13/2023   Lab Results  Component Value Date   TSH 1.08 03/04/2018    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  Reviewing chart, elevated PTH may have preceded deteriorated kidney function.   DEXA 06/2023: T -1.3 RTF, not at increased fracture risk.   06/2023 Renal US  IMPRESSION: Normal renal ultrasound.  No evidence of lithiasis or hydronephrosis  10/2022 R knee xray IMPRESSION: 1. Right knee tricompartmental osteoarthritis, most pronounced in the patellofemoral and lateral compartment. 2. Small right knee joint effusion. 3. Bilateral knee chondrocalcinosis.     Relevant past medical, surgical, family and social history reviewed and updated as indicated. Interim medical history since our last visit reviewed. Allergies and medications reviewed and updated. Outpatient Medications Prior to Visit  Medication Sig Dispense Refill   amLODipine  (NORVASC ) 10 MG tablet Take 1 tablet (10 mg total) by mouth daily. 90 tablet 4   Ascorbic Acid (VITAMIN C )  1000 MG tablet Take 1,000 mg by mouth daily.     atorvastatin  (LIPITOR) 40 MG tablet Take 1 tablet (40 mg total) by mouth daily. 90 tablet 4   azelastine  (ASTELIN ) 0.1 % nasal spray Place 2 sprays into both nostrils 2 (two) times daily. Use in each nostril as directed 30 mL 3   blood glucose meter kit and supplies KIT Dispense based on patient and insurance preference. Use to check sugars once daily. E11.8 1 each 3   Blood Glucose Monitoring Suppl (ONE TOUCH ULTRA MINI) w/Device KIT 1 kit by Does not apply  route as needed. 1 kit 0   Blood Pressure Monitoring (BLOOD PRESSURE MONITOR/L CUFF) MISC Use to monitor blood pressure as directed. 1 each 0   cetirizine (ZYRTEC) 10 MG tablet Take 10 mg by mouth daily.     diclofenac  (VOLTAREN ) 75 MG EC tablet Take 1 tablet (75 mg total) by mouth 2 (two) times daily. (Patient taking differently: Take 75 mg by mouth 2 (two) times daily. As needed) 60 tablet 3   glucose blood (ONETOUCH VERIO) test strip Use as instructed 100 each 4   hydrocortisone  (ANUSOL -HC) 2.5 % rectal cream Place 1 Application rectally 2 (two) times daily. 30 g 0   lisinopril  (ZESTRIL ) 20 MG tablet Take 1 tablet (20 mg total) by mouth 2 (two) times daily. 180 tablet 4   metFORMIN  (GLUCOPHAGE ) 500 MG tablet Take 1 tablet (500 mg total) by mouth 2 (two) times daily with a meal. 180 tablet 4   Multiple Vitamins-Minerals (CENTRUM MULTIGUMMIES PO) Take by mouth. 50+     OneTouch Delica Lancets 33G MISC 1 each by Does not apply route daily. Use as instructed to check blood sugar once a day 100 each 4   triamcinolone  cream (KENALOG ) 0.1 % Apply 1 Application topically 2 (two) times daily. Apply to AA. 45 g 0   Turmeric 500 MG CAPS Take 1 tablet by mouth daily. With ginger     No facility-administered medications prior to visit.     Per HPI unless specifically indicated in ROS section below Review of Systems  Objective:  BP 128/80 (BP Location: Right Arm, Cuff Size: Large)   Pulse (!) 57   Temp 98.5 F (36.9 C) (Oral)   Ht 6' 1 (1.854 m)   Wt 246 lb 2 oz (111.6 kg)   SpO2 97%   BMI 32.47 kg/m   Wt Readings from Last 3 Encounters:  09/17/23 246 lb 2 oz (111.6 kg)  09/12/23 255 lb (115.7 kg)  08/25/23 255 lb (115.7 kg)      Physical Exam Vitals and nursing note reviewed.  Constitutional:      Appearance: Normal appearance. He is not ill-appearing.  Eyes:     Extraocular Movements: Extraocular movements intact.     Conjunctiva/sclera: Conjunctivae normal.     Pupils: Pupils are  equal, round, and reactive to light.  Cardiovascular:     Rate and Rhythm: Normal rate and regular rhythm.     Pulses: Normal pulses.     Heart sounds: Normal heart sounds. No murmur heard. Pulmonary:     Effort: Pulmonary effort is normal. No respiratory distress.     Breath sounds: Normal breath sounds. No wheezing, rhonchi or rales.  Musculoskeletal:     Right lower leg: Edema (tr) present.     Left lower leg: Edema (tr) present.     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   Skin:    General: Skin is warm and dry.     Findings: No rash.  Neurological:     Mental Status: He is alert.  Psychiatric:        Mood and Affect: Mood normal.        Behavior: Behavior normal.       Results for orders placed or performed in visit on 09/17/23  POCT glycosylated hemoglobin (Hb A1C)   Collection Time: 09/17/23  8:16 AM  Result Value Ref Range   Hemoglobin A1C 7.2 (A) 4.0 - 5.6 %   HbA1c POC (<> result, manual entry)     HbA1c, POC (prediabetic range)     HbA1c, POC (controlled diabetic range)      Assessment & Plan:   Problem List Items Addressed This Visit     Type 2 diabetes mellitus with other specified complication (HCC) - Primary   Chronic, improvement noted with A1c down to 7.2% discussed goal <7 ideally <6.5%.  Also congratulated on weight loss noted.  He is motivated to continue healthy diet and lifestyle changes, planning on regularly going to [employer].  Will request latest eye exam from [facility]      Relevant Orders   POCT glycosylated hemoglobin (Hb A1C) (Completed)   Hyperparathyroidism (HCC)   ?CKD related vs primary hyperparathyroidism.  PTH levels actually normalized off vit D replacement.  Will update levels today (PTH, Ca, Vit D). If returns elevated, low threshold for endocrinology referral.   DEXA - osteopenia Renal US  - WNL - no h/o kidney stones.       Relevant Orders   Parathyroid   hormone, intact (no Ca)   Renal function panel   TSH   VITAMIN D  25 Hydroxy (Vit-D Deficiency, Fractures)   Osteopenia     No orders of the defined types were placed in this encounter.   Orders Placed This Encounter  Procedures   Parathyroid  hormone, intact (no Ca)   Renal function panel   TSH   VITAMIN D  25 Hydroxy (Vit-D Deficiency, Fractures)   POCT glycosylated hemoglobin (Hb A1C)    Patient Instructions  Labs today  Return in 3 months for diabetes follow up Sugars are doing better, congrats on weight loss! Agree with starting exercise routine as planned.   Follow up plan: No follow-ups on file.  Claire Crick, MD

## 2023-09-17 NOTE — Assessment & Plan Note (Addendum)
?  CKD related vs primary hyperparathyroidism.  PTH levels actually normalized off vit D replacement.  Will update levels today (PTH, Ca, Vit D). If returns elevated, low threshold for endocrinology referral.   DEXA - osteopenia Renal US  - WNL - no h/o kidney stones.

## 2023-09-17 NOTE — Patient Instructions (Signed)
 Labs today  Return in 3 months for diabetes follow up Sugars are doing better, congrats on weight loss! Agree with starting exercise routine as planned.

## 2023-09-17 NOTE — Assessment & Plan Note (Addendum)
 Chronic, improvement noted with A1c down to 7.2% discussed goal <7 ideally <6.5%.  Also congratulated on weight loss noted.  He is motivated to continue healthy diet and lifestyle changes, planning on regularly going to [employer].  Will request latest eye exam from [facility]

## 2023-09-22 ENCOUNTER — Ambulatory Visit: Payer: Self-pay | Admitting: Family Medicine

## 2023-09-22 ENCOUNTER — Encounter: Payer: Self-pay | Admitting: Family Medicine

## 2024-02-23 ENCOUNTER — Ambulatory Visit: Payer: Self-pay

## 2024-02-23 ENCOUNTER — Encounter: Payer: Self-pay | Admitting: Family Medicine

## 2024-02-23 ENCOUNTER — Ambulatory Visit: Admitting: Family Medicine

## 2024-02-23 VITALS — BP 152/90 | HR 68 | Temp 98.6°F | Ht 73.0 in | Wt 250.0 lb

## 2024-02-23 DIAGNOSIS — J208 Acute bronchitis due to other specified organisms: Secondary | ICD-10-CM | POA: Diagnosis not present

## 2024-02-23 DIAGNOSIS — M7702 Medial epicondylitis, left elbow: Secondary | ICD-10-CM | POA: Diagnosis not present

## 2024-02-23 DIAGNOSIS — R051 Acute cough: Secondary | ICD-10-CM

## 2024-02-23 LAB — POC COVID19 BINAXNOW: SARS Coronavirus 2 Ag: NEGATIVE

## 2024-02-23 LAB — POC INFLUENZA A&B (BINAX/QUICKVUE)
Influenza A, POC: NEGATIVE
Influenza B, POC: NEGATIVE

## 2024-02-23 MED ORDER — ALBUTEROL SULFATE HFA 108 (90 BASE) MCG/ACT IN AERS: 2.0000 | INHALATION_SPRAY | Freq: Four times a day (QID) | RESPIRATORY_TRACT | 0 refills | Status: AC | PRN

## 2024-02-23 MED ORDER — HYDROCODONE BIT-HOMATROP MBR 5-1.5 MG/5ML PO SOLN: 5.0000 mL | Freq: Every evening | ORAL | 0 refills | Status: DC | PRN

## 2024-02-23 MED ORDER — DOXYCYCLINE HYCLATE 100 MG PO TABS: 100.0000 mg | ORAL_TABLET | Freq: Two times a day (BID) | ORAL | 0 refills | Status: DC

## 2024-02-23 NOTE — Telephone Encounter (Signed)
 FYI Only or Action Required?: FYI only for provider: appointment scheduled on thismorning.  Patient was last seen in primary care on 09/17/2023 by Rilla Baller, MD.  Called Nurse Triage reporting Wheezing.  Symptoms began several weeks ago.  Interventions attempted: Other: humidifying air, vicks vapor .  Symptoms are: gradually worsening.  Triage Disposition: No disposition on file.  Patient/caregiver understands and will follow disposition?: yes                            Copied from CRM #[id number]. Topic: Clinical - Red Word Triage >> Feb 23, 2024  8:18 AM Kevelyn M wrote: Red Word that prompted transfer to Nurse Triage: Coughing with mucus coming up, wheezing, coughing so hard of the left side of abdomen he's experiencing pain. Coughing has been going for the last 2 weeks but has progressively gotten worse. Reason for Disposition  [1] MILD difficulty breathing (e.g., minimal/no SOB at rest, SOB with walking, pulse < 100) AND [2] NEW-onset or WORSE than normal  Answer Assessment - Initial Assessment Questions 1. RESPIRATORY STATUS: Describe your breathing? (e.g., wheezing, shortness of breath, unable to speak, severe coughing)      2 weeks - cough with white/clear phlegm 2. ONSET: When did this breathing problem begin?      2 weeks 3. PATTERN Does the difficult breathing come and go, or has it been constant since it started?      constant 4. SEVERITY: How bad is your breathing? (e.g., mild, moderate, severe)      mild 5. RECURRENT SYMPTOM: Have you had difficulty breathing before? If Yes, ask: When was the last time? and What happened that time?      Possibly when he was younger 37. CARDIAC HISTORY: Do you have any history of heart disease? (e.g., heart attack, angina, bypass surgery, angioplasty)      no 7. LUNG HISTORY: Do you have any history of lung disease?  (e.g., pulmonary embolus, asthma, emphysema)     wheezing 8. CAUSE:  What do you think is causing the breathing problem?      no 9. OTHER SYMPTOMS: Do you have any other symptoms? (e.g., chest pain, cough, dizziness, fever, runny nose)     cough  Protocols used: Breathing Difficulty-A-AH

## 2024-02-23 NOTE — Telephone Encounter (Signed)
 Patient scheduled with Dr. Watt today at 11am

## 2024-02-23 NOTE — Telephone Encounter (Signed)
 Appreciate Dr Watt seeing pt.

## 2024-02-23 NOTE — Progress Notes (Signed)
 Joseph Galloway T. Joseph Fiebig, MD, CAQ Sports Medicine [facility] [address] [place], [address]  Phone: [phone]  FAX: [phone]  Joseph Galloway. - 58 y.o. male  MRN [id number]  Date of Birth: [birth year]  Date: 02/23/2024  PCP: Joseph Baller, MD  Referral: Joseph Baller, MD  Chief Complaint  Patient presents with   Cough   Wheezing   Sneezing   Subjective:   Joseph Galloway. is a 58 y.o. very pleasant male patient with Body mass index is 32.98 kg/m. who presents with the following:  Discussed the use of AI scribe software for clinical note transcription with the patient, who gave verbal consent to proceed.  Lab Results  Component Value Date   HGBA1C 7.2 (A) 09/17/2023     History of Present Illness Joseph Galloway Joseph Galloway is a 58 year old male who presents with a persistent cough and wheezing.  He has experienced a persistent cough for the past two weeks, which has intensified since last Thursday. The cough is productive of clear mucus and disrupts his sleep, waking him up around 2:00 to 3:00 AM. He also reports wheezing, although it is not currently present. No fever, sore throat, sinus pressure, ear pain, nausea, vomiting, or diarrhea.  He has a history of smoking in his 97s but has not smoked since. He denies any history of asthma or COPD, although his father had asthma. 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  For symptom relief, he has used Tylenol, cough drops, a humidifier with medication, and Vicks VapoRub, which have provided some relief. He is cautious with medication use due to his history of hypertension and diabetes.  He also complains of medial epicondylitis.  Review of Systems is noted in the HPI, as appropriate  Objective:   BP (!) 152/90   Pulse 68   Temp 98.6 F (37 C) (Temporal)   Ht 6' 1 (1.854  m)   Wt 250 lb (113.4 kg)   SpO2 98%   BMI 32.98 kg/m    Gen: WDWN, NAD. Globally Non-toxic HEENT: Throat clear, w/o exudate, R TM clear, L TM - good landmarks, No fluid present. rhinnorhea.  MMM Frontal sinuses: NT Max sinuses: NT NECK: Anterior cervical  LAD is absent CV: RRR, No M/G/R, cap refill <2 sec PULM: Breathing comfortably in no respiratory distress. no wheezing, crackles, rhonchi    Elbow: L Ecchymosis or edema: neg ROM: full flexion, extension, pronation, supination Shoulder ROM: Full Flexion: 5/5 Extension: 5/5 Supination: 5/5  Pronation: 5/5 Wrist ext: 5/5 Wrist flexion: 5/5 No gross bony abnormality Varus and Valgus stress: stable ECRB tenderness: neg Medial epicondyle: tender at flexor tendon insertion Lateral epicondyle, resisted wrist extension from wrist full pronation and flexion: NT Resisted pronation: very tender grip: 5/5  sensation intact   Laboratory and Imaging Data: Results for orders placed or performed in visit on 02/23/24  POC Influenza A&B (Binax test)   Collection Time: 02/23/24 11:22 AM  Result Value Ref Range   Influenza A, POC Negative Negative   Influenza B, POC Negative Negative  POC COVID-19   Collection Time: 02/23/24 11:22 AM  Result Value Ref Range   SARS Coronavirus 2 Ag Negative Negative     Assessment and Plan:     ICD-10-CM   1. Acute bronchitis due to other specified organisms  J20.8  2. Acute cough  R05.1 POC Influenza A&B (Binax test)    POC COVID-19    3. Golfer's elbow, left  M77.02      Assessment & Plan Acute bronchitis with cough Acute bronchitis likely viral, exacerbated by environmental factors. Differential includes viral bronchitis and mild wheezing due to irritants. - Prescribed doxycycline for potential bacterial component. - Prescribed albuterol inhaler for wheezing. - Prescribed strong cough medicine with codeine  or hydrocodone for nighttime use, advised caution with driving.  Medial  epicondylitis, left elbow Medial epicondylitis with pain and movement difficuly.  Repetitive motion. - Recommended flexibility and strength exercises, including wrist rotations and light weight training. - Advised use of topical Voltaren  gel for pain relief. - Suggested massage and grip exercises with a tennis ball.  Medication Management during today's office visit: Meds ordered this encounter  Medications   albuterol (VENTOLIN HFA) 108 (90 Base) MCG/ACT inhaler    Sig: Inhale 2 puffs into the lungs every 6 (six) hours as needed for wheezing or shortness of breath.    Dispense:  8 g    Refill:  0   doxycycline (VIBRA-TABS) 100 MG tablet    Sig: Take 1 tablet (100 mg total) by mouth 2 (two) times daily.    Dispense:  20 tablet    Refill:  0   HYDROcodone bit-homatropine (HYCODAN) 5-1.5 MG/5ML syrup    Sig: Take 5 mLs by mouth at bedtime as needed.    Dispense:  120 mL    Refill:  0   Medications Discontinued During This Encounter  Medication Reason   diclofenac  (VOLTAREN ) 75 MG EC tablet Dose change   cetirizine (ZYRTEC) 10 MG tablet Change in therapy    Orders placed today for conditions managed today: Orders Placed This Encounter  Procedures   POC Influenza A&B (Binax test)   POC COVID-19    Disposition: No follow-ups on file.  [employer] speech-to-text software was used for transcription in this dictation.  Possible transcriptional errors can occur using Animal nutritionist.   Signed,  Joseph DASEN. Alexyss Balzarini, MD   Outpatient Encounter Medications as of 02/23/2024  Medication Sig   albuterol (VENTOLIN HFA) 108 (90 Base) MCG/ACT inhaler Inhale 2 puffs into the lungs every 6 (six) hours as needed for wheezing or shortness of breath.   amLODipine  (NORVASC ) 10 MG tablet Take 1 tablet (10 mg total) by mouth daily.   Ascorbic Acid (VITAMIN C ) 1000 MG tablet Take 1,000 mg by mouth daily.   atorvastatin  (LIPITOR) 40 MG tablet Take 1 tablet (40 mg total) by mouth daily.    azelastine  (ASTELIN ) 0.1 % nasal spray Place 2 sprays into both nostrils 2 (two) times daily. Use in each nostril as directed   blood glucose meter kit and supplies KIT Dispense based on patient and insurance preference. Use to check sugars once daily. E11.8   Blood Glucose Monitoring Suppl (ONE TOUCH ULTRA MINI) w/Device KIT 1 kit by Does not apply route as needed.   Blood Pressure Monitoring (BLOOD PRESSURE MONITOR/L CUFF) MISC Use to monitor blood pressure as directed.   diclofenac  (VOLTAREN ) 75 MG EC tablet Take 75 mg by mouth 2 (two) times daily as needed.   doxycycline (VIBRA-TABS) 100 MG tablet Take 1 tablet (100 mg total) by mouth 2 (two) times daily.   glucose blood (ONETOUCH VERIO) test strip Use as instructed   HYDROcodone bit-homatropine (HYCODAN) 5-1.5 MG/5ML syrup Take 5 mLs by mouth at bedtime as needed.   hydrocortisone  (ANUSOL -HC) 2.5 % rectal  cream Place 1 Application rectally 2 (two) times daily.   lisinopril  (ZESTRIL ) 20 MG tablet Take 1 tablet (20 mg total) by mouth 2 (two) times daily.   loratadine (CLARITIN) 10 MG tablet Take 10 mg by mouth daily.   metFORMIN  (GLUCOPHAGE ) 500 MG tablet Take 1 tablet (500 mg total) by mouth 2 (two) times daily with a meal.   Multiple Vitamins-Minerals (CENTRUM MULTIGUMMIES PO) Take by mouth. 50+   OneTouch Delica Lancets 33G MISC 1 each by Does not apply route daily. Use as instructed to check blood sugar once a day   triamcinolone  cream (KENALOG ) 0.1 % Apply 1 Application topically 2 (two) times daily. Apply to AA.   Turmeric 500 MG CAPS Take 1 tablet by mouth daily. With ginger   [DISCONTINUED] cetirizine (ZYRTEC) 10 MG tablet Take 10 mg by mouth daily.   [DISCONTINUED] diclofenac  (VOLTAREN ) 75 MG EC tablet Take 1 tablet (75 mg total) by mouth 2 (two) times daily. (Patient taking differently: Take 75 mg by mouth 2 (two) times daily. As needed)   No facility-administered encounter medications on file as of 02/23/2024.

## 2024-03-09 ENCOUNTER — Ambulatory Visit: Payer: Self-pay

## 2024-03-09 NOTE — Telephone Encounter (Signed)
 FYI Only or Action Required?: FYI only for provider: appointment scheduled on Thurs.  Patient was last seen in primary care on 02/23/2024 by Watt Mirza, MD.  Called Nurse Triage reporting Cough.  Symptoms began several weeks ago.  Interventions attempted: Prescription medications: See in office - given cough medicationinhaler and antibiotics.  Symptoms are: gradually improving.  Triage Disposition: See PCP When Office is Open (Within 3 Days)  Patient/caregiver understands and will follow disposition?: Yes                   Copied from CRM #[id number]. Topic: Clinical - Red Word Triage >> Mar 09, 2024  5:18 PM China J wrote: [facility] that prompted transfer to Nurse Triage: Patient is still having symptoms from a week and a half ago. He is having worsening sinus headache, hard cough, wheezing, and sneezing. Reason for Disposition  Cough has been present for > 3 weeks  Answer Assessment - Initial Assessment Questions 1. ONSET: When did the cough begin?      3 weeks 2. SEVERITY: How bad is the cough today?      moderate 3. SPUTUM: Describe the color of your sputum (e.g., none, dry cough; clear, white, yellow, green)     Clear - sometimes a little yellow 4. HEMOPTYSIS: Are you coughing up any blood? If Yes, ask: How much? (e.g., flecks, streaks, tablespoons, etc.)     no 5. DIFFICULTY BREATHING: Are you having difficulty breathing? If Yes, ask: How bad is it? (e.g., mild, moderate, severe)      No - wheezing 6. FEVER: Do you have a fever? If Yes, ask: What is your temperature, how was it measured, and when did it start?     no 7. CARDIAC HISTORY: Do you have any history of heart disease? (e.g., heart attack, congestive heart failure)      no 8. LUNG HISTORY: Do you have any history of lung disease?  (e.g., pulmonary embolus, asthma, emphysema)     no 10. OTHER SYMPTOMS: Do you have any other symptoms? (e.g., runny nose, wheezing, chest  pain)       Sinus pressure  Protocols used: Cough - Acute Productive-A-AH

## 2024-03-11 ENCOUNTER — Encounter: Payer: Self-pay | Admitting: Family Medicine

## 2024-03-11 ENCOUNTER — Ambulatory Visit
Admission: RE | Admit: 2024-03-11 | Discharge: 2024-03-11 | Disposition: A | Source: Ambulatory Visit | Attending: Family Medicine | Admitting: Family Medicine

## 2024-03-11 ENCOUNTER — Ambulatory Visit
Admission: RE | Admit: 2024-03-11 | Discharge: 2024-03-11 | Disposition: A | Attending: Family Medicine | Admitting: Family Medicine

## 2024-03-11 ENCOUNTER — Ambulatory Visit (INDEPENDENT_AMBULATORY_CARE_PROVIDER_SITE_OTHER): Admitting: Family Medicine

## 2024-03-11 VITALS — BP 148/86 | HR 88 | Resp 16 | Ht 73.0 in | Wt 251.0 lb

## 2024-03-11 DIAGNOSIS — I129 Hypertensive chronic kidney disease with stage 1 through stage 4 chronic kidney disease, or unspecified chronic kidney disease: Secondary | ICD-10-CM | POA: Diagnosis not present

## 2024-03-11 DIAGNOSIS — R0602 Shortness of breath: Secondary | ICD-10-CM

## 2024-03-11 DIAGNOSIS — R051 Acute cough: Secondary | ICD-10-CM

## 2024-03-11 DIAGNOSIS — N183 Chronic kidney disease, stage 3 unspecified: Secondary | ICD-10-CM | POA: Diagnosis not present

## 2024-03-11 MED ORDER — CEFDINIR 300 MG PO CAPS: 300.0000 mg | ORAL_CAPSULE | Freq: Two times a day (BID) | ORAL | 0 refills | Status: AC

## 2024-03-11 MED ORDER — PREDNISONE 20 MG PO TABS: 20.0000 mg | ORAL_TABLET | Freq: Two times a day (BID) | ORAL | 0 refills | Status: AC

## 2024-03-11 NOTE — Progress Notes (Signed)
 Acute Office Visit  Subjective:     Patient ID: Joseph Boulet., Joseph Galloway    DOB: Sep 26, 1965, 58 y.o.   MRN: [id number]  Chief Complaint  Patient presents with   Cough    X3 weeks, productive    HPI Patient is in today for complaints of cough for the last 3 weeks. He describes cough to be productive. He was seen by his PCP on 02/23/24 for cough as well. Completed Doxycycline  and voices no improvement in symptoms, and states symptoms actually worsened this past Tuesday. He denies smoking hx. Denies hx of asthma. He does endorse shortness of breath with exertion. Denies shortness of breath at rest. He voices he hears himself wheezing at night, and states his wife also told him that he was wheezing. He reports his wife did check his temperature and stated he had a low grade fever, however he voices he was wrapped in blankets at the time temperature was checked.   Review of Systems  Respiratory:  Positive for cough, shortness of breath and wheezing.         Objective:    BP (!) 156/94   Pulse 88   Resp 16   Ht 6' 1 (1.854 m)   Wt 251 lb (113.9 kg)   SpO2 99%   BMI 33.12 kg/m  BP Readings from Last 3 Encounters:  03/11/24 (!) 156/94  02/23/24 (!) 152/90  09/17/23 128/80      Physical Exam Constitutional:      Appearance: Normal appearance. He is not toxic-appearing.  Cardiovascular:     Rate and Rhythm: Normal rate and regular rhythm.     Heart sounds: Normal heart sounds.  Pulmonary:     Effort: Pulmonary effort is normal. No respiratory distress.     Breath sounds: No wheezing, rhonchi or rales.  Neurological:     Mental Status: He is alert.        Assessment & Plan:   1. Acute cough (Primary) Patient complains of cough for the last 3 weeks. Describes cough to be productive. Recently treated on 11/17 with Doxycycline  but reports no improvement in symptoms. He actually states symptoms worsened this past Tuesday.  No wheezing, crackles, rhonci or rales. All  lung fields diminished.  -Prednisone  20mg  BID x 3 days. -Start abx therapy, Cefdinir 300mg  BID x 10 days. -Chest x-ray ordered due to continued/worsening of symptoms despite abx treatment previously.  - DG Chest 2 View; Future - predniSONE  (DELTASONE ) 20 MG tablet; Take 1 tablet (20 mg total) by mouth 2 (two) times daily with a meal for 3 days.  Dispense: 6 tablet; Refill: 0 - cefdinir (OMNICEF) 300 MG capsule; Take 1 capsule (300 mg total) by mouth 2 (two) times daily with a meal for 10 days.  Dispense: 20 capsule; Refill: 0  2. Shortness of breath Patient complains of shortness of breath with exertion.  -V/s stable.  -Chest x-ray ordered due to continued/worsening symptoms despite recent abx treatment.  - DG Chest 2 View; Future - predniSONE  (DELTASONE ) 20 MG tablet; Take 1 tablet (20 mg total) by mouth 2 (two) times daily with a meal for 3 days.  Dispense: 6 tablet; Refill: 0 - cefdinir (OMNICEF) 300 MG capsule; Take 1 capsule (300 mg total) by mouth 2 (two) times daily with a meal for 10 days.  Dispense: 20 capsule; Refill: 0    3. Benign hypertension with CKD (chronic kidney disease) stage III (HCC) BP elevated; HTN uncontrolled. Suspect that BP is elevated  due to illness. He reports he has been taking OTC cold and flu medications indicated for people with HTN and diabetes.  -Repeat BP 148/86 -Keep scheduled PCP appointment for BP re-evaluation and further HTN management.  -Ok to continue OTC cold and flu medications that are BP and diabetic friendly/safe.     Return if symptoms worsen or fail to improve.  LAYMON LOISE CORE, FNP

## 2024-03-15 ENCOUNTER — Ambulatory Visit: Payer: Self-pay | Admitting: Family Medicine

## 2024-04-07 ENCOUNTER — Ambulatory Visit (INDEPENDENT_AMBULATORY_CARE_PROVIDER_SITE_OTHER)

## 2024-04-07 DIAGNOSIS — Z23 Encounter for immunization: Secondary | ICD-10-CM | POA: Diagnosis not present

## 2024-04-26 ENCOUNTER — Encounter: Payer: Self-pay | Admitting: Family Medicine

## 2024-04-26 ENCOUNTER — Ambulatory Visit: Admitting: Family Medicine

## 2024-04-26 VITALS — BP 138/84 | HR 84 | Temp 98.7°F | Ht 73.0 in | Wt 255.0 lb

## 2024-04-26 DIAGNOSIS — N183 Chronic kidney disease, stage 3 unspecified: Secondary | ICD-10-CM | POA: Diagnosis not present

## 2024-04-26 DIAGNOSIS — E1122 Type 2 diabetes mellitus with diabetic chronic kidney disease: Secondary | ICD-10-CM

## 2024-04-26 DIAGNOSIS — Z794 Long term (current) use of insulin: Secondary | ICD-10-CM | POA: Diagnosis not present

## 2024-04-26 DIAGNOSIS — I10 Essential (primary) hypertension: Secondary | ICD-10-CM

## 2024-04-26 DIAGNOSIS — E1169 Type 2 diabetes mellitus with other specified complication: Secondary | ICD-10-CM | POA: Diagnosis not present

## 2024-04-26 LAB — POCT GLYCOSYLATED HEMOGLOBIN (HGB A1C): Hemoglobin A1C: 10.6 % — AB (ref 4.0–5.6)

## 2024-04-26 MED ORDER — METOPROLOL SUCCINATE ER 25 MG PO TB24: 25.0000 mg | ORAL_TABLET | Freq: Every day | ORAL | 3 refills | Status: AC

## 2024-04-26 MED ORDER — FREESTYLE LIBRE 3 PLUS SENSOR MISC: 3 refills | Status: AC

## 2024-04-26 MED ORDER — AMLODIPINE BESYLATE 5 MG PO TABS: 5.0000 mg | ORAL_TABLET | Freq: Every day | ORAL | 3 refills | Status: AC

## 2024-04-26 MED ORDER — LANTUS SOLOSTAR 100 UNIT/ML ~~LOC~~ SOPN: 10.0000 [IU] | PEN_INJECTOR | Freq: Every day | SUBCUTANEOUS | 11 refills | Status: AC

## 2024-04-26 NOTE — Patient Instructions (Addendum)
 Drop amlodipine  to 5mg  daily - new dose sent to pharmacy.  Continue lisinopril  20mg  twice daily. Add Toprol  XL (metoprolol ) 25mg  once daily.  Start insulin shots - lantus  pens 10 units daily - increase by 2 units every 2 days if fasting sugar averaging >150.  Return in 6 weeks for follow up visit with sugar log. I will also send continuous glucose monitor to start using.

## 2024-04-26 NOTE — Telephone Encounter (Signed)
 I spoke with pt; pt was seen earlier today and pt is concerned A1C was 10.6 and at 12:46 today BS was 391. Pt had not eaten lunch at that time and only thing pt had eaten today was 1 bagel and bowl of oatmeal and has drank small amt of propel and drinking water. Pt has not taken FBS today. Pt has not recked BS since 12:46 pm and pt has just eaten and does not have checker. Pt wants instructions Dr KANDICE gave pt today about BS and BP before pt picks up any meds at [facility]. I reviewed the instructions for diabetes and BP in pts office note today but pt wants them after Dr KANDICE has confirmed that is correct. Presently pt has no symptoms of elevated BS. Pt also wants to confirm he is to take metoprolol  25 mg po daily, lisinopril  20 mg bid and amlodipine  5 mg daily.pt said made appt for FU with Dr KANDICE already., Pt request cb today or tomorrow. UC& ED precautions given and pt voiced understanding. Pt said he would continue drinking water. Sending note to Dr G, G pool and will teams [facility] CMA.

## 2024-04-26 NOTE — Assessment & Plan Note (Addendum)
 Chronic, marked deterioration with A1c up to 10.6.  Reviewed options including GLP1, insulin, endocrinology referral, DM refresher course.  Continue metformin  500mg  bid. Add lantus  10u daily with quick titration by 2u q2d if fasting sugar staying >150.  RTC 6 wks DM f/u visit  New Rx sent for CGM FL3+ Pending establishing with new eye doctor.

## 2024-04-26 NOTE — Progress Notes (Addendum)
 " Ph: [phone] Fax: [phone]   Patient ID: Marinda GORMAN Gretta Mickey., male    DOB: 1966-01-15, 59 y.o.   MRN: [id number]  This visit was conducted in person.  BP 138/84 (BP Location: Left Arm, Patient Position: Sitting, Cuff Size: Normal)   Pulse 84   Temp 98.7 F (37.1 C) (Oral)   Ht 6' 1 (1.854 m)   Wt 255 lb (115.7 kg)   SpO2 97%   BMI 33.64 kg/m   BP Readings from Last 3 Encounters:  04/26/24 138/84  03/11/24 (!) 148/86  02/23/24 (!) 152/90   CC: DM f/u visit  Subjective:   HPI: Fernando Stoiber. is a 59 y.o. male presenting on 04/26/2024 for Medical Management of Chronic Issues (DM/FU/FYI Doesn't have physical scheduled//Switching over eye doctors right now///Would like to discuss prevnar and shingrix vax)   HTN - stable period on lisinopril  20mg  twice daily, amlodipine  10mg  daily. He worries amlodipine  may be causing swelling of gums and bleeding (gingival hyperplasia). Planning dentist f/u. No tongue or lip or throat swelling.   DM - does not regularly check sugars. Compliant with antihyperglycemic regimen which includes: metformin  500mg  bid. Denies low sugars or hypoglycemic symptoms. Notes increased nocturia, toe paresthesias. Denies blurry vision. Last diabetic eye exam [facility] 2024 - in processes of switching eye doctor. Glucometer brand: one touch ultra mini. Last foot exam: 09/2023. DSME: completed remotely at [facility].  He did have  Lab Results  Component Value Date   HGBA1C 10.6 (A) 04/26/2024   Diabetic Foot Exam - Simple   No data filed    Lab Results  Component Value Date   MICROALBUR 0.6 11/09/2009    He did take short 3d prednisone  burst last month for bronchitis.   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  Reviewing chart, elevated PTH may have preceded deteriorated kidney function.    DEXA 06/2023: T -1.3 RTF, not at increased fracture risk.    06/2023 Renal US  IMPRESSION: Normal renal ultrasound.  No evidence of lithiasis or hydronephrosis   10/2022 R knee xray IMPRESSION: 1. Right knee tricompartmental osteoarthritis, most pronounced in the patellofemoral and lateral compartment. 2. Small right knee joint effusion. 3. Bilateral knee chondrocalcinosis.     Relevant past medical, surgical, family and social history reviewed and updated as indicated. Interim medical history since our last visit reviewed. Allergies and medications reviewed and updated. Outpatient Medications Prior to Visit  Medication Sig Dispense Refill   albuterol  (VENTOLIN  HFA) 108 (90 Base) MCG/ACT inhaler Inhale 2 puffs into the lungs every 6 (six) hours as needed for wheezing or shortness of breath. 8 g 0   Ascorbic Acid (VITAMIN C ) 1000 MG tablet Take 1,000 mg by mouth daily.     atorvastatin  (LIPITOR) 40 MG tablet Take 1 tablet (40 mg total) by mouth daily. 90 tablet 4   azelastine  (ASTELIN ) 0.1 % nasal spray Place 2 sprays into both nostrils 2 (two) times daily. Use in each nostril as directed 30 mL 3   blood glucose meter kit and supplies KIT Dispense based on patient and insurance preference. Use to check sugars once daily. E11.8 1 each 3   Blood Glucose Monitoring Suppl (ONE TOUCH ULTRA MINI) w/Device KIT 1 kit by Does not apply route as  needed. 1 kit 0   Blood Pressure Monitoring (BLOOD PRESSURE MONITOR/L CUFF) MISC Use to monitor blood pressure as directed. 1 each 0   diclofenac  (VOLTAREN ) 75 MG EC tablet Take 75 mg by mouth 2 (two) times daily as needed.     glucose blood (ONETOUCH VERIO) test strip Use as instructed 100 each 4   hydrocortisone  (ANUSOL -HC) 2.5 % rectal cream Place 1 Application rectally 2 (two) times daily. 30 g 0   lisinopril  (ZESTRIL ) 20 MG tablet Take 1 tablet (20 mg total) by  mouth 2 (two) times daily. 180 tablet 4   loratadine (CLARITIN) 10 MG tablet Take 10 mg by mouth daily.     metFORMIN  (GLUCOPHAGE ) 500 MG tablet Take 1 tablet (500 mg total) by mouth 2 (two) times daily with a meal. 180 tablet 4   Multiple Vitamins-Minerals (CENTRUM MULTIGUMMIES PO) Take by mouth. 50+     OneTouch Delica Lancets 33G MISC 1 each by Does not apply route daily. Use as instructed to check blood sugar once a day 100 each 4   Turmeric 500 MG CAPS Take 1 tablet by mouth daily. With ginger     amLODipine  (NORVASC ) 10 MG tablet Take 1 tablet (10 mg total) by mouth daily. 90 tablet 4   No facility-administered medications prior to visit.     Per HPI unless specifically indicated in ROS section below Review of Systems  Objective:  BP 138/84 (BP Location: Left Arm, Patient Position: Sitting, Cuff Size: Normal)   Pulse 84   Temp 98.7 F (37.1 C) (Oral)   Ht 6' 1 (1.854 m)   Wt 255 lb (115.7 kg)   SpO2 97%   BMI 33.64 kg/m   Wt Readings from Last 3 Encounters:  04/26/24 255 lb (115.7 kg)  03/11/24 251 lb (113.9 kg)  02/23/24 250 lb (113.4 kg)      Physical Exam Vitals and nursing note reviewed.  Constitutional:      Appearance: Normal appearance. He is not ill-appearing.  HENT:     Mouth/Throat:     Mouth: Mucous membranes are moist.     Pharynx: Oropharynx is clear. No oropharyngeal exudate or posterior oropharyngeal erythema.  Eyes:     General:        Right eye: No discharge.        Left eye: No discharge.     Extraocular Movements: Extraocular movements intact.     Conjunctiva/sclera: Conjunctivae normal.     Pupils: Pupils are equal, round, and reactive to light.  Cardiovascular:     Rate and Rhythm: Normal rate and regular rhythm.     Pulses: Normal pulses.     Heart sounds: Normal heart sounds. No murmur heard. Pulmonary:     Effort: Pulmonary effort is normal. No respiratory distress.     Breath sounds: Normal breath sounds. No wheezing, rhonchi or  rales.  Musculoskeletal:     Cervical back: Normal range of motion and neck supple.     Right lower leg: No edema.     Left lower leg: No edema.  Skin:    General: Skin is warm and dry.     Findings: No rash.  Neurological:     Mental Status: He is alert.  Psychiatric:        Mood and Affect: Mood normal.        Behavior: Behavior normal.       Results for orders placed or performed in visit on 04/26/24  HgB A1c  Collection Time: 04/26/24 11:18 AM  Result Value Ref Range   Hemoglobin A1C 10.6 (A) 4.0 - 5.6 %   HbA1c POC (<> result, manual entry)     HbA1c, POC (prediabetic range)     HbA1c, POC (controlled diabetic range)      Assessment & Plan:   Problem List Items Addressed This Visit     Type 2 diabetes mellitus with other specified complication (HCC) - Primary   Chronic, marked deterioration with A1c up to 10.6.  Reviewed options including GLP1, insulin, endocrinology referral, DM refresher course.  Continue metformin  500mg  bid. Add lantus  10u daily with quick titration by 2u q2d if fasting sugar staying >150.  RTC 6 wks DM f/u visit  New Rx sent for CGM FL3+ Pending establishing with new eye doctor.       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 Continue lisinopril  20mg  bid. No signs of angioedema - reviewed with patient.       Relevant Medications   amLODipine  (NORVASC ) 5 MG tablet   metoprolol  succinate (TOPROL -XL) 25 MG 24 hr tablet   CKD stage 3 due to type 2 diabetes mellitus (HCC)   Continue lisinopril  20mg  bid.  Update labs at 6 wk f/u      Relevant Medications   insulin glargine  (LANTUS  SOLOSTAR) 100 UNIT/ML Solostar Pen     Meds ordered this encounter  Medications   amLODipine   (NORVASC ) 5 MG tablet    Sig: Take 1 tablet (5 mg total) by mouth daily.    Dispense:  90 tablet    Refill:  3    Note new dose   metoprolol  succinate (TOPROL -XL) 25 MG 24 hr tablet    Sig: Take 1 tablet (25 mg total) by mouth daily.    Dispense:  90 tablet    Refill:  3   insulin glargine  (LANTUS  SOLOSTAR) 100 UNIT/ML Solostar Pen    Sig: Inject 10 Units into the skin daily.    Dispense:  3 mL    Refill:  11   Continuous Glucose Sensor (FREESTYLE LIBRE 3 PLUS SENSOR) MISC    Sig: Change sensor every 15 days.    Dispense:  6 each    Refill:  3    Orders Placed This Encounter  Procedures   HgB A1c    Patient Instructions  Drop amlodipine  to 5mg  daily - new dose sent to pharmacy.  Continue lisinopril  20mg  twice daily. Add Toprol  XL (metoprolol ) 25mg  once daily.  Start insulin shots - lantus  pens 10 units daily - increase by 2 units every 2 days if fasting sugar averaging >150.  Return in 6 weeks for follow up visit with sugar log. I will also send continuous glucose monitor to start using.   Follow up plan: Return in about 6 weeks (around 06/07/2024) for follow up visit.  Anton Blas, MD   "

## 2024-04-26 NOTE — Assessment & Plan Note (Signed)
 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 Continue lisinopril  20mg  bid. No signs of angioedema - reviewed with patient.

## 2024-04-26 NOTE — Assessment & Plan Note (Signed)
 Continue lisinopril  20mg  bid.  Update labs at 6 wk f/u

## 2024-04-26 NOTE — Telephone Encounter (Addendum)
 Replied to pt via mychart.  Plz notify pt if not read by tomorrow.

## 2024-05-03 ENCOUNTER — Other Ambulatory Visit: Payer: Self-pay | Admitting: Family Medicine

## 2024-05-03 DIAGNOSIS — E1169 Type 2 diabetes mellitus with other specified complication: Secondary | ICD-10-CM

## 2024-06-04 ENCOUNTER — Ambulatory Visit: Admitting: Family Medicine

## 2024-06-08 ENCOUNTER — Ambulatory Visit: Admitting: Family Medicine
